# Patient Record
Sex: Female | Born: 1945 | Race: Black or African American | Hispanic: No | Marital: Single | Smoking: Current some day smoker
Health system: Southern US, Community
[De-identification: ages and names within clinical notes are randomized; demographics above are authoritative.]

## PROBLEM LIST (undated history)

## (undated) DIAGNOSIS — I2510 Atherosclerotic heart disease of native coronary artery without angina pectoris: Secondary | ICD-10-CM

## (undated) DIAGNOSIS — I6529 Occlusion and stenosis of unspecified carotid artery: Secondary | ICD-10-CM

## (undated) DIAGNOSIS — I219 Acute myocardial infarction, unspecified: Secondary | ICD-10-CM

## (undated) DIAGNOSIS — E119 Type 2 diabetes mellitus without complications: Secondary | ICD-10-CM

## (undated) DIAGNOSIS — E785 Hyperlipidemia, unspecified: Secondary | ICD-10-CM

## (undated) DIAGNOSIS — I10 Essential (primary) hypertension: Secondary | ICD-10-CM

## (undated) HISTORY — PX: CARDIAC CATHETERIZATION: SHX172

## (undated) HISTORY — DX: Acute myocardial infarction, unspecified: I21.9

## (undated) HISTORY — PX: ABDOMINAL HYSTERECTOMY: SHX81

## (undated) HISTORY — DX: Hyperlipidemia, unspecified: E78.5

## (undated) HISTORY — DX: Occlusion and stenosis of unspecified carotid artery: I65.29

## (undated) HISTORY — DX: Essential (primary) hypertension: I10

## (undated) HISTORY — DX: Type 2 diabetes mellitus without complications: E11.9

## (undated) HISTORY — DX: Atherosclerotic heart disease of native coronary artery without angina pectoris: I25.10

---

## 1995-02-06 DIAGNOSIS — I219 Acute myocardial infarction, unspecified: Secondary | ICD-10-CM

## 1995-02-06 HISTORY — DX: Acute myocardial infarction, unspecified: I21.9

## 1997-09-05 ENCOUNTER — Emergency Department (HOSPITAL_COMMUNITY): Admission: EM | Admit: 1997-09-05 | Discharge: 1997-09-05 | Payer: Self-pay | Admitting: Emergency Medicine

## 2003-02-01 ENCOUNTER — Emergency Department (HOSPITAL_COMMUNITY): Admission: EM | Admit: 2003-02-01 | Discharge: 2003-02-01 | Payer: Self-pay | Admitting: Emergency Medicine

## 2006-02-25 ENCOUNTER — Emergency Department (HOSPITAL_COMMUNITY): Admission: EM | Admit: 2006-02-25 | Discharge: 2006-02-25 | Payer: Self-pay | Admitting: Emergency Medicine

## 2006-04-08 ENCOUNTER — Ambulatory Visit (HOSPITAL_COMMUNITY): Admission: RE | Admit: 2006-04-08 | Discharge: 2006-04-08 | Payer: Self-pay | Admitting: Chiropractic Medicine

## 2007-01-09 ENCOUNTER — Emergency Department (HOSPITAL_COMMUNITY): Admission: EM | Admit: 2007-01-09 | Discharge: 2007-01-09 | Payer: Self-pay | Admitting: Emergency Medicine

## 2007-01-15 ENCOUNTER — Ambulatory Visit: Payer: Self-pay | Admitting: *Deleted

## 2007-05-13 ENCOUNTER — Encounter (INDEPENDENT_AMBULATORY_CARE_PROVIDER_SITE_OTHER): Payer: Self-pay | Admitting: Nurse Practitioner

## 2007-05-13 ENCOUNTER — Ambulatory Visit: Payer: Self-pay | Admitting: Family Medicine

## 2007-05-13 LAB — CONVERTED CEMR LAB
ALT: 9 units/L (ref 0–35)
AST: 14 units/L (ref 0–37)
Albumin: 4.5 g/dL (ref 3.5–5.2)
Alkaline Phosphatase: 47 units/L (ref 39–117)
BUN: 16 mg/dL (ref 6–23)
Basophils Absolute: 0 10*3/uL (ref 0.0–0.1)
Basophils Relative: 1 % (ref 0–1)
CK-MB: 1.7 ng/mL (ref 0.3–4.0)
CO2: 22 meq/L (ref 19–32)
Calcium: 9.7 mg/dL (ref 8.4–10.5)
Chloride: 104 meq/L (ref 96–112)
Creatinine, Ser: 0.74 mg/dL (ref 0.40–1.20)
Eosinophils Absolute: 0.1 10*3/uL (ref 0.0–0.7)
Eosinophils Relative: 1 % (ref 0–5)
Glucose, Bld: 96 mg/dL (ref 70–99)
HCT: 41.6 % (ref 36.0–46.0)
Hemoglobin: 13.7 g/dL (ref 12.0–15.0)
Lymphocytes Relative: 47 % — ABNORMAL HIGH (ref 12–46)
Lymphs Abs: 2.9 10*3/uL (ref 0.7–4.0)
MCHC: 32.9 g/dL (ref 30.0–36.0)
MCV: 85.8 fL (ref 78.0–100.0)
Monocytes Absolute: 0.4 10*3/uL (ref 0.1–1.0)
Monocytes Relative: 6 % (ref 3–12)
Neutro Abs: 2.7 10*3/uL (ref 1.7–7.7)
Neutrophils Relative %: 45 % (ref 43–77)
Platelets: 382 10*3/uL (ref 150–400)
Potassium: 4.1 meq/L (ref 3.5–5.3)
RBC: 4.85 M/uL (ref 3.87–5.11)
RDW: 14.8 % (ref 11.5–15.5)
Sodium: 139 meq/L (ref 135–145)
TSH: 0.895 microintl units/mL (ref 0.350–5.50)
Total Bilirubin: 0.2 mg/dL — ABNORMAL LOW (ref 0.3–1.2)
Total CK: 50 units/L (ref 7–177)
Total Protein: 8.1 g/dL (ref 6.0–8.3)
WBC: 6.1 10*3/uL (ref 4.0–10.5)

## 2007-05-28 ENCOUNTER — Ambulatory Visit (HOSPITAL_COMMUNITY): Admission: RE | Admit: 2007-05-28 | Discharge: 2007-05-28 | Payer: Self-pay | Admitting: Family Medicine

## 2007-05-28 ENCOUNTER — Ambulatory Visit: Payer: Self-pay | Admitting: Cardiology

## 2007-05-28 ENCOUNTER — Encounter (INDEPENDENT_AMBULATORY_CARE_PROVIDER_SITE_OTHER): Payer: Self-pay | Admitting: Family Medicine

## 2007-07-29 ENCOUNTER — Ambulatory Visit (HOSPITAL_COMMUNITY): Admission: RE | Admit: 2007-07-29 | Discharge: 2007-07-29 | Payer: Self-pay | Admitting: Family Medicine

## 2007-08-07 ENCOUNTER — Encounter: Admission: RE | Admit: 2007-08-07 | Discharge: 2007-08-07 | Payer: Self-pay | Admitting: Family Medicine

## 2007-08-20 ENCOUNTER — Ambulatory Visit: Payer: Self-pay | Admitting: Internal Medicine

## 2007-08-20 ENCOUNTER — Encounter (INDEPENDENT_AMBULATORY_CARE_PROVIDER_SITE_OTHER): Payer: Self-pay | Admitting: Family Medicine

## 2007-08-21 ENCOUNTER — Encounter: Payer: Self-pay | Admitting: Family Medicine

## 2007-09-02 ENCOUNTER — Ambulatory Visit: Payer: Self-pay | Admitting: Internal Medicine

## 2007-09-04 ENCOUNTER — Ambulatory Visit: Payer: Self-pay | Admitting: Internal Medicine

## 2007-09-22 ENCOUNTER — Ambulatory Visit: Payer: Self-pay | Admitting: Internal Medicine

## 2007-09-22 ENCOUNTER — Encounter (INDEPENDENT_AMBULATORY_CARE_PROVIDER_SITE_OTHER): Payer: Self-pay | Admitting: Family Medicine

## 2007-09-22 LAB — CONVERTED CEMR LAB
ALT: 11 units/L (ref 0–35)
AST: 12 units/L (ref 0–37)
Albumin: 4.4 g/dL (ref 3.5–5.2)
Alkaline Phosphatase: 39 units/L (ref 39–117)
BUN: 13 mg/dL (ref 6–23)
CO2: 24 meq/L (ref 19–32)
Calcium: 9.3 mg/dL (ref 8.4–10.5)
Chloride: 100 meq/L (ref 96–112)
Cholesterol: 272 mg/dL — ABNORMAL HIGH (ref 0–200)
Creatinine, Ser: 0.8 mg/dL (ref 0.40–1.20)
Glucose, Bld: 107 mg/dL — ABNORMAL HIGH (ref 70–99)
HDL: 35 mg/dL — ABNORMAL LOW (ref >39)
LDL Cholesterol: 199 mg/dL — ABNORMAL HIGH (ref 0–99)
Microalb, Ur: 0.55 mg/dL (ref 0.00–1.89)
Potassium: 4.2 meq/L (ref 3.5–5.3)
Sodium: 138 meq/L (ref 135–145)
Total Bilirubin: 0.4 mg/dL (ref 0.3–1.2)
Total CHOL/HDL Ratio: 7.8
Total Protein: 7.8 g/dL (ref 6.0–8.3)
Triglycerides: 188 mg/dL — ABNORMAL HIGH (ref <150)
VLDL: 38 mg/dL (ref 0–40)

## 2008-08-05 DEATH — deceased

## 2009-08-16 ENCOUNTER — Encounter: Admission: RE | Admit: 2009-08-16 | Discharge: 2009-08-16 | Payer: Self-pay | Admitting: Internal Medicine

## 2010-02-26 ENCOUNTER — Encounter: Payer: Self-pay | Admitting: Internal Medicine

## 2010-02-26 ENCOUNTER — Encounter: Payer: Self-pay | Admitting: Chiropractic Medicine

## 2010-04-05 ENCOUNTER — Other Ambulatory Visit: Payer: Self-pay | Admitting: Internal Medicine

## 2010-04-05 DIAGNOSIS — Z1231 Encounter for screening mammogram for malignant neoplasm of breast: Secondary | ICD-10-CM

## 2010-08-18 ENCOUNTER — Ambulatory Visit
Admission: RE | Admit: 2010-08-18 | Discharge: 2010-08-18 | Disposition: A | Discharge status: Home / Self Care | Payer: Medicaid Other | Source: Ambulatory Visit | Attending: Internal Medicine | Admitting: Internal Medicine

## 2010-08-18 DIAGNOSIS — Z1231 Encounter for screening mammogram for malignant neoplasm of breast: Secondary | ICD-10-CM

## 2010-11-13 LAB — I-STAT 8, (EC8 V) (CONVERTED LAB)
Acid-base deficit: 3 — ABNORMAL HIGH
BUN: 11
Bicarbonate: 22.7
Chloride: 108
Glucose, Bld: 111 — ABNORMAL HIGH
HCT: 44
Hemoglobin: 15
Operator id: 282201
Potassium: 3.5
Sodium: 142
TCO2: 24
pCO2, Ven: 40.2 — ABNORMAL LOW
pH, Ven: 7.361 — ABNORMAL HIGH

## 2010-11-13 LAB — POCT I-STAT CREATININE
Creatinine, Ser: 0.7
Operator id: 282201

## 2011-09-13 ENCOUNTER — Other Ambulatory Visit: Payer: Self-pay | Admitting: Internal Medicine

## 2011-09-13 DIAGNOSIS — Z1231 Encounter for screening mammogram for malignant neoplasm of breast: Secondary | ICD-10-CM

## 2011-09-28 ENCOUNTER — Ambulatory Visit
Admission: RE | Admit: 2011-09-28 | Discharge: 2011-09-28 | Disposition: A | Discharge status: Home / Self Care | Payer: PRIVATE HEALTH INSURANCE | Source: Ambulatory Visit | Attending: Internal Medicine | Admitting: Internal Medicine

## 2011-09-28 DIAGNOSIS — Z1231 Encounter for screening mammogram for malignant neoplasm of breast: Secondary | ICD-10-CM

## 2012-08-19 ENCOUNTER — Other Ambulatory Visit: Payer: Self-pay

## 2012-09-18 ENCOUNTER — Other Ambulatory Visit: Payer: Self-pay | Admitting: Internal Medicine

## 2012-09-18 DIAGNOSIS — Z1231 Encounter for screening mammogram for malignant neoplasm of breast: Secondary | ICD-10-CM

## 2012-09-29 ENCOUNTER — Ambulatory Visit
Admission: RE | Admit: 2012-09-29 | Discharge: 2012-09-29 | Disposition: A | Discharge status: Home / Self Care | Payer: PRIVATE HEALTH INSURANCE | Source: Ambulatory Visit | Attending: Internal Medicine | Admitting: Internal Medicine

## 2012-09-29 DIAGNOSIS — Z1231 Encounter for screening mammogram for malignant neoplasm of breast: Secondary | ICD-10-CM

## 2013-09-02 ENCOUNTER — Other Ambulatory Visit: Payer: Self-pay

## 2013-09-02 DIAGNOSIS — Z1231 Encounter for screening mammogram for malignant neoplasm of breast: Secondary | ICD-10-CM

## 2013-09-30 ENCOUNTER — Ambulatory Visit
Admission: RE | Admit: 2013-09-30 | Discharge: 2013-09-30 | Disposition: A | Discharge status: Home / Self Care | Payer: PRIVATE HEALTH INSURANCE | Source: Ambulatory Visit

## 2013-09-30 DIAGNOSIS — Z1231 Encounter for screening mammogram for malignant neoplasm of breast: Secondary | ICD-10-CM

## 2014-02-10 DIAGNOSIS — Z720 Tobacco use: Secondary | ICD-10-CM | POA: Diagnosis not present

## 2014-02-10 DIAGNOSIS — R05 Cough: Secondary | ICD-10-CM | POA: Diagnosis not present

## 2014-03-04 DIAGNOSIS — I10 Essential (primary) hypertension: Secondary | ICD-10-CM | POA: Diagnosis not present

## 2014-03-04 DIAGNOSIS — E139 Other specified diabetes mellitus without complications: Secondary | ICD-10-CM | POA: Diagnosis not present

## 2014-05-31 DIAGNOSIS — I10 Essential (primary) hypertension: Secondary | ICD-10-CM | POA: Diagnosis not present

## 2014-05-31 DIAGNOSIS — E139 Other specified diabetes mellitus without complications: Secondary | ICD-10-CM | POA: Diagnosis not present

## 2014-05-31 DIAGNOSIS — E785 Hyperlipidemia, unspecified: Secondary | ICD-10-CM | POA: Diagnosis not present

## 2014-09-01 ENCOUNTER — Other Ambulatory Visit: Payer: Self-pay

## 2014-09-01 DIAGNOSIS — Z1231 Encounter for screening mammogram for malignant neoplasm of breast: Secondary | ICD-10-CM

## 2014-10-07 ENCOUNTER — Ambulatory Visit
Admission: RE | Admit: 2014-10-07 | Discharge: 2014-10-07 | Disposition: A | Discharge status: Home / Self Care | Payer: Medicare Other | Source: Ambulatory Visit

## 2014-10-07 DIAGNOSIS — Z1231 Encounter for screening mammogram for malignant neoplasm of breast: Secondary | ICD-10-CM

## 2015-09-09 ENCOUNTER — Other Ambulatory Visit: Payer: Self-pay | Admitting: Family Medicine

## 2015-09-09 DIAGNOSIS — Z1231 Encounter for screening mammogram for malignant neoplasm of breast: Secondary | ICD-10-CM

## 2015-10-12 ENCOUNTER — Ambulatory Visit
Admission: RE | Admit: 2015-10-12 | Discharge: 2015-10-12 | Disposition: A | Discharge status: Home / Self Care | Payer: Medicare Other | Source: Ambulatory Visit | Attending: Family Medicine | Admitting: Family Medicine

## 2015-10-12 DIAGNOSIS — Z1231 Encounter for screening mammogram for malignant neoplasm of breast: Secondary | ICD-10-CM

## 2015-11-15 ENCOUNTER — Encounter: Payer: Self-pay | Admitting: Internal Medicine

## 2015-11-24 ENCOUNTER — Ambulatory Visit: Payer: Medicare Other | Admitting: Endocrinology

## 2016-02-29 ENCOUNTER — Ambulatory Visit (INDEPENDENT_AMBULATORY_CARE_PROVIDER_SITE_OTHER): Payer: Medicare Other | Admitting: Endocrinology

## 2016-02-29 ENCOUNTER — Encounter: Payer: Self-pay | Admitting: Endocrinology

## 2016-02-29 VITALS — BP 124/76 | HR 92 | Ht 66.0 in | Wt 142.0 lb

## 2016-02-29 DIAGNOSIS — E041 Nontoxic single thyroid nodule: Secondary | ICD-10-CM | POA: Diagnosis not present

## 2016-02-29 LAB — T4, FREE: Free T4: 0.75 ng/dL (ref 0.60–1.60)

## 2016-02-29 LAB — TSH: TSH: 1.31 u[IU]/mL (ref 0.35–4.50)

## 2016-02-29 NOTE — Progress Notes (Addendum)
Patient ID: Veronica Swanson, female   DOB: Jul 05, 1945, 71 y.o.   MRN: [id number]            Reason for Appointment: Evaluation of thyroid nodule    History of Present Illness:   The patient's thyroid nodule was first discovered when she was having a carotid ultrasound in 07/2015 She does not herself feel any swelling in her neck and difficulty swallowing or choking  She was referred for a thyroid ultrasound by her PCP at an outside facility because of abnormal findings   The thyroid ultrasound showed the following  There is a 2.9 cm dominant mostly solid nodule right-sided nodule present, also had a 9 mm nodule on the right side Left lobe normal  She has not had any thyroid levels done recently  Lab Results  Component Value Date   TSH 0.895 05/13/2007    Allergies as of 02/29/2016   Not on File     Medication List       Accurate as of 02/29/16  2:52 PM. Always use your most recent med list.          amLODipine 10 MG tablet Commonly known as:  NORVASC Take 10 mg by mouth every morning.   BAYER LOW DOSE 81 MG EC tablet Generic drug:  aspirin Take 81 mg by mouth daily. Swallow whole.   Ferrous Sulfate 28 MG Tabs Take 2 tablets by mouth daily.   lisinopril-hydrochlorothiazide 20-12.5 MG tablet Commonly known as:  PRINZIDE,ZESTORETIC Take 1 tablet by mouth. Take 1 tablet twice daily   lovastatin 40 MG tablet Commonly known as:  MEVACOR 40 mg daily.   metFORMIN 500 MG tablet Commonly known as:  GLUCOPHAGE 500 mg. 1 tablet in the morning and 1 tablet in the evening   niacin 500 MG CR tablet Commonly known as:  NIASPAN 500 mg. Take 1 tablet at night with food       Allergies: Not on File  Past Medical History:  Diagnosis Date  . Diabetes mellitus without complication (HCC)   . Hypertension   . Myocardial infarction 1997    There is no history of radiation to the neck in childhood  Past Surgical History:  Procedure Laterality Date  . ABDOMINAL  HYSTERECTOMY      Family History  Problem Relation Age of Onset  . Heart disease Mother   . Hypertension Father   . Heart disease Father   . Thyroid disease Neg Hx     Social History:  has no tobacco, alcohol, and drug history on file.   Review of Systems:  No history of recent weight change  No unusual fatigue, heat or cold intolerance  There is a history of high blood pressure.             She has a  history of Diabetes.  last A1c on metformin was 6.3     GI: No change in bowel habits  Up to date with  mammogram screenings         Examination:   BP 124/76   Pulse 92   Ht 5\' 6"  (1.676 m)   Wt 142 lb (64.4 kg)   SpO2 97%   BMI 22.92 kg/m    General Appearance:  she looks well        Eyes: No  prominence or swelling of the eyes         THYROID: Thyroid nodule is not palpable, has a indistinct fullness in the left medial lobe  area inferiorly Left lobe not palpable  There is no lymphadenopathy in the neck  Heart sounds normal Lungs clear Abdomen exam not indicated Reflexes at biceps are normal.  Skin: No rash or lesions Extremities: No edema  Assessment/Plan:  Thyroid nodule: She has a dominant nodule measuring 2.9 cm although clinically not able to palpated This was evaluated over 6 months ago She is asymptomatic Baseline thyroid levels not available  Recommendations: She will have thyroid functions drawn today Will repeat thyroid ultrasound and decide on further management based on this  DIABETES: This is followed by her PCP and is historically well controlled with low-dose metformin    Veronica Swanson 02/29/2016  Addendum: Thyroid levels are normal and thyroid ultrasound has been ordered

## 2016-02-29 NOTE — Addendum Note (Signed)
Addended by: Reather LittlerKUMAR, Tenicia Gural on: 02/29/2016 09:28 PM   Modules accepted: Orders

## 2016-08-28 ENCOUNTER — Ambulatory Visit: Payer: Self-pay | Admitting: Endocrinology

## 2016-08-29 ENCOUNTER — Telehealth: Payer: Self-pay | Admitting: *Deleted

## 2016-08-29 NOTE — Telephone Encounter (Signed)
Pt states she was calling to get set up for an ultrasound. I reviewed our clinicals and pt had not been seen in our office. I informed pt we would need to get her established with a doctor and evaluate for testing and treatment. Pt states understanding and I transferred to schedulers.

## 2016-09-05 ENCOUNTER — Ambulatory Visit
Admission: RE | Admit: 2016-09-05 | Discharge: 2016-09-05 | Disposition: A | Discharge status: Home / Self Care | Payer: Medicare Other | Source: Ambulatory Visit | Attending: Endocrinology | Admitting: Endocrinology

## 2016-09-05 DIAGNOSIS — E041 Nontoxic single thyroid nodule: Secondary | ICD-10-CM

## 2016-09-05 NOTE — Progress Notes (Signed)
Please call to let patient know that the thyroid nodule is the same size as a year ago and no further action needed She needs to set up an appointment to come back in one year for follow-up with repeat ultrasound

## 2016-09-24 ENCOUNTER — Telehealth: Payer: Self-pay | Admitting: Endocrinology

## 2016-09-24 NOTE — Telephone Encounter (Signed)
Patient wants a call from supervisor at [phone] to discuss no show fee for her 7/24 visit. Patient states she was at the office. Call patient to advise.

## 2016-09-28 NOTE — Telephone Encounter (Signed)
Spoke with the pt and let her know I will have the ns fee removed and apologized for the mistake. She was appreciative and understood.

## 2016-10-01 ENCOUNTER — Other Ambulatory Visit: Payer: Self-pay | Admitting: Specialist

## 2016-10-01 DIAGNOSIS — Z1231 Encounter for screening mammogram for malignant neoplasm of breast: Secondary | ICD-10-CM

## 2016-10-17 ENCOUNTER — Ambulatory Visit
Admission: RE | Admit: 2016-10-17 | Discharge: 2016-10-17 | Disposition: A | Discharge status: Home / Self Care | Payer: Medicare Other | Source: Ambulatory Visit | Attending: Specialist | Admitting: Specialist

## 2016-10-17 DIAGNOSIS — Z1231 Encounter for screening mammogram for malignant neoplasm of breast: Secondary | ICD-10-CM

## 2017-09-26 ENCOUNTER — Other Ambulatory Visit: Payer: Self-pay | Admitting: Specialist

## 2017-09-26 DIAGNOSIS — Z1231 Encounter for screening mammogram for malignant neoplasm of breast: Secondary | ICD-10-CM

## 2017-10-25 ENCOUNTER — Ambulatory Visit
Admission: RE | Admit: 2017-10-25 | Discharge: 2017-10-25 | Disposition: A | Discharge status: Home / Self Care | Payer: Medicare HMO | Source: Ambulatory Visit | Attending: Specialist | Admitting: Specialist

## 2017-10-25 DIAGNOSIS — Z1231 Encounter for screening mammogram for malignant neoplasm of breast: Secondary | ICD-10-CM

## 2017-12-09 ENCOUNTER — Encounter: Payer: Self-pay | Admitting: Nurse Practitioner

## 2017-12-09 ENCOUNTER — Ambulatory Visit (INDEPENDENT_AMBULATORY_CARE_PROVIDER_SITE_OTHER): Payer: Medicare HMO | Admitting: Nurse Practitioner

## 2017-12-09 VITALS — BP 110/76 | HR 84 | Temp 98.3°F | Ht 66.0 in | Wt 146.8 lb

## 2017-12-09 DIAGNOSIS — E119 Type 2 diabetes mellitus without complications: Secondary | ICD-10-CM | POA: Diagnosis not present

## 2017-12-09 DIAGNOSIS — Z7982 Long term (current) use of aspirin: Secondary | ICD-10-CM

## 2017-12-09 DIAGNOSIS — I10 Essential (primary) hypertension: Secondary | ICD-10-CM | POA: Diagnosis not present

## 2017-12-09 DIAGNOSIS — I252 Old myocardial infarction: Secondary | ICD-10-CM | POA: Diagnosis not present

## 2017-12-09 DIAGNOSIS — Z720 Tobacco use: Secondary | ICD-10-CM

## 2017-12-09 DIAGNOSIS — Z8249 Family history of ischemic heart disease and other diseases of the circulatory system: Secondary | ICD-10-CM

## 2017-12-09 DIAGNOSIS — Z13228 Encounter for screening for other metabolic disorders: Secondary | ICD-10-CM | POA: Diagnosis not present

## 2017-12-09 LAB — POCT URINALYSIS DIPSTICK
BILIRUBIN UA: NEGATIVE
Glucose, UA: NEGATIVE
KETONES UA: NEGATIVE
Leukocytes, UA: NEGATIVE
Nitrite, UA: NEGATIVE
PH UA: 5.5 (ref 5.0–8.0)
Protein, UA: NEGATIVE
RBC UA: NEGATIVE
SPEC GRAV UA: 1.015 (ref 1.010–1.025)
UROBILINOGEN UA: 0.2 U/dL

## 2017-12-09 LAB — POCT UA - MICROALBUMIN
CREATININE, POC: 200 mg/dL
MICROALBUMIN (UR) POC: 10 mg/L

## 2017-12-09 NOTE — Addendum Note (Signed)
Addended by: Benjamine Mola on: 12/09/2017 04:15 PM   Modules accepted: Orders

## 2017-12-09 NOTE — Progress Notes (Signed)
Subjective:     Patient ID: Veronica Swanson , female    DOB: [birth year] , 72 y.o.   MRN: [id number]   Chief Complaint  Patient presents with  . New Patient (Initial Visit)    HPI  Here to establish care - had been going to [facility] but has changed due to feeling like waiting a long time.  Seen at least 3 months ago.  She is originally from [facility].  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  Nurse from insurance did Medicare visit.    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   Smoking history - greater than 50 years, now down to 1 cigarette daily.  Single.    Hypertension  This is a chronic problem. The current episode started more than 1 year ago. Progression since onset: first visit to this office. The problem is controlled. Pertinent negatives include no anxiety or malaise/fatigue. There are no associated agents to hypertension. There are no known risk factors for coronary artery disease. Past treatments include ACE inhibitors.     Past Medical History:  Diagnosis Date  . Diabetes mellitus without complication (HCC)   . Hypertension   . Myocardial infarction (HCC) 1997     Family History  Problem Relation Age of Onset  . Heart disease Mother   . Hypertension Father   . Heart disease Father   . Thyroid disease Neg Hx      Current Outpatient Medications:  .  amLODipine (NORVASC) 10 MG tablet, Take 10 mg by mouth every morning. , Disp: , Rfl:  .  amLODipine (NORVASC) 5 MG tablet, Take 5 mg by mouth daily., Disp: , Rfl: 1 .  aspirin (BAYER LOW DOSE) 81 MG EC tablet, Take 81 mg by mouth daily. Swallow whole., Disp: , Rfl:  .  Ferrous Sulfate 28 MG TABS, Take 2 tablets by mouth daily., Disp: , Rfl:  .  ipratropium (ATROVENT) 0.03 % nasal spray, , Disp: , Rfl:  .  lisinopril-hydrochlorothiazide (PRINZIDE,ZESTORETIC) 20-12.5 MG tablet, Take 1 tablet by mouth. Take 1 tablet twice daily, Disp: , Rfl:  .  lovastatin (MEVACOR) 40 MG  tablet, 40 mg daily. , Disp: , Rfl:  .  rosuvastatin (CRESTOR) 10 MG tablet, Take 10 mg by mouth daily., Disp: , Rfl: 1 .  SYMBICORT 160-4.5 MCG/ACT inhaler, , Disp: , Rfl:  .  VENTOLIN HFA 108 (90 Base) MCG/ACT inhaler, Inhale 2 puffs by mouth every 4-6 hours for wheeze,cough or shortness of breath, Disp: , Rfl: 2   Allergies  Allergen Reactions  . Penicillins     Dizzy      Review of Systems  Constitutional: Negative.  Negative for malaise/fatigue.  Respiratory: Negative.   Cardiovascular: Negative.   Genitourinary: Negative.   Musculoskeletal: Negative.   Neurological: Negative.      Today's Vitals   12/09/17 1025  BP: 110/76  Pulse: 84  Temp: 98.3 F (36.8 C)  TempSrc: Oral  SpO2: 97%  Weight: 146 lb 12.8 oz (66.6 kg)  Height: 5\' 6"  (1.676 m)   Body mass index is 23.69 kg/m.   Objective:  Physical Exam  Constitutional: She is oriented to person, place, and time. She appears well-developed and well-nourished.  Cardiovascular: Normal rate, regular rhythm and normal heart sounds.  Neurological: She is alert and oriented to person, place, and time.  Skin: Skin is warm and dry. Capillary refill takes less than 2 seconds.  Assessment And Plan:     1. Essential hypertension  Chronic, controlled  Continue with current medications - CMP14 + Anion Gap - CBC with Differential/Platelet  2. Type 2 diabetes mellitus without complication, without long-term current use of insulin (HCC)  Chronic, she tells me she has been treated before but not taking any medications now.    Will check HgbA1c.   No current medications  - Hemoglobin A1c  3. History of MI (myocardial infarction)   4. Encounter for screening for other metabolic disorders - Lipid Profile      Arnette Felts, FNP

## 2017-12-10 LAB — CBC WITH DIFFERENTIAL/PLATELET
BASOS: 1 %
Basophils Absolute: 0 10*3/uL (ref 0.0–0.2)
EOS (ABSOLUTE): 0.1 10*3/uL (ref 0.0–0.4)
EOS: 1 %
Hematocrit: 33.7 % — ABNORMAL LOW (ref 34.0–46.6)
Hemoglobin: 11.2 g/dL (ref 11.1–15.9)
IMMATURE GRANULOCYTES: 0 %
Immature Grans (Abs): 0 10*3/uL (ref 0.0–0.1)
LYMPHS: 44 %
Lymphocytes Absolute: 2.5 10*3/uL (ref 0.7–3.1)
MCH: 27.1 pg (ref 26.6–33.0)
MCHC: 33.2 g/dL (ref 31.5–35.7)
MCV: 82 fL (ref 79–97)
MONOCYTES: 8 %
Monocytes Absolute: 0.5 10*3/uL (ref 0.1–0.9)
NEUTROS ABS: 2.7 10*3/uL (ref 1.4–7.0)
Neutrophils: 46 %
PLATELETS: 319 10*3/uL (ref 150–450)
RBC: 4.13 x10E6/uL (ref 3.77–5.28)
RDW: 14.1 % (ref 12.3–15.4)
WBC: 5.8 10*3/uL (ref 3.4–10.8)

## 2017-12-10 LAB — CMP14 + ANION GAP
ALBUMIN: 4.1 g/dL (ref 3.5–4.8)
ALT: 10 IU/L (ref 0–32)
ANION GAP: 16 mmol/L (ref 10.0–18.0)
AST: 13 IU/L (ref 0–40)
Albumin/Globulin Ratio: 1.4 (ref 1.2–2.2)
Alkaline Phosphatase: 39 IU/L (ref 39–117)
BUN/Creatinine Ratio: 15 (ref 12–28)
BUN: 21 mg/dL (ref 8–27)
Bilirubin Total: <0.2 mg/dL (ref 0.0–1.2)
CALCIUM: 9.2 mg/dL (ref 8.7–10.3)
CHLORIDE: 103 mmol/L (ref 96–106)
CO2: 21 mmol/L (ref 20–29)
CREATININE: 1.38 mg/dL — AB (ref 0.57–1.00)
GFR, EST AFRICAN AMERICAN: 44 mL/min/{1.73_m2} — AB (ref >59)
GFR, EST NON AFRICAN AMERICAN: 38 mL/min/{1.73_m2} — AB (ref >59)
GLUCOSE: 94 mg/dL (ref 65–99)
Globulin, Total: 3 g/dL (ref 1.5–4.5)
Potassium: 4.4 mmol/L (ref 3.5–5.2)
Sodium: 140 mmol/L (ref 134–144)
TOTAL PROTEIN: 7.1 g/dL (ref 6.0–8.5)

## 2017-12-10 LAB — LIPID PANEL
CHOLESTEROL TOTAL: 180 mg/dL (ref 100–199)
Chol/HDL Ratio: 4.4 ratio (ref 0.0–4.4)
HDL: 41 mg/dL (ref >39)
LDL Calculated: 108 mg/dL — ABNORMAL HIGH (ref 0–99)
TRIGLYCERIDES: 153 mg/dL — AB (ref 0–149)
VLDL Cholesterol Cal: 31 mg/dL (ref 5–40)

## 2017-12-10 LAB — HEMOGLOBIN A1C
Est. average glucose Bld gHb Est-mCnc: 126 mg/dL
Hgb A1c MFr Bld: 6 % — ABNORMAL HIGH (ref 4.8–5.6)

## 2017-12-26 ENCOUNTER — Ambulatory Visit: Payer: Medicare HMO

## 2018-01-14 LAB — COLOGUARD: Cologuard: POSITIVE — AB

## 2018-01-16 ENCOUNTER — Other Ambulatory Visit: Payer: Self-pay | Admitting: Nurse Practitioner

## 2018-01-16 ENCOUNTER — Telehealth: Payer: Self-pay | Admitting: Nurse Practitioner

## 2018-01-16 DIAGNOSIS — R195 Other fecal abnormalities: Secondary | ICD-10-CM

## 2018-01-16 LAB — COLOGUARD: Cologuard: POSITIVE — AB

## 2018-01-16 NOTE — Telephone Encounter (Signed)
Called patient to make aware her cologuard was positive and I have placed a referral for GI.

## 2018-01-31 ENCOUNTER — Encounter: Payer: Self-pay | Admitting: Nurse Practitioner

## 2018-02-13 ENCOUNTER — Other Ambulatory Visit: Payer: Self-pay | Admitting: Internal Medicine

## 2018-02-13 DIAGNOSIS — Z13820 Encounter for screening for osteoporosis: Secondary | ICD-10-CM

## 2018-02-28 ENCOUNTER — Encounter: Payer: Self-pay | Admitting: Nurse Practitioner

## 2018-02-28 ENCOUNTER — Telehealth: Payer: Self-pay

## 2018-02-28 NOTE — Telephone Encounter (Signed)
Called pt to advise her that we have referred her to a GI specialist to have a colonoscopy due to her having a positive cologuard. Pt stated she has an appointment scheduled. YRL,RMA

## 2018-03-12 ENCOUNTER — Ambulatory Visit: Payer: Medicare HMO | Admitting: Nurse Practitioner

## 2018-04-03 ENCOUNTER — Telehealth: Payer: Self-pay | Admitting: Nurse Practitioner

## 2018-04-03 NOTE — Telephone Encounter (Signed)
Called to schedule Medicare Annual Wellness Visit with the Nurse Health Advisor. Patient states she is seeing Dr. Odetta Pink at [facility] on [address]. In [place], [place]. Thank you!  Trixie Rude at [phone] or Skype [email or web address]

## 2018-04-08 ENCOUNTER — Ambulatory Visit
Admission: RE | Admit: 2018-04-08 | Discharge: 2018-04-08 | Disposition: A | Discharge status: Home / Self Care | Payer: Medicare HMO | Source: Ambulatory Visit | Attending: Internal Medicine | Admitting: Internal Medicine

## 2018-04-08 DIAGNOSIS — Z13820 Encounter for screening for osteoporosis: Secondary | ICD-10-CM

## 2018-04-15 ENCOUNTER — Other Ambulatory Visit: Payer: Self-pay

## 2018-04-15 MED ORDER — AMLODIPINE BESYLATE 10 MG PO TABS: 10.0000 mg | ORAL_TABLET | Freq: Every morning | ORAL | 0 refills | Status: DC

## 2018-04-24 ENCOUNTER — Encounter: Payer: Self-pay | Admitting: Internal Medicine

## 2018-05-19 ENCOUNTER — Ambulatory Visit (INDEPENDENT_AMBULATORY_CARE_PROVIDER_SITE_OTHER): Payer: Medicare Other | Admitting: Cardiology

## 2018-05-19 ENCOUNTER — Encounter: Payer: Self-pay | Admitting: Cardiology

## 2018-05-19 ENCOUNTER — Other Ambulatory Visit: Payer: Self-pay

## 2018-05-19 VITALS — BP 120/75 | HR 85

## 2018-05-19 DIAGNOSIS — E785 Hyperlipidemia, unspecified: Secondary | ICD-10-CM | POA: Diagnosis not present

## 2018-05-19 DIAGNOSIS — I10 Essential (primary) hypertension: Secondary | ICD-10-CM | POA: Diagnosis not present

## 2018-05-19 DIAGNOSIS — I6523 Occlusion and stenosis of bilateral carotid arteries: Secondary | ICD-10-CM

## 2018-05-19 DIAGNOSIS — I2510 Atherosclerotic heart disease of native coronary artery without angina pectoris: Secondary | ICD-10-CM | POA: Diagnosis not present

## 2018-05-19 DIAGNOSIS — Z87891 Personal history of nicotine dependence: Secondary | ICD-10-CM

## 2018-05-19 MED ORDER — ROSUVASTATIN CALCIUM 20 MG PO TABS: 20.0000 mg | ORAL_TABLET | Freq: Every day | ORAL | 3 refills | Status: DC

## 2018-05-19 NOTE — Progress Notes (Signed)
Subjective:   Veronica Swanson, female    DOB: March 25, 1945, 73 y.o.   MRN: [id number]  Tsosie Billing, MD:  Chief Complaint  Patient presents with  . Hypertension   This visit type was conducted due to national recommendations for restrictions regarding the COVID-19 Pandemic (e.g. social distancing).  This format is felt to be most appropriate for this patient at this time.  All issues noted in this document were discussed and addressed.  No physical exam was performed (except for noted visual exam findings with Telehealth visits).  The patient has consented to conduct a Telehealth visit and understands insurance will be billed.   I discussed the limitations of evaluation and management by telemedicine and the availability of in person appointments. The patient expressed understanding and agreed to proceed.  Virtual Visit via Telephone is as below.  I connected with Ms. Willieams, on 05/19/18 at 45 by telephone and verified that I am speaking with the correct person using two identifiers. Unable to perform video visit as patient did not have equipment.    I have discussed with her regarding the safety during COVID Pandemic and steps and precautions including social distancing with the patient.    HPI: Veronica Swanson  is a 73 y.o. female  with HTN, hyperlipidemia, and CAD with stent to the mid circumflex in 1997.  This is a 6 month follow up visit for HTN and CAD. No complaints today. Blood pressure has been well controlled and she is tolerating medications well. She denies any chest pain or shortness of breath. No palpitations or leg edema. Had normal echo in Oct 2019. Carotid duplex in Oct 2019 showed mild bilateral carotid stenosis.   She was taking metformin at one point, but was taken off of it a year ago and her most recent HbgA1c is 5.9%.   She states that she walks all day and believes to be very active. She recently quit smoking 1 month ago.    Past  Medical History:  Diagnosis Date  . Diabetes mellitus without complication ([place])   . Hypertension   . Myocardial infarction ([place]) 1997    Past Surgical History:  Procedure Laterality Date  . ABDOMINAL HYSTERECTOMY      Family History  Problem Relation Age of Onset  . Heart disease Mother   . Hypertension Father   . Heart disease Father   . Thyroid disease Neg Hx     Social History   Socioeconomic History  . Marital status: Single    Spouse name: Not on file  . Number of children: Not on file  . Years of education: Not on file  . Highest education level: Not on file  Occupational History  . Not on file  Social Needs  . Financial resource strain: Not on file  . Food insecurity:    Worry: Not on file    Inability: Not on file  . Transportation needs:    Medical: Not on file    Non-medical: Not on file  Tobacco Use  . Smoking status: Former Smoker    Types: Cigarettes    Last attempt to quit: 04/18/2018    Years since quitting: 0.0  . Smokeless tobacco: Never Used  Substance and Sexual Activity  . Alcohol use: Never    Frequency: Never  . Drug use: Never  . Sexual activity: Not on file  Lifestyle  . Physical activity:    Days per week: Not on file  Minutes per session: Not on file  . Stress: Not on file  Relationships  . Social connections:    Talks on phone: Not on file    Gets together: Not on file    Attends religious service: Not on file    Active member of club or organization: Not on file    Attends meetings of clubs or organizations: Not on file    Relationship status: Not on file  . Intimate partner violence:    Fear of current or ex partner: Not on file    Emotionally abused: Not on file    Physically abused: Not on file    Forced sexual activity: Not on file  Other Topics Concern  . Not on file  Social History Narrative  . Not on file    Current Meds  Medication Sig  . amLODipine (NORVASC) 10 MG tablet Take 1 tablet (10 mg total) by  mouth every morning.  Marland Kitchen aspirin (BAYER LOW DOSE) 81 MG EC tablet Take 81 mg by mouth daily. Swallow whole.  . ipratropium (ATROVENT) 0.03 % nasal spray   . losartan-hydrochlorothiazide (HYZAAR) 50-12.5 MG tablet Take 1 tablet by mouth daily.  . Multiple Vitamin (MULTIVITAMIN WITH MINERALS) TABS tablet Take 1 tablet by mouth daily.  . [DISCONTINUED] rosuvastatin (CRESTOR) 10 MG tablet Take 10 mg by mouth daily.     Review of Systems  Constitution: Negative for decreased appetite, malaise/fatigue, weight gain and weight loss.  Eyes: Negative for visual disturbance.  Cardiovascular: Negative for chest pain, claudication, dyspnea on exertion, leg swelling, orthopnea, palpitations and syncope.  Respiratory: Negative for hemoptysis and wheezing.   Endocrine: Negative for cold intolerance and heat intolerance.  Hematologic/Lymphatic: Does not bruise/bleed easily.  Skin: Negative for nail changes.  Musculoskeletal: Negative for muscle weakness and myalgias.  Gastrointestinal: Negative for abdominal pain, change in bowel habit, nausea and vomiting.  Neurological: Negative for difficulty with concentration, dizziness, focal weakness and headaches.  Psychiatric/Behavioral: Negative for altered mental status and suicidal ideas.  All other systems reviewed and are negative.      Objective:     Blood pressure 120/75, pulse 85.  Cardiac studies:  Echo- 11/13/2017 1. Left ventricle cavity is normal in size. Mild concentric hypertrophy of the left ventricle. Normal global wall motion. Visual EF is 60-65%. Doppler evidence of grade I (impaired) diastolic dysfunction. 2. Mild (Grade I) mitral regurgitation. Mild prolapse of posterior mitral valve leaflet. 3. Trace tricuspid regurgitation  Carotid artery duplex 11/13/2017: Mild stenosis in the right common carotid artery (<50%). Mild stenosis in the left external carotid artery (<50%). Antegrade right vertebral artery flow. Antegrade left  vertebral artery flow. Follow up in one year is appropriate if clinically indicated.  Recent Labs:  CBC    Component Value Date/Time   WBC 5.8 12/09/2017 1142   WBC 6.1 05/13/2007 2018   RBC 4.13 12/09/2017 1142   RBC 4.85 05/13/2007 2018   HGB 11.2 12/09/2017 1142   HCT 33.7 (L) 12/09/2017 1142   PLT 319 12/09/2017 1142   MCV 82 12/09/2017 1142   MCH 27.1 12/09/2017 1142   MCHC 33.2 12/09/2017 1142   MCHC 32.9 05/13/2007 2018   RDW 14.1 12/09/2017 1142   LYMPHSABS 2.5 12/09/2017 1142   MONOABS 0.4 05/13/2007 2018   EOSABS 0.1 12/09/2017 1142   BASOSABS 0.0 12/09/2017 1142   CMP     Component Value Date/Time   NA 140 12/09/2017 1142   K 4.4 12/09/2017 1142   CL 103 12/09/2017  1142   CO2 21 12/09/2017 1142   GLUCOSE 94 12/09/2017 1142   GLUCOSE 107 (H) 09/22/2007 2153   BUN 21 12/09/2017 1142   CREATININE 1.38 (H) 12/09/2017 1142   CALCIUM 9.2 12/09/2017 1142   PROT 7.1 12/09/2017 1142   ALBUMIN 4.1 12/09/2017 1142   AST 13 12/09/2017 1142   ALT 10 12/09/2017 1142   ALKPHOS 39 12/09/2017 1142   BILITOT <0.2 12/09/2017 1142   GFRNONAA 38 (L) 12/09/2017 1142   GFRAA 44 (L) 12/09/2017 1142   Lipid Panel     Component Value Date/Time   CHOL 180 12/09/2017 1142   TRIG 153 (H) 12/09/2017 1142   HDL 41 12/09/2017 1142   CHOLHDL 4.4 12/09/2017 1142   CHOLHDL 7.8 Ratio 09/22/2007 2153   VLDL 38 09/22/2007 2153   LDLCALC 108 (H) 12/09/2017 1142    03/11/2017: Cholesterol 162, triglycerides 93, HDL 49, LDL 94.  Creatinine 1.79, EGFR 28/32, potassium 3.9, alkaline phosphatase 30, CMP otherwise normal.  Hemoglobin A1c 5.9%.  TSH 0.35.  CBC normal.  Hepatitis panel reactive for hepatitis B core Antibody.  *Physical exam not performed as this was a telephone visit*        Assessment & Recommendations:  1. Essential hypertension Well controlled on current medical therapy. Continue with current antihypertensive therapy.  2. Atherosclerosis of native coronary artery  of native heart without angina pectoris No symptoms of angina. On appropriate medical therapy. Continue with risk factor modification.  3. Hyperlipidemia LDL goal <70 I have reviewed labs from PCP office in Nov 2019, LDL was not at goal given her CAD history. I will increase her Crestor up to 43m daily. Will order repeat lipids and CMP in 8 weeks for follow up. Will notify her of results over the phone.   4. Former tobacco use She has recently quit smoking 1 month ago. I have congratulated her on this. Encouraged her to continue the good work. Discussed diet changes to help with increased appetite.  5. Bilateral carotid artery stenosis Had mild (<50%) by carotid duplex in Oct 2019. Will consider repeating in 1 year. Continue with ASA and statin therapy.   Plan: As patient is presently doing well, I will see her back in 1 year or sooner if problems.    AJeri Lager MSN, APRN, FNP-C PALPharetta Eye Surgery CenterCardiovascular, PLoveland ParkOffice: [phone]: [phone]

## 2018-06-02 ENCOUNTER — Other Ambulatory Visit: Payer: Self-pay | Admitting: Cardiology

## 2018-06-02 DIAGNOSIS — R0989 Other specified symptoms and signs involving the circulatory and respiratory systems: Secondary | ICD-10-CM

## 2018-07-15 LAB — COMPREHENSIVE METABOLIC PANEL
ALT: 22 IU/L (ref 0–32)
AST: 19 IU/L (ref 0–40)
Albumin/Globulin Ratio: 1.5 (ref 1.2–2.2)
Albumin: 4.4 g/dL (ref 3.7–4.7)
Alkaline Phosphatase: 42 IU/L (ref 39–117)
BUN/Creatinine Ratio: 14 (ref 12–28)
BUN: 20 mg/dL (ref 8–27)
Bilirubin Total: 0.2 mg/dL (ref 0.0–1.2)
CO2: 24 mmol/L (ref 20–29)
Calcium: 9.5 mg/dL (ref 8.7–10.3)
Chloride: 104 mmol/L (ref 96–106)
Creatinine, Ser: 1.4 mg/dL — ABNORMAL HIGH (ref 0.57–1.00)
GFR calc Af Amer: 43 mL/min/{1.73_m2} — ABNORMAL LOW (ref >59)
GFR calc non Af Amer: 37 mL/min/{1.73_m2} — ABNORMAL LOW (ref >59)
Globulin, Total: 3 g/dL (ref 1.5–4.5)
Glucose: 106 mg/dL — ABNORMAL HIGH (ref 65–99)
Potassium: 4.5 mmol/L (ref 3.5–5.2)
Sodium: 141 mmol/L (ref 134–144)
Total Protein: 7.4 g/dL (ref 6.0–8.5)

## 2018-07-15 LAB — LIPID PANEL
Chol/HDL Ratio: 3.6 ratio (ref 0.0–4.4)
Cholesterol, Total: 146 mg/dL (ref 100–199)
HDL: 41 mg/dL (ref >39)
LDL Calculated: 77 mg/dL (ref 0–99)
Triglycerides: 140 mg/dL (ref 0–149)
VLDL Cholesterol Cal: 28 mg/dL (ref 5–40)

## 2018-07-24 ENCOUNTER — Other Ambulatory Visit: Payer: Self-pay

## 2018-07-24 MED ORDER — AMLODIPINE BESYLATE 10 MG PO TABS: 10.0000 mg | ORAL_TABLET | Freq: Every morning | ORAL | 1 refills | Status: DC

## 2018-10-20 ENCOUNTER — Other Ambulatory Visit: Payer: Self-pay

## 2018-10-20 DIAGNOSIS — I10 Essential (primary) hypertension: Secondary | ICD-10-CM

## 2018-10-20 MED ORDER — AMLODIPINE BESYLATE 10 MG PO TABS: 10.0000 mg | ORAL_TABLET | Freq: Every morning | ORAL | 1 refills | Status: DC

## 2018-11-19 ENCOUNTER — Other Ambulatory Visit: Payer: Self-pay

## 2018-11-19 ENCOUNTER — Ambulatory Visit: Payer: Medicare Other

## 2018-11-19 DIAGNOSIS — R0989 Other specified symptoms and signs involving the circulatory and respiratory systems: Secondary | ICD-10-CM | POA: Diagnosis not present

## 2018-11-23 ENCOUNTER — Other Ambulatory Visit: Payer: Self-pay | Admitting: Cardiology

## 2018-11-23 DIAGNOSIS — I6523 Occlusion and stenosis of bilateral carotid arteries: Secondary | ICD-10-CM

## 2018-11-24 NOTE — Progress Notes (Signed)
Mild stenosis bilateral carotid arteries, will recheck in 1 year and set  up OV after the carotid duplex with AK

## 2018-11-26 NOTE — Progress Notes (Signed)
Gave patient results. She verbalized understanding.

## 2018-12-09 ENCOUNTER — Other Ambulatory Visit: Payer: Self-pay | Admitting: Family Medicine

## 2018-12-09 DIAGNOSIS — Z1231 Encounter for screening mammogram for malignant neoplasm of breast: Secondary | ICD-10-CM

## 2018-12-11 ENCOUNTER — Other Ambulatory Visit: Payer: Self-pay

## 2018-12-11 ENCOUNTER — Ambulatory Visit
Admission: RE | Admit: 2018-12-11 | Discharge: 2018-12-11 | Disposition: A | Discharge status: Home / Self Care | Payer: Medicare Other | Source: Ambulatory Visit | Attending: Family | Admitting: Family

## 2018-12-11 DIAGNOSIS — Z1231 Encounter for screening mammogram for malignant neoplasm of breast: Secondary | ICD-10-CM

## 2019-03-11 DIAGNOSIS — H40013 Open angle with borderline findings, low risk, bilateral: Secondary | ICD-10-CM | POA: Diagnosis not present

## 2019-03-23 DIAGNOSIS — E559 Vitamin D deficiency, unspecified: Secondary | ICD-10-CM | POA: Diagnosis not present

## 2019-03-23 DIAGNOSIS — N1832 Chronic kidney disease, stage 3b: Secondary | ICD-10-CM | POA: Diagnosis not present

## 2019-03-23 DIAGNOSIS — Z79899 Other long term (current) drug therapy: Secondary | ICD-10-CM | POA: Diagnosis not present

## 2019-03-23 DIAGNOSIS — E119 Type 2 diabetes mellitus without complications: Secondary | ICD-10-CM | POA: Diagnosis not present

## 2019-03-23 DIAGNOSIS — Z1159 Encounter for screening for other viral diseases: Secondary | ICD-10-CM | POA: Diagnosis not present

## 2019-03-23 DIAGNOSIS — N183 Chronic kidney disease, stage 3 unspecified: Secondary | ICD-10-CM | POA: Diagnosis not present

## 2019-03-23 DIAGNOSIS — E785 Hyperlipidemia, unspecified: Secondary | ICD-10-CM | POA: Diagnosis not present

## 2019-03-23 DIAGNOSIS — I130 Hypertensive heart and chronic kidney disease with heart failure and stage 1 through stage 4 chronic kidney disease, or unspecified chronic kidney disease: Secondary | ICD-10-CM | POA: Diagnosis not present

## 2019-04-02 ENCOUNTER — Other Ambulatory Visit: Payer: Self-pay | Admitting: Cardiology

## 2019-04-06 ENCOUNTER — Other Ambulatory Visit: Payer: Self-pay | Admitting: Cardiology

## 2019-10-01 ENCOUNTER — Other Ambulatory Visit: Payer: Self-pay | Admitting: General Practice

## 2019-10-01 DIAGNOSIS — Z87891 Personal history of nicotine dependence: Secondary | ICD-10-CM

## 2019-10-14 ENCOUNTER — Inpatient Hospital Stay: Admission: RE | Admit: 2019-10-14 | Payer: Medicare Other | Source: Ambulatory Visit

## 2019-11-06 ENCOUNTER — Other Ambulatory Visit: Payer: Medicare Other

## 2019-11-09 ENCOUNTER — Other Ambulatory Visit: Payer: Self-pay

## 2019-11-09 ENCOUNTER — Ambulatory Visit: Payer: Medicare Other

## 2019-11-09 DIAGNOSIS — I6523 Occlusion and stenosis of bilateral carotid arteries: Secondary | ICD-10-CM

## 2019-11-10 ENCOUNTER — Other Ambulatory Visit: Payer: Self-pay | Admitting: Cardiology

## 2019-11-10 DIAGNOSIS — I6523 Occlusion and stenosis of bilateral carotid arteries: Secondary | ICD-10-CM

## 2019-11-10 NOTE — Progress Notes (Signed)
Your patient 

## 2019-12-04 ENCOUNTER — Ambulatory Visit: Payer: Medicare Other | Admitting: Cardiology

## 2019-12-04 ENCOUNTER — Encounter: Payer: Self-pay | Admitting: Cardiology

## 2019-12-04 ENCOUNTER — Other Ambulatory Visit: Payer: Self-pay

## 2019-12-04 VITALS — BP 146/79 | HR 91 | Ht 66.0 in | Wt 167.0 lb

## 2019-12-04 DIAGNOSIS — Z8249 Family history of ischemic heart disease and other diseases of the circulatory system: Secondary | ICD-10-CM

## 2019-12-04 DIAGNOSIS — I10 Essential (primary) hypertension: Secondary | ICD-10-CM

## 2019-12-04 DIAGNOSIS — F17200 Nicotine dependence, unspecified, uncomplicated: Secondary | ICD-10-CM

## 2019-12-04 DIAGNOSIS — Z955 Presence of coronary angioplasty implant and graft: Secondary | ICD-10-CM

## 2019-12-04 DIAGNOSIS — I6523 Occlusion and stenosis of bilateral carotid arteries: Secondary | ICD-10-CM

## 2019-12-04 DIAGNOSIS — I2510 Atherosclerotic heart disease of native coronary artery without angina pectoris: Secondary | ICD-10-CM

## 2019-12-04 DIAGNOSIS — E785 Hyperlipidemia, unspecified: Secondary | ICD-10-CM

## 2019-12-04 NOTE — Progress Notes (Signed)
Veronica Swanson Date of Birth: [birth year] MRN: [id number] Primary Care Provider:Smith, Malva Limes., FNP Former Cardiology Providers: Jeri Lager, APRN, FNP-C Primary Cardiologist: Rex Kras, DO, [facility] (established care 12/04/2019)  Date: 12/04/19 Last Visit: 05/19/2018.  Chief Complaint  Patient presents with  . Follow-up    Carotid artery disease and heart disease management    HPI  Veronica Swanson is a 74 y.o.  female who presents to the office with a chief complaint of " management of carotid artery disease and heart disease." Patient's past medical history and cardiovascular risk factors include: Hypertension, hyperlipidemia, CAD with prior PCI to LCx in 1997, active smoker, family history of premature CAD, postmenopausal female, advanced age.  She was formally being followed by Miquel Dunn and now is here to reestablish care she is being followed for the management of CAD and carotid artery atherosclerosis.  Since last office visit patient is doing well from a cardiovascular standpoint.  She denies any hospitalizations or urgent care visits for cardiovascular symptoms.  No new complaints of chest pain or shortness of breath at rest or with activities.  Patient continues to walk on a daily basis but has to rest using her walker.  She ambulates for approximately 1 mile each day and no change in overall physical endurance over the last 1 year.  Unfortunately, she started to smoke 1 pack/week.  Since last office visit patient had a carotid duplex to reevaluate carotid artery atherosclerosis.  She had a carotid duplex in October 2021 which noted less than 50% stenosis of left ICA.  Family history of premature CAD: Mom died at age of 65 due a MI.   FUNCTIONAL STATUS: Walks everyday about a mile.    ALLERGIES: Allergies  Allergen Reactions  . Penicillins Other (See Comments)    Dizzy    MEDICATION LIST PRIOR TO VISIT: Current Outpatient Medications on File Prior to Visit   Medication Sig Dispense Refill  . amLODipine (NORVASC) 10 MG tablet Take 1 tablet (10 mg total) by mouth every morning. 90 tablet 1  . aspirin (BAYER LOW DOSE) 81 MG EC tablet Take 81 mg by mouth daily. Swallow whole.    . fluticasone (FLONASE) 50 MCG/ACT nasal spray Place 2 sprays into both nostrils daily.    Marland Kitchen ipratropium (ATROVENT) 0.03 % nasal spray     . losartan-hydrochlorothiazide (HYZAAR) 50-12.5 MG tablet TAKE 1 TABLET BY MOUTH EVERY DAY 90 tablet 3  . Multiple Vitamin (MULTIVITAMIN WITH MINERALS) TABS tablet Take 1 tablet by mouth daily.    . rosuvastatin (CRESTOR) 20 MG tablet TAKE 1 TABLET BY MOUTH EVERY DAY 90 tablet 0  . VENTOLIN HFA 108 (90 Base) MCG/ACT inhaler Inhale 2 puffs by mouth every 4-6 hours for wheeze,cough or shortness of breath  2   No current facility-administered medications on file prior to visit.    PAST MEDICAL HISTORY: Past Medical History:  Diagnosis Date  . Carotid artery occlusion   . Coronary artery disease   . Hyperlipidemia   . Hypertension   . Myocardial infarction [facility]) 1997    PAST SURGICAL HISTORY: Past Surgical History:  Procedure Laterality Date  . ABDOMINAL HYSTERECTOMY    . CARDIAC CATHETERIZATION      FAMILY HISTORY: The patient's family history includes Heart disease in her father and mother; Hypertension in her father.   SOCIAL HISTORY:  The patient  reports that she has been smoking cigarettes. She has never used smokeless tobacco. She reports that she does not drink  alcohol and does not use drugs.  Review of Systems  Constitutional: Negative for chills and fever.  HENT: Negative for hoarse voice and nosebleeds.   Eyes: Negative for discharge, double vision and pain.  Cardiovascular: Negative for chest pain, claudication, dyspnea on exertion, leg swelling, near-syncope, orthopnea, palpitations, paroxysmal nocturnal dyspnea and syncope.  Respiratory: Negative for hemoptysis and shortness of breath.   Musculoskeletal:  Negative for muscle cramps and myalgias.  Gastrointestinal: Negative for abdominal pain, constipation, diarrhea, hematemesis, hematochezia, melena, nausea and vomiting.  Neurological: Negative for dizziness and light-headedness.   PHYSICAL EXAM: Vitals with BMI 12/04/2019 05/19/2018 12/09/2017  Height 5' 6" - 5' 6"  Weight 167 lbs - 146 lbs 13 oz  BMI 74.82 - 70.78  Systolic 675 449 201  Diastolic 79 75 76  Pulse 91 85 84    CONSTITUTIONAL: Well-developed and well-nourished. No acute distress.  SKIN: Skin is warm and dry. No rash noted. No cyanosis. No pallor. No jaundice HEAD: Normocephalic and atraumatic.  EYES: No scleral icterus MOUTH/THROAT: Moist oral membranes.  NECK: No JVD present. No thyromegaly noted. Left carotid bruits  LYMPHATIC: No visible cervical adenopathy.  CHEST Normal respiratory effort. No intercostal retractions  LUNGS: Clear to auscultation bilaterally. No stridor. No wheezes. No rales.  CARDIOVASCULAR: Regular rate and rhythm, positive S1-S2, no murmurs rubs or gallops appreciated. ABDOMINAL: No apparent ascites.  EXTREMITIES: No peripheral edema  HEMATOLOGIC: No significant bruising NEUROLOGIC: Oriented to person, place, and time. Nonfocal. Normal muscle tone.  PSYCHIATRIC: Normal mood and affect. Normal behavior. Cooperative  CARDIAC DATABASE: EKG: 12/04/2019: Normal sinus rhythm, 86 bpm, normal axis, without underlying injury pattern.  Echocardiogram: 11/13/2017 1. Left ventricle cavity is normal in size. Mild concentric hypertrophy of the left ventricle. Normal global wall motion. Visual EF is 60-65%. Doppler evidence of grade I (impaired) diastolic dysfunction. 2. Mild (Grade I) mitral regurgitation. Mild prolapse of posterior mitral valve leaflet. 3. Trace tricuspid regurgitation  Stress Testing:  No results found for this or any previous visit from the past 1095 days.  Heart Catheterization: None  Carotid artery duplex 11/09/2019:  Mild  stenosis in the right internal carotid artery (1-15%). Stenosis in the right common carotid artery (<50%).  Stenosis in the left internal carotid artery (16-49%). Stenosis in the left common carotid artery (<50%).  Stenosis in the left external carotid artery (<50%).  Homogenous plaque bilateral carotid arteries.  Antegrade right vertebral artery flow. Antegrade left vertebral artery flow.  Follow up in one year is appropriate if clinically indicated. No significant change from 11/19/2018.  LABORATORY DATA: CBC Latest Ref Rng & Units 12/09/2017 05/13/2007 01/09/2007  WBC 3.4 - 10.8 x10E3/uL 5.8 6.1 -  Hemoglobin 11.1 - 15.9 g/dL 11.2 13.7 15.0  Hematocrit 34.0 - 46.6 % 33.7(L) 41.6 44.0  Platelets 150 - 450 x10E3/uL 319 382 -    CMP Latest Ref Rng & Units 07/14/2018 12/09/2017 09/22/2007  Glucose 65 - 99 mg/dL 106(H) 94 107(H)  BUN 8 - 27 mg/dL _0 Creatinine 0.57 - 1.00 mg/dL 1.40(H) 1.38(H) 0.80  Sodium 134 - 144 mmol/L 141 140 138  Potassium 3.5 - 5.2 mmol/L 4.5 4.4 4.2  Chloride 96 - 106 mmol/L 104 103 100  CO2 20 - 29 mmol/L _1 Calcium 8.7 - 10.3 mg/dL 9.5 9.2 9.3  Total Protein 6.0 - 8.5 g/dL 7.4 7.1 7.8  Total Bilirubin 0.0 - 1.2 mg/dL 0.2 <0.2 0.4  Alkaline Phos 39 - 117 IU/L 42 39 39  AST 0 - 40 IU/L _0 ALT 0 - 32 IU/L _1 Lipid Panel     Component Value Date/Time   CHOL 146 07/14/2018 1127   TRIG 140 07/14/2018 1127   HDL 41 07/14/2018 1127   CHOLHDL 3.6 07/14/2018 1127   CHOLHDL 7.8 Ratio 09/22/2007 2153   VLDL 38 09/22/2007 2153   LDLCALC 77 07/14/2018 1127   LABVLDL 28 07/14/2018 1127    Lab Results  Component Value Date   HGBA1C 6.0 (H) 12/09/2017   No components found for: NTPROBNP Lab Results  Component Value Date   TSH 1.31 02/29/2016   TSH 0.895 05/13/2007    Cardiac Panel (last 3 results) No results for input(s): CKTOTAL, CKMB, TROPONINIHS, RELINDX in the last 72 hours.  IMPRESSION:    ICD-10-CM   1. Asymptomatic  bilateral carotid artery stenosis  I65.23 EKG 12-Lead    Lipid Panel With LDL/HDL Ratio    LDL cholesterol, direct    CANCELED: PCV MYOCARDIAL PERFUSION WO LEXISCAN    CANCELED: SARS-COV-2 RNA,(COVID-19) QUAL NAAT  2. Atherosclerosis of native coronary artery of native heart without angina pectoris  I25.10 Lipid Panel With LDL/HDL Ratio    LDL cholesterol, direct    PCV MYOCARDIAL PERFUSION WITH LEXISCAN    CANCELED: PCV MYOCARDIAL PERFUSION WO LEXISCAN    CANCELED: SARS-COV-2 RNA,(COVID-19) QUAL NAAT  3. History of LCx PCI  Z95.5 Lipid Panel With LDL/HDL Ratio    LDL cholesterol, direct    PCV MYOCARDIAL PERFUSION WITH LEXISCAN  4. Essential hypertension  I10   5. Hyperlipidemia LDL goal <70  E78.5 Lipid Panel With LDL/HDL Ratio    LDL cholesterol, direct    CMP14+EGFR  6. Smoking  F17.200   7. Family history of premature CAD  Z82.49      RECOMMENDATIONS: Veronica Swanson is a 75 y.o. female whose past medical history and cardiovascular risk factors include: Hypertension, hyperlipidemia, CAD with prior PCI to LCx in 1997, active smoker, family history of premature CAD, postmenopausal female, advanced age.  Established coronary artery disease with prior PCI to the LCx and without angina pectoris:  Patient denies any chest pain or anginal equivalent since last office visit.  Her last echocardiogram was in 2019 which noted preserved LVEF and grade 1 diastolic impairment.  She has not had an ischemic evaluation since 1997 when she had a PCI to the LCx.  Nuclear stress test recommended to evaluate for reversible ischemia.  Carotid artery atherosclerosis:  Reviewed the results of the most recent carotid duplex with the patient.  She continues to have left-sided carotid disease which is noted to be less than 50%.  1 year follow-up duplex recommended and ordered.  Continue aspirin 81 mg p.o. daily.  Continue Crestor 20 mg p.o. nightly.  We will repeat lipid profile to reevaluate,  per patient's request  Benign essential hypertension:  Office blood pressure within acceptable range but not at goal.  Patient is asked to keep a log of her blood pressures and to review it with her PCP at the upcoming office visit.  Continue current hypertensive medication.  Low-salt diet recommended.  Hyperlipidemia:  Continue Crestor 20 mg p.o. nightly.  We will repeat lipid profile to reevaluate, per patient's request  Patient states that she has the office with with her PCP in December at [place].  She is asked to have a copy of her labs faxed over to the office for review.  FINAL  MEDICATION LIST END OF ENCOUNTER: No orders of the defined types were placed in this encounter.   There are no discontinued medications.   Current Outpatient Medications:  .  amLODipine (NORVASC) 10 MG tablet, Take 1 tablet (10 mg total) by mouth every morning., Disp: 90 tablet, Rfl: 1 .  aspirin (BAYER LOW DOSE) 81 MG EC tablet, Take 81 mg by mouth daily. Swallow whole., Disp: , Rfl:  .  fluticasone (FLONASE) 50 MCG/ACT nasal spray, Place 2 sprays into both nostrils daily., Disp: , Rfl:  .  ipratropium (ATROVENT) 0.03 % nasal spray, , Disp: , Rfl:  .  losartan-hydrochlorothiazide (HYZAAR) 50-12.5 MG tablet, TAKE 1 TABLET BY MOUTH EVERY DAY, Disp: 90 tablet, Rfl: 3 .  Multiple Vitamin (MULTIVITAMIN WITH MINERALS) TABS tablet, Take 1 tablet by mouth daily., Disp: , Rfl:  .  rosuvastatin (CRESTOR) 20 MG tablet, TAKE 1 TABLET BY MOUTH EVERY DAY, Disp: 90 tablet, Rfl: 0 .  VENTOLIN HFA 108 (90 Base) MCG/ACT inhaler, Inhale 2 puffs by mouth every 4-6 hours for wheeze,cough or shortness of breath, Disp: , Rfl: 2  Orders Placed This Encounter  Procedures  . Lipid Panel With LDL/HDL Ratio  . LDL cholesterol, direct  . CMP14+EGFR  . PCV MYOCARDIAL PERFUSION WITH LEXISCAN  . EKG 12-Lead    --Continue cardiac medications as reconciled in final medication list. --Return in about 7  weeks (around 01/22/2020) for Reevaluation of carotid disease. , CAD, Review test results. Or sooner if needed. --Continue follow-up with your primary care physician regarding the management of your other chronic comorbid conditions.  Patient's questions and concerns were addressed to her satisfaction. She voices understanding of the instructions provided during this encounter.   This note was created using a voice recognition software as a result there may be grammatical errors inadvertently enclosed that do not reflect the nature of this encounter. Every attempt is made to correct such errors.  Rex Kras, [place], [facility]  Pager: [phone] Office: [phone]

## 2019-12-08 LAB — LIPID PANEL WITH LDL/HDL RATIO
Cholesterol, Total: 139 mg/dL (ref 100–199)
HDL: 35 mg/dL — ABNORMAL LOW (ref >39)
LDL Chol Calc (NIH): 78 mg/dL (ref 0–99)
LDL/HDL Ratio: 2.2 ratio (ref 0.0–3.2)
Triglycerides: 146 mg/dL (ref 0–149)
VLDL Cholesterol Cal: 26 mg/dL (ref 5–40)

## 2019-12-08 LAB — CMP14+EGFR
ALT: 11 IU/L (ref 0–32)
AST: 18 IU/L (ref 0–40)
Albumin/Globulin Ratio: 1.4 (ref 1.2–2.2)
Albumin: 4.6 g/dL (ref 3.7–4.7)
Alkaline Phosphatase: 41 IU/L — ABNORMAL LOW (ref 44–121)
BUN/Creatinine Ratio: 16 (ref 12–28)
BUN: 20 mg/dL (ref 8–27)
Bilirubin Total: <0.2 mg/dL (ref 0.0–1.2)
CO2: 26 mmol/L (ref 20–29)
Calcium: 9.6 mg/dL (ref 8.7–10.3)
Chloride: 100 mmol/L (ref 96–106)
Creatinine, Ser: 1.27 mg/dL — ABNORMAL HIGH (ref 0.57–1.00)
GFR calc Af Amer: 48 mL/min/{1.73_m2} — ABNORMAL LOW (ref >59)
GFR calc non Af Amer: 41 mL/min/{1.73_m2} — ABNORMAL LOW (ref >59)
Globulin, Total: 3.2 g/dL (ref 1.5–4.5)
Glucose: 109 mg/dL — ABNORMAL HIGH (ref 65–99)
Potassium: 4.3 mmol/L (ref 3.5–5.2)
Sodium: 141 mmol/L (ref 134–144)
Total Protein: 7.8 g/dL (ref 6.0–8.5)

## 2019-12-08 LAB — LDL CHOLESTEROL, DIRECT: LDL Direct: 70 mg/dL (ref 0–99)

## 2019-12-09 NOTE — Progress Notes (Signed)
Called pt to inform her about message above. Pt understood.

## 2019-12-10 IMAGING — MG DIGITAL SCREENING BILATERAL MAMMOGRAM WITH TOMO AND CAD
8 series · 8 of 24 positions shown · non-contrast
Comparison: Previous exam(s).

CLINICAL DATA: Screening.

EXAM:
DIGITAL SCREENING BILATERAL MAMMOGRAM WITH TOMO AND CAD

[R CC synth-2D]
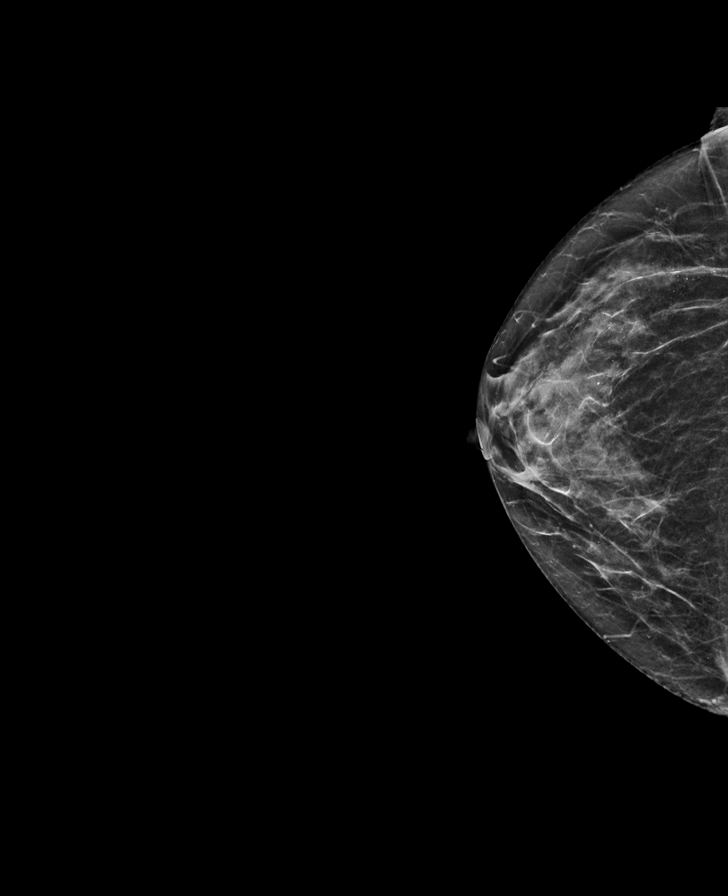

[R MLO synth-2D]
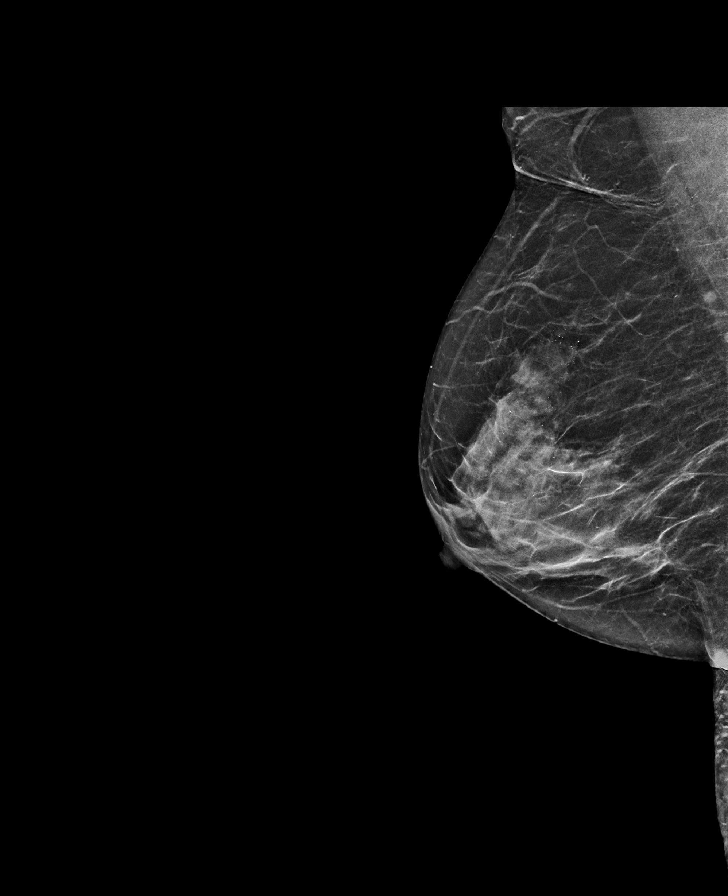

[L MLO synth-2D]
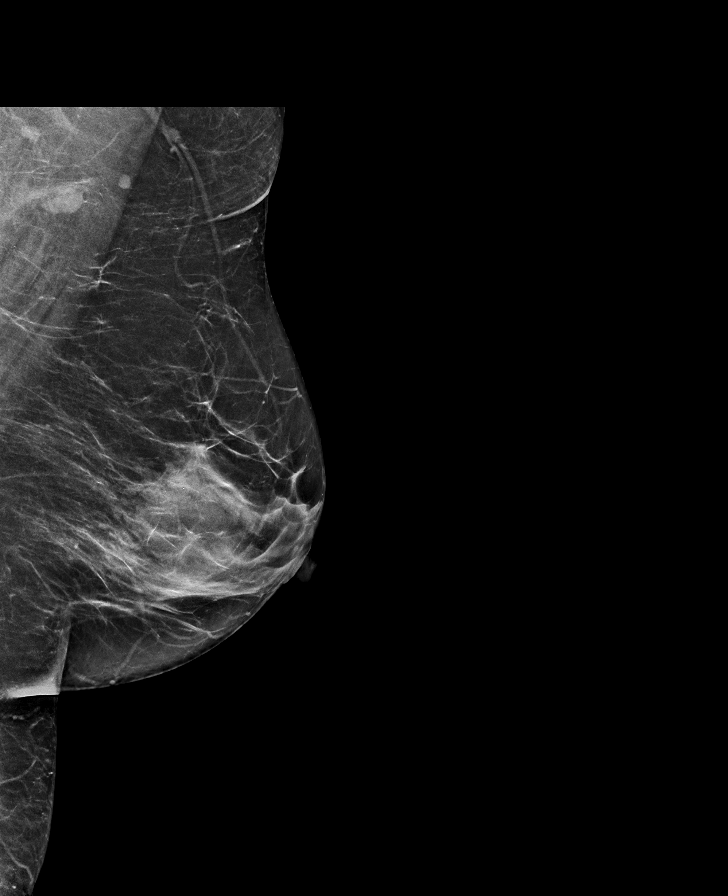

[L CC synth-2D]
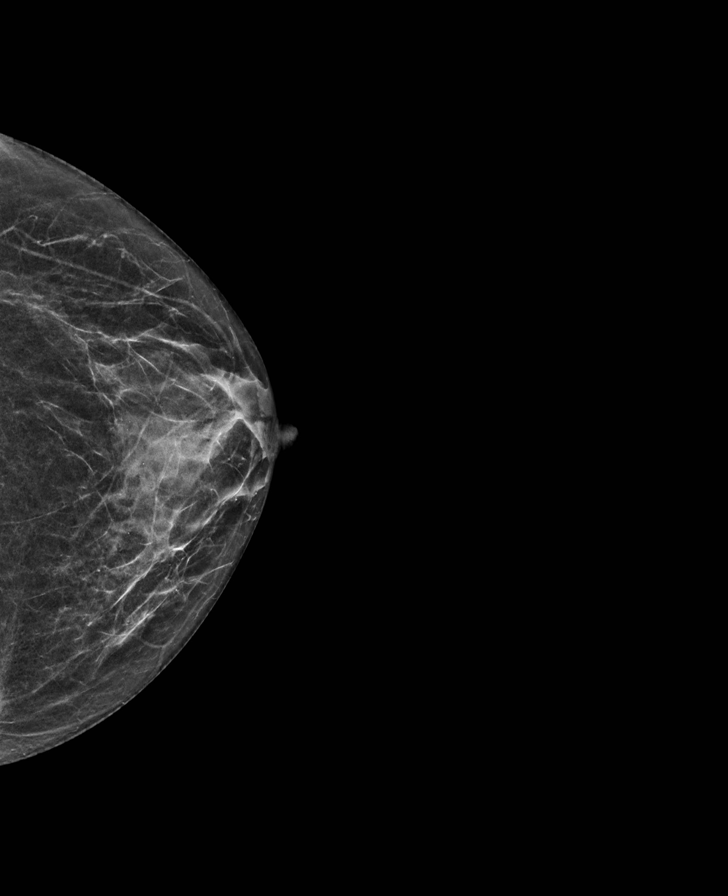

[R CC tomo · tomo slice 25/50.0]
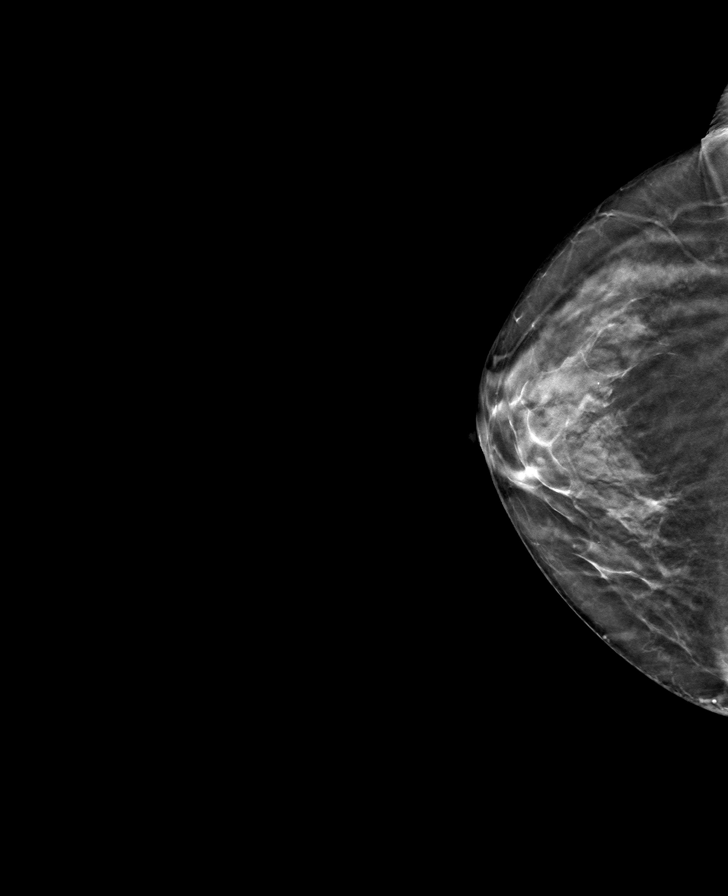

[R MLO tomo · tomo slice 28/55.0]
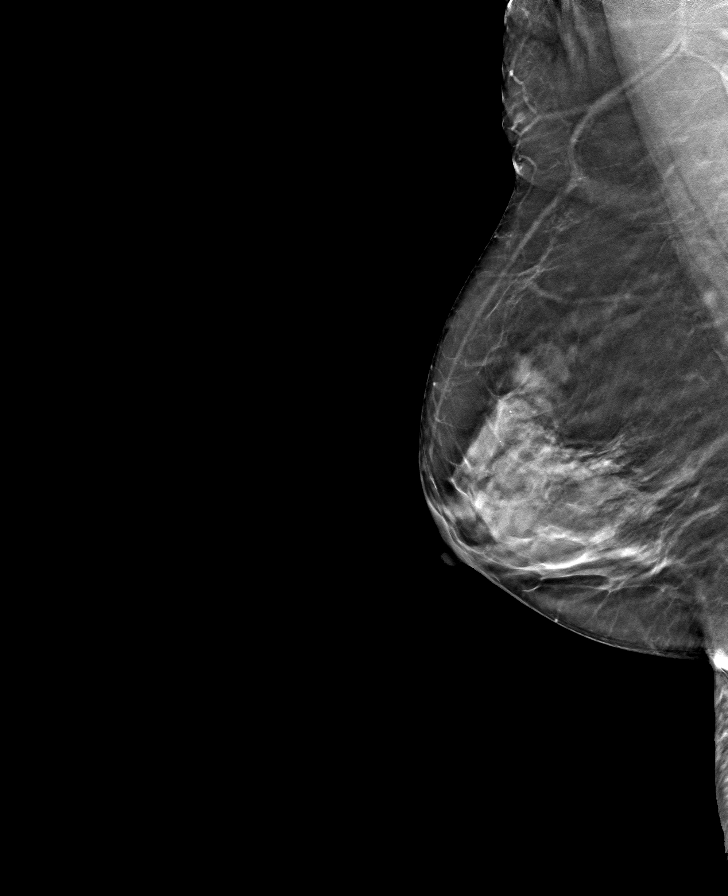

[L MLO tomo · tomo slice 33/64.0]
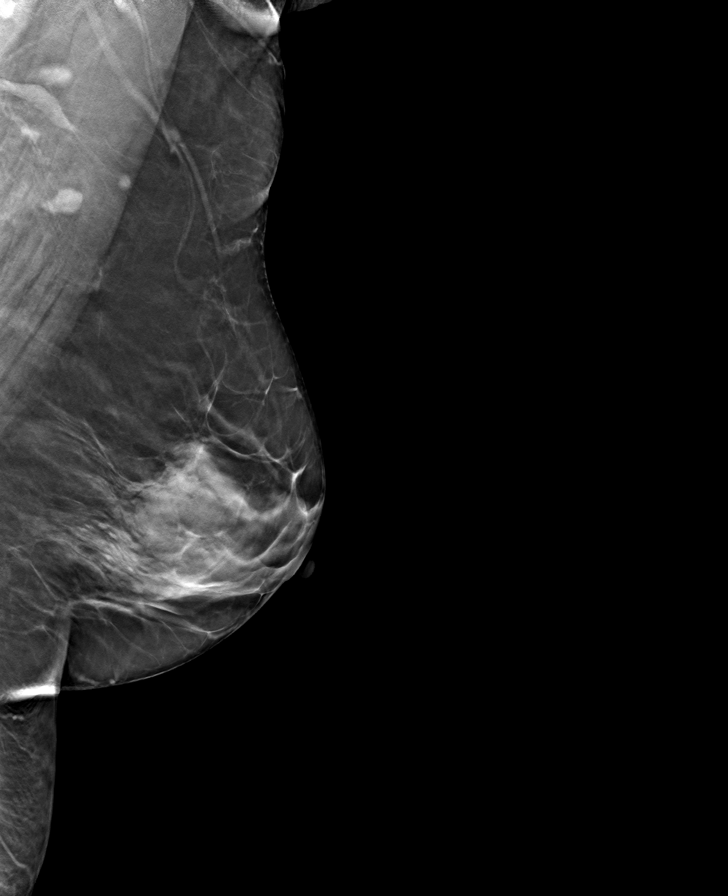

[L CC tomo · tomo slice 27/52.0]
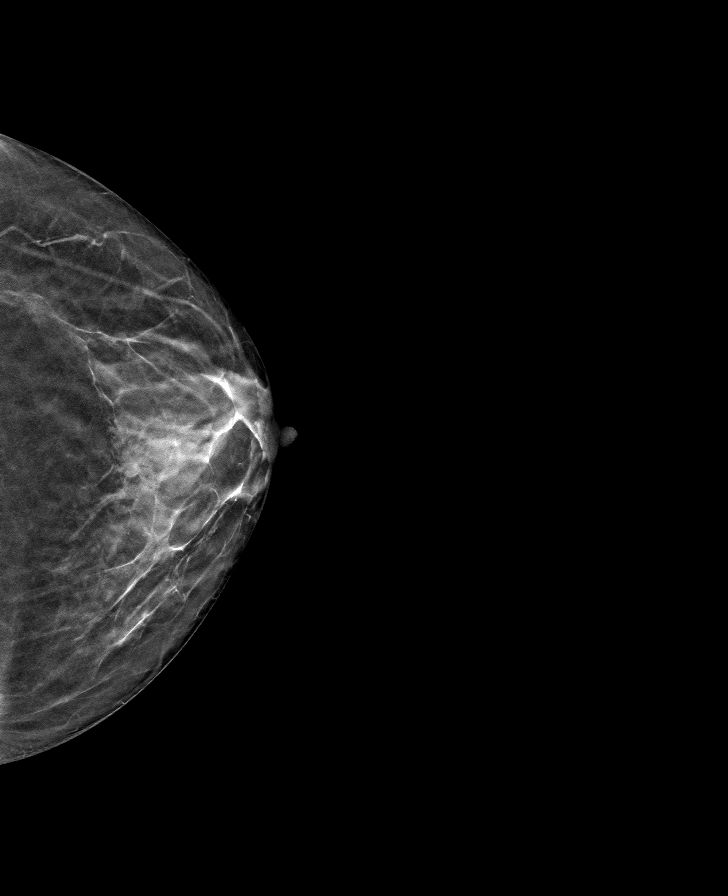

[8 of 24 positions shown; findings below may reference images not displayed]

ACR Breast Density Category c: The breast tissue is heterogeneously
dense, which may obscure small masses.
FINDINGS: There are no findings suspicious for malignancy. Images were
processed with CAD.
IMPRESSION: No mammographic evidence of malignancy. A result letter of this
screening mammogram will be mailed directly to the patient.

RECOMMENDATION:
Screening mammogram in one year. (Code:[id number])

BI-RADS CATEGORY  1: Negative.

## 2020-01-07 ENCOUNTER — Other Ambulatory Visit: Payer: Self-pay | Admitting: General Practice

## 2020-01-07 DIAGNOSIS — Z1231 Encounter for screening mammogram for malignant neoplasm of breast: Secondary | ICD-10-CM

## 2020-01-25 ENCOUNTER — Ambulatory Visit: Payer: Medicare Other | Admitting: Cardiology

## 2020-02-04 ENCOUNTER — Ambulatory Visit: Payer: Medicare Other | Admitting: Cardiology

## 2020-02-04 ENCOUNTER — Other Ambulatory Visit: Payer: Self-pay

## 2020-02-04 ENCOUNTER — Encounter: Payer: Self-pay | Admitting: Cardiology

## 2020-02-04 VITALS — BP 120/67 | HR 85 | Ht 66.0 in | Wt 160.0 lb

## 2020-02-04 DIAGNOSIS — I10 Essential (primary) hypertension: Secondary | ICD-10-CM

## 2020-02-04 DIAGNOSIS — F17200 Nicotine dependence, unspecified, uncomplicated: Secondary | ICD-10-CM

## 2020-02-04 DIAGNOSIS — E785 Hyperlipidemia, unspecified: Secondary | ICD-10-CM

## 2020-02-04 DIAGNOSIS — I2510 Atherosclerotic heart disease of native coronary artery without angina pectoris: Secondary | ICD-10-CM

## 2020-02-04 DIAGNOSIS — Z8249 Family history of ischemic heart disease and other diseases of the circulatory system: Secondary | ICD-10-CM

## 2020-02-04 DIAGNOSIS — I6523 Occlusion and stenosis of bilateral carotid arteries: Secondary | ICD-10-CM

## 2020-02-04 DIAGNOSIS — Z955 Presence of coronary angioplasty implant and graft: Secondary | ICD-10-CM

## 2020-02-04 NOTE — Progress Notes (Signed)
Veronica Swanson Date of Birth: March 22, 1945 MRN: [id number] Primary Care Provider:Smith, Malva Limes., FNP Former Cardiology Providers: Jeri Lager, APRN, FNP-C Primary Cardiologist: Rex Kras, DO, [facility] (established care 12/04/2019)  Date: 02/04/20 Last Office Visit: 12/04/2019  Chief Complaint  Patient presents with  . Coronary Artery Disease  . Follow-up  . carotid artery disease    HPI  Veronica Swanson is a 74 y.o.  female who presents to the office with a chief complaint of " follow-up for carotid and CAD management." Patient's past medical history and cardiovascular risk factors include: Hypertension, hyperlipidemia, CAD with prior PCI to LCx in 1997, active smoker, family history of premature CAD, postmenopausal female, advanced age.  She is followed by our practice for the management of CAD and carotid artery atherosclerosis.  Since last office visit she is doing well from a cardiovascular standpoint.  She denies any chest pain or anginal equivalent.  No recent hospitalizations or urgent care visits for cardiovascular symptoms.  At the last visit the shared decision was to proceed with stress test given her history of CAD and prior PCI 1997.  However, patient did not follow through as she was concerned that she had to walk on a treadmill and is unable to do so as she walks with a walker on a regular basis.  She still smoke approximately 2 cigarettes/day despite extensive education during last office visit.  Given her history of carotid artery disease she is asymptomatic does not have any visual changes or focal neurological deficits.  Continue aspirin and statin therapy.  She has a follow-up carotid ultrasound in October 2022.  Family history of premature CAD: Mom died at age of 81 due a MI.   FUNCTIONAL STATUS: Walks everyday about a mile with a walker.    ALLERGIES: Allergies  Allergen Reactions  . Penicillins Other (See Comments)    Dizzy    MEDICATION LIST PRIOR  TO VISIT: Current Outpatient Medications on File Prior to Visit  Medication Sig Dispense Refill  . amLODipine (NORVASC) 10 MG tablet Take 1 tablet (10 mg total) by mouth every morning. 90 tablet 1  . aspirin 81 MG EC tablet Take 81 mg by mouth daily. Swallow whole.    . fluticasone (FLONASE) 50 MCG/ACT nasal spray Place 2 sprays into both nostrils daily.    Marland Kitchen ipratropium (ATROVENT) 0.03 % nasal spray     . losartan-hydrochlorothiazide (HYZAAR) 50-12.5 MG tablet TAKE 1 TABLET BY MOUTH EVERY DAY 90 tablet 3  . Multiple Vitamin (MULTIVITAMIN WITH MINERALS) TABS tablet Take 1 tablet by mouth daily.    . rosuvastatin (CRESTOR) 20 MG tablet TAKE 1 TABLET BY MOUTH EVERY DAY 90 tablet 0  . latanoprost (XALATAN) 0.005 % ophthalmic solution Place 1 drop into both eyes daily.     No current facility-administered medications on file prior to visit.    PAST MEDICAL HISTORY: Past Medical History:  Diagnosis Date  . Carotid artery occlusion   . Coronary artery disease   . Hyperlipidemia   . Hypertension   . Myocardial infarction [facility]) 1997    PAST SURGICAL HISTORY: Past Surgical History:  Procedure Laterality Date  . ABDOMINAL HYSTERECTOMY    . CARDIAC CATHETERIZATION      FAMILY HISTORY: The patient's family history includes Heart disease in her father and mother; Hypertension in her father.   SOCIAL HISTORY:  The patient  reports that she has been smoking cigarettes. She has never used smokeless tobacco. She reports that she does  not drink alcohol and does not use drugs.  Review of Systems  Constitutional: Negative for chills and fever.  HENT: Negative for hoarse voice and nosebleeds.   Eyes: Negative for discharge, double vision and pain.  Cardiovascular: Negative for chest pain, claudication, dyspnea on exertion, leg swelling, near-syncope, orthopnea, palpitations, paroxysmal nocturnal dyspnea and syncope.  Respiratory: Negative for hemoptysis and shortness of breath.    Musculoskeletal: Negative for muscle cramps and myalgias.  Gastrointestinal: Negative for abdominal pain, constipation, diarrhea, hematemesis, hematochezia, melena, nausea and vomiting.  Neurological: Negative for dizziness and light-headedness.   PHYSICAL EXAM: Vitals with BMI 02/04/2020 12/04/2019 05/19/2018  Height _0  _1  -  Weight 160 lbs 167 lbs -  BMI 67.67 20.94 -  Systolic 709 628 366  Diastolic 67 79 75  Pulse 85 91 85    CONSTITUTIONAL: Well-developed and well-nourished. No acute distress.  SKIN: Skin is warm and dry. No rash noted. No cyanosis. No pallor. No jaundice HEAD: Normocephalic and atraumatic.  EYES: No scleral icterus MOUTH/THROAT: Moist oral membranes.  NECK: No JVD present. No thyromegaly noted. Left carotid bruits  LYMPHATIC: No visible cervical adenopathy.  CHEST Normal respiratory effort. No intercostal retractions  LUNGS: Clear to auscultation bilaterally. No stridor. No wheezes. No rales.  CARDIOVASCULAR: Regular rate and rhythm, positive S1-S2, no murmurs rubs or gallops appreciated. ABDOMINAL: No apparent ascites.  EXTREMITIES: No peripheral edema  HEMATOLOGIC: No significant bruising NEUROLOGIC: Oriented to person, place, and time. Nonfocal. Normal muscle tone.  PSYCHIATRIC: Normal mood and affect. Normal behavior. Cooperative  CARDIAC DATABASE: EKG: 12/04/2019: Normal sinus rhythm, 86 bpm, normal axis, without underlying injury pattern.  Echocardiogram: 11/13/2017 1. Left ventricle cavity is normal in size. Mild concentric hypertrophy of the left ventricle. Normal global wall motion. Visual EF is 60-65%. Doppler evidence of grade I (impaired) diastolic dysfunction. 2. Mild (Grade I) mitral regurgitation. Mild prolapse of posterior mitral valve leaflet. 3. Trace tricuspid regurgitation  Stress Testing:  No results found for this or any previous visit from the past 1095 days.  Heart Catheterization: None  Carotid artery duplex  11/09/2019:  Mild stenosis in the right internal carotid artery (1-15%). Stenosis in the right common carotid artery (<50%).  Stenosis in the left internal carotid artery (16-49%). Stenosis in the left common carotid artery (<50%).  Stenosis in the left external carotid artery (<50%).  Homogenous plaque bilateral carotid arteries.  Antegrade right vertebral artery flow. Antegrade left vertebral artery flow.  Follow up in one year is appropriate if clinically indicated. No significant change from 11/19/2018.  LABORATORY DATA: CBC Latest Ref Rng & Units 12/09/2017 05/13/2007 01/09/2007  WBC 3.4 - 10.8 x10E3/uL 5.8 6.1 -  Hemoglobin 11.1 - 15.9 g/dL 11.2 13.7 15.0  Hematocrit 34.0 - 46.6 % 33.7(L) 41.6 44.0  Platelets 150 - 450 x10E3/uL 319 382 -    CMP Latest Ref Rng & Units 12/07/2019 07/14/2018 12/09/2017  Glucose 65 - 99 mg/dL 109(H) 106(H) 94  BUN 8 - 27 mg/dL _2 Creatinine 0.57 - 1.00 mg/dL 1.27(H) 1.40(H) 1.38(H)  Sodium 134 - 144 mmol/L 141 141 140  Potassium 3.5 - 5.2 mmol/L 4.3 4.5 4.4  Chloride 96 - 106 mmol/L 100 104 103  CO2 20 - 29 mmol/L _3 Calcium 8.7 - 10.3 mg/dL 9.6 9.5 9.2  Total Protein 6.0 - 8.5 g/dL 7.8 7.4 7.1  Total Bilirubin 0.0 - 1.2 mg/dL <0.2 0.2 <0.2  Alkaline Phos 44 - 121 IU/L 41(L) 42 39  AST 0 - 40 IU/L _0 ALT 0 - 32 IU/L _1 Lipid Panel  Lab Results  Component Value Date   CHOL 139 12/07/2019   HDL 35 (L) 12/07/2019   LDLCALC 78 12/07/2019   LDLDIRECT 70 12/07/2019   TRIG 146 12/07/2019   CHOLHDL 3.6 07/14/2018   Lab Results  Component Value Date   HGBA1C 6.0 (H) 12/09/2017   No components found for: NTPROBNP Lab Results  Component Value Date   TSH 1.31 02/29/2016   TSH 0.895 05/13/2007    Cardiac Panel (last 3 results) No results for input(s): CKTOTAL, CKMB, TROPONINIHS, RELINDX in the last 72 hours.  IMPRESSION:    ICD-10-CM   1. Atherosclerosis of native coronary artery of native heart without angina  pectoris  I25.10 PCV MYOCARDIAL PERFUSION WITH LEXISCAN    Lipid Panel With LDL/HDL Ratio    LDL cholesterol, direct    CMP14+EGFR  2. Essential hypertension  I10   3. History of coronary angioplasty with insertion of stent  Z95.5 PCV MYOCARDIAL PERFUSION WITH LEXISCAN  4. Hyperlipidemia LDL goal <70  E78.5 Lipid Panel With LDL/HDL Ratio    LDL cholesterol, direct    CMP14+EGFR  5. Smoking  F17.200   6. Family history of premature CAD  Z82.49   7. Former tobacco use  Z87.891   8. Asymptomatic bilateral carotid artery stenosis  I65.23      RECOMMENDATIONS: LEWANNA PETRAK is a 74 y.o. female whose past medical history and cardiovascular risk factors include: Hypertension, hyperlipidemia, CAD with prior PCI to LCx in 1997, active smoker, family history of premature CAD, postmenopausal female, advanced age.  Established coronary artery disease with prior PCI to the LCx and without angina pectoris:  Denies any chest pain or anginal equivalent.  Her last echocardiogram was in 2019 which noted preserved LVEF and grade 1 diastolic impairment.  She had a PCI to the LCx back in 1997 and has not had any follow-up testing.  We will reschedule Lexiscan to evaluate for reversible ischemia prior to the next office visit.    Carotid artery atherosclerosis:  1 year follow-up study scheduled for October 2022.    She does not have any focal neurological deficits or vision changes.    Continue aspirin 81 mg p.o. daily.  Continue Crestor 20 mg p.o. nightly.  Educated on importance of complete smoking cessation.  Most recent lipid profile independently reviewed with her at today's office visit.  Benign essential hypertension:  Office blood pressure at goal.  Continue current hypertensive medication.  Low-salt diet recommended.  Hyperlipidemia:  Continue Crestor 20 mg p.o. nightly.  Most recent lipid profile independently reviewed with the patient at today's office visit.     Cigarette smoking:  Tobacco cessation counseling:  Currently smoking 2 cigarettes/day.   Patient was informed of the dangers of tobacco abuse including stroke, cancer, and MI, as well as benefits of tobacco cessation.  Patient is not willing to quit at this time.  Approximately 7 mins were spent counseling patient cessation techniques. We discussed various methods to help quit smoking, including deciding on a date to quit, joining a support group, pharmacological agents- nicotine gum/patch/lozenges, chantix.   I will reassess her progress at the next follow-up visit  FINAL MEDICATION LIST END OF ENCOUNTER: No orders of the defined types were placed in this encounter.   Medications Discontinued During This Encounter  Medication Reason  . VENTOLIN HFA 108 (90 Base) MCG/ACT inhaler  Completed Course     Current Outpatient Medications:  .  amLODipine (NORVASC) 10 MG tablet, Take 1 tablet (10 mg total) by mouth every morning., Disp: 90 tablet, Rfl: 1 .  aspirin 81 MG EC tablet, Take 81 mg by mouth daily. Swallow whole., Disp: , Rfl:  .  fluticasone (FLONASE) 50 MCG/ACT nasal spray, Place 2 sprays into both nostrils daily., Disp: , Rfl:  .  ipratropium (ATROVENT) 0.03 % nasal spray, , Disp: , Rfl:  .  losartan-hydrochlorothiazide (HYZAAR) 50-12.5 MG tablet, TAKE 1 TABLET BY MOUTH EVERY DAY, Disp: 90 tablet, Rfl: 3 .  Multiple Vitamin (MULTIVITAMIN WITH MINERALS) TABS tablet, Take 1 tablet by mouth daily., Disp: , Rfl:  .  rosuvastatin (CRESTOR) 20 MG tablet, TAKE 1 TABLET BY MOUTH EVERY DAY, Disp: 90 tablet, Rfl: 0 .  latanoprost (XALATAN) 0.005 % ophthalmic solution, Place 1 drop into both eyes daily., Disp: , Rfl:   Orders Placed This Encounter  Procedures  . Lipid Panel With LDL/HDL Ratio  . LDL cholesterol, direct  . CMP14+EGFR  . PCV MYOCARDIAL PERFUSION WITH LEXISCAN    --Continue cardiac medications as reconciled in final medication list. --Return in about 9 months  (around 11/14/2020) for Follow up coronary artery disease, CAD, review test results. Or sooner if needed. --Continue follow-up with your primary care physician regarding the management of your other chronic comorbid conditions.  Patient's questions and concerns were addressed to her satisfaction. She voices understanding of the instructions provided during this encounter.   This note was created using a voice recognition software as a result there may be grammatical errors inadvertently enclosed that do not reflect the nature of this encounter. Every attempt is made to correct such errors.  Rex Kras, [place], [facility]  Pager: [phone] Office: [phone]

## 2020-02-18 ENCOUNTER — Ambulatory Visit: Payer: Medicare Other

## 2020-04-01 ENCOUNTER — Ambulatory Visit: Payer: Medicare Other

## 2020-05-18 ENCOUNTER — Ambulatory Visit
Admission: RE | Admit: 2020-05-18 | Discharge: 2020-05-18 | Disposition: A | Discharge status: Home / Self Care | Payer: Medicare Other | Source: Ambulatory Visit | Attending: General Practice | Admitting: General Practice

## 2020-05-18 ENCOUNTER — Other Ambulatory Visit: Payer: Self-pay

## 2020-05-18 DIAGNOSIS — Z1231 Encounter for screening mammogram for malignant neoplasm of breast: Secondary | ICD-10-CM

## 2020-10-21 ENCOUNTER — Encounter (HOSPITAL_COMMUNITY): Payer: Self-pay

## 2020-10-21 ENCOUNTER — Emergency Department (HOSPITAL_COMMUNITY): Payer: Medicare Other

## 2020-10-21 ENCOUNTER — Other Ambulatory Visit: Payer: Self-pay

## 2020-10-21 ENCOUNTER — Encounter (HOSPITAL_COMMUNITY)
Admission: EM | Disposition: E | Payer: Self-pay | Source: Home / Self Care | Attending: Thoracic Surgery (Cardiothoracic Vascular Surgery)

## 2020-10-21 ENCOUNTER — Inpatient Hospital Stay (HOSPITAL_COMMUNITY)
Admission: EM | Admit: 2020-10-21 | Discharge: 2020-11-05 | DRG: 003 | Disposition: E | Payer: Medicare Other | Attending: Thoracic Surgery (Cardiothoracic Vascular Surgery) | Admitting: Thoracic Surgery (Cardiothoracic Vascular Surgery)

## 2020-10-21 DIAGNOSIS — I739 Peripheral vascular disease, unspecified: Secondary | ICD-10-CM | POA: Diagnosis not present

## 2020-10-21 DIAGNOSIS — I252 Old myocardial infarction: Secondary | ICD-10-CM

## 2020-10-21 DIAGNOSIS — I5023 Acute on chronic systolic (congestive) heart failure: Secondary | ICD-10-CM | POA: Diagnosis not present

## 2020-10-21 DIAGNOSIS — Z79899 Other long term (current) drug therapy: Secondary | ICD-10-CM

## 2020-10-21 DIAGNOSIS — I700 Atherosclerosis of aorta: Secondary | ICD-10-CM | POA: Diagnosis present

## 2020-10-21 DIAGNOSIS — I340 Nonrheumatic mitral (valve) insufficiency: Secondary | ICD-10-CM | POA: Diagnosis not present

## 2020-10-21 DIAGNOSIS — I214 Non-ST elevation (NSTEMI) myocardial infarction: Principal | ICD-10-CM | POA: Diagnosis present

## 2020-10-21 DIAGNOSIS — Z951 Presence of aortocoronary bypass graft: Secondary | ICD-10-CM

## 2020-10-21 DIAGNOSIS — J811 Chronic pulmonary edema: Secondary | ICD-10-CM

## 2020-10-21 DIAGNOSIS — Z955 Presence of coronary angioplasty implant and graft: Secondary | ICD-10-CM

## 2020-10-21 DIAGNOSIS — D689 Coagulation defect, unspecified: Secondary | ICD-10-CM | POA: Diagnosis not present

## 2020-10-21 DIAGNOSIS — Z4659 Encounter for fitting and adjustment of other gastrointestinal appliance and device: Secondary | ICD-10-CM

## 2020-10-21 DIAGNOSIS — Z515 Encounter for palliative care: Secondary | ICD-10-CM | POA: Diagnosis not present

## 2020-10-21 DIAGNOSIS — R7303 Prediabetes: Secondary | ICD-10-CM | POA: Diagnosis present

## 2020-10-21 DIAGNOSIS — E785 Hyperlipidemia, unspecified: Secondary | ICD-10-CM | POA: Diagnosis present

## 2020-10-21 DIAGNOSIS — R0989 Other specified symptoms and signs involving the circulatory and respiratory systems: Secondary | ICD-10-CM | POA: Diagnosis not present

## 2020-10-21 DIAGNOSIS — N170 Acute kidney failure with tubular necrosis: Secondary | ICD-10-CM | POA: Diagnosis not present

## 2020-10-21 DIAGNOSIS — J80 Acute respiratory distress syndrome: Secondary | ICD-10-CM | POA: Diagnosis not present

## 2020-10-21 DIAGNOSIS — I5020 Unspecified systolic (congestive) heart failure: Secondary | ICD-10-CM | POA: Diagnosis present

## 2020-10-21 DIAGNOSIS — I743 Embolism and thrombosis of arteries of the lower extremities: Secondary | ICD-10-CM | POA: Diagnosis not present

## 2020-10-21 DIAGNOSIS — Z7982 Long term (current) use of aspirin: Secondary | ICD-10-CM

## 2020-10-21 DIAGNOSIS — I25110 Atherosclerotic heart disease of native coronary artery with unstable angina pectoris: Secondary | ICD-10-CM | POA: Diagnosis present

## 2020-10-21 DIAGNOSIS — F17210 Nicotine dependence, cigarettes, uncomplicated: Secondary | ICD-10-CM | POA: Diagnosis present

## 2020-10-21 DIAGNOSIS — T85598A Other mechanical complication of other gastrointestinal prosthetic devices, implants and grafts, initial encounter: Secondary | ICD-10-CM

## 2020-10-21 DIAGNOSIS — I998 Other disorder of circulatory system: Secondary | ICD-10-CM | POA: Diagnosis not present

## 2020-10-21 DIAGNOSIS — I462 Cardiac arrest due to underlying cardiac condition: Secondary | ICD-10-CM | POA: Diagnosis not present

## 2020-10-21 DIAGNOSIS — Z9281 Personal history of extracorporeal membrane oxygenation (ECMO): Secondary | ICD-10-CM | POA: Diagnosis not present

## 2020-10-21 DIAGNOSIS — I5082 Biventricular heart failure: Secondary | ICD-10-CM | POA: Diagnosis present

## 2020-10-21 DIAGNOSIS — I5021 Acute systolic (congestive) heart failure: Secondary | ICD-10-CM | POA: Diagnosis not present

## 2020-10-21 DIAGNOSIS — R34 Anuria and oliguria: Secondary | ICD-10-CM | POA: Diagnosis present

## 2020-10-21 DIAGNOSIS — R739 Hyperglycemia, unspecified: Secondary | ICD-10-CM | POA: Diagnosis present

## 2020-10-21 DIAGNOSIS — I4891 Unspecified atrial fibrillation: Secondary | ICD-10-CM | POA: Diagnosis not present

## 2020-10-21 DIAGNOSIS — N1832 Chronic kidney disease, stage 3b: Secondary | ICD-10-CM | POA: Diagnosis present

## 2020-10-21 DIAGNOSIS — Z880 Allergy status to penicillin: Secondary | ICD-10-CM

## 2020-10-21 DIAGNOSIS — Z20822 Contact with and (suspected) exposure to covid-19: Secondary | ICD-10-CM | POA: Diagnosis present

## 2020-10-21 DIAGNOSIS — R079 Chest pain, unspecified: Secondary | ICD-10-CM | POA: Diagnosis not present

## 2020-10-21 DIAGNOSIS — Z95811 Presence of heart assist device: Secondary | ICD-10-CM | POA: Diagnosis not present

## 2020-10-21 DIAGNOSIS — D6959 Other secondary thrombocytopenia: Secondary | ICD-10-CM | POA: Diagnosis not present

## 2020-10-21 DIAGNOSIS — I255 Ischemic cardiomyopathy: Secondary | ICD-10-CM | POA: Diagnosis present

## 2020-10-21 DIAGNOSIS — R570 Cardiogenic shock: Secondary | ICD-10-CM | POA: Diagnosis not present

## 2020-10-21 DIAGNOSIS — Z01810 Encounter for preprocedural cardiovascular examination: Secondary | ICD-10-CM | POA: Diagnosis not present

## 2020-10-21 DIAGNOSIS — E875 Hyperkalemia: Secondary | ICD-10-CM | POA: Diagnosis not present

## 2020-10-21 DIAGNOSIS — I82411 Acute embolism and thrombosis of right femoral vein: Secondary | ICD-10-CM | POA: Diagnosis not present

## 2020-10-21 DIAGNOSIS — Z8249 Family history of ischemic heart disease and other diseases of the circulatory system: Secondary | ICD-10-CM

## 2020-10-21 DIAGNOSIS — J9601 Acute respiratory failure with hypoxia: Secondary | ICD-10-CM | POA: Diagnosis not present

## 2020-10-21 DIAGNOSIS — I6529 Occlusion and stenosis of unspecified carotid artery: Secondary | ICD-10-CM | POA: Diagnosis present

## 2020-10-21 DIAGNOSIS — D62 Acute posthemorrhagic anemia: Secondary | ICD-10-CM | POA: Diagnosis present

## 2020-10-21 DIAGNOSIS — J95821 Acute postprocedural respiratory failure: Secondary | ICD-10-CM | POA: Diagnosis not present

## 2020-10-21 DIAGNOSIS — J9819 Other pulmonary collapse: Secondary | ICD-10-CM | POA: Diagnosis not present

## 2020-10-21 DIAGNOSIS — R0489 Hemorrhage from other sites in respiratory passages: Secondary | ICD-10-CM | POA: Diagnosis present

## 2020-10-21 DIAGNOSIS — T82898A Other specified complication of vascular prosthetic devices, implants and grafts, initial encounter: Secondary | ICD-10-CM | POA: Diagnosis not present

## 2020-10-21 DIAGNOSIS — I469 Cardiac arrest, cause unspecified: Secondary | ICD-10-CM | POA: Diagnosis not present

## 2020-10-21 DIAGNOSIS — I130 Hypertensive heart and chronic kidney disease with heart failure and stage 1 through stage 4 chronic kidney disease, or unspecified chronic kidney disease: Secondary | ICD-10-CM | POA: Diagnosis present

## 2020-10-21 DIAGNOSIS — E876 Hypokalemia: Secondary | ICD-10-CM | POA: Diagnosis not present

## 2020-10-21 DIAGNOSIS — R042 Hemoptysis: Secondary | ICD-10-CM | POA: Diagnosis not present

## 2020-10-21 DIAGNOSIS — I4901 Ventricular fibrillation: Secondary | ICD-10-CM | POA: Diagnosis not present

## 2020-10-21 HISTORY — PX: LEFT HEART CATH AND CORONARY ANGIOGRAPHY: CATH118249

## 2020-10-21 LAB — GLUCOSE, CAPILLARY: Glucose-Capillary: 76 mg/dL (ref 70–99)

## 2020-10-21 LAB — DIFFERENTIAL
Abs Immature Granulocytes: 0 10*3/uL (ref 0.00–0.07)
Basophils Absolute: 0 10*3/uL (ref 0.0–0.1)
Basophils Relative: 1 %
Eosinophils Absolute: 0.1 10*3/uL (ref 0.0–0.5)
Eosinophils Relative: 1 %
Immature Granulocytes: 0 %
Lymphocytes Relative: 55 %
Lymphs Abs: 2.4 10*3/uL (ref 0.7–4.0)
Monocytes Absolute: 0.3 10*3/uL (ref 0.1–1.0)
Monocytes Relative: 8 %
Neutro Abs: 1.5 10*3/uL — ABNORMAL LOW (ref 1.7–7.7)
Neutrophils Relative %: 35 %

## 2020-10-21 LAB — COMPREHENSIVE METABOLIC PANEL
ALT: 14 U/L (ref 0–44)
AST: 24 U/L (ref 15–41)
Albumin: 3.9 g/dL (ref 3.5–5.0)
Alkaline Phosphatase: 25 U/L — ABNORMAL LOW (ref 38–126)
Anion gap: 11 (ref 5–15)
BUN: 16 mg/dL (ref 8–23)
CO2: 23 mmol/L (ref 22–32)
Calcium: 9.3 mg/dL (ref 8.9–10.3)
Chloride: 103 mmol/L (ref 98–111)
Creatinine, Ser: 1.38 mg/dL — ABNORMAL HIGH (ref 0.44–1.00)
GFR, Estimated: 40 mL/min — ABNORMAL LOW (ref >60)
Glucose, Bld: 100 mg/dL — ABNORMAL HIGH (ref 70–99)
Potassium: 3.6 mmol/L (ref 3.5–5.1)
Sodium: 137 mmol/L (ref 135–145)
Total Bilirubin: 0.5 mg/dL (ref 0.3–1.2)
Total Protein: 7.3 g/dL (ref 6.5–8.1)

## 2020-10-21 LAB — LIPID PANEL
Cholesterol: 123 mg/dL (ref 0–200)
HDL: 48 mg/dL (ref >40)
LDL Cholesterol: 65 mg/dL (ref 0–99)
Total CHOL/HDL Ratio: 2.6 RATIO
Triglycerides: 48 mg/dL (ref <150)
VLDL: 10 mg/dL (ref 0–40)

## 2020-10-21 LAB — TROPONIN I (HIGH SENSITIVITY)
Troponin I (High Sensitivity): 2047 ng/L (ref <18)
Troponin I (High Sensitivity): 2098 ng/L (ref <18)

## 2020-10-21 LAB — CBC
HCT: 39.5 % (ref 36.0–46.0)
Hemoglobin: 13 g/dL (ref 12.0–15.0)
MCH: 28.3 pg (ref 26.0–34.0)
MCHC: 32.9 g/dL (ref 30.0–36.0)
MCV: 85.9 fL (ref 80.0–100.0)
Platelets: 264 10*3/uL (ref 150–400)
RBC: 4.6 MIL/uL (ref 3.87–5.11)
RDW: 14.5 % (ref 11.5–15.5)
WBC: 4.4 10*3/uL (ref 4.0–10.5)
nRBC: 0 % (ref 0.0–0.2)

## 2020-10-21 LAB — RESP PANEL BY RT-PCR (FLU A&B, COVID) ARPGX2
Influenza A by PCR: NEGATIVE
Influenza B by PCR: NEGATIVE
SARS Coronavirus 2 by RT PCR: NEGATIVE

## 2020-10-21 LAB — HEMOGLOBIN A1C
Hgb A1c MFr Bld: 6.1 % — ABNORMAL HIGH (ref 4.8–5.6)
Mean Plasma Glucose: 128.37 mg/dL

## 2020-10-21 SURGERY — LEFT HEART CATH AND CORONARY ANGIOGRAPHY
Anesthesia: LOCAL

## 2020-10-21 MED ORDER — ONDANSETRON HCL 4 MG/2ML IJ SOLN: 4.0000 mg | Freq: Four times a day (QID) | INTRAMUSCULAR | Status: DC | PRN

## 2020-10-21 MED ORDER — ACETAMINOPHEN 325 MG PO TABS: 650.0000 mg | ORAL_TABLET | ORAL | Status: DC | PRN

## 2020-10-21 MED ORDER — FENTANYL CITRATE (PF) 100 MCG/2ML IJ SOLN
INTRAMUSCULAR | Status: DC | PRN
  Administered 2020-10-21: 50 ug via INTRAVENOUS

## 2020-10-21 MED ORDER — NITROGLYCERIN IN D5W 200-5 MCG/ML-% IV SOLN
2.0000 ug/min | INTRAVENOUS | Status: DC
Start: 2020-10-21 — End: 2020-10-25
  Administered 2020-10-21: 10 ug/min via INTRAVENOUS
  Administered 2020-10-23: 25 ug/min via INTRAVENOUS
  Administered 2020-10-25: 35 ug/min via INTRAVENOUS
  Filled 2020-10-21 (×2): qty 250

## 2020-10-21 MED ORDER — HEPARIN (PORCINE) IN NACL 1000-0.9 UT/500ML-% IV SOLN
INTRAVENOUS | Status: DC | PRN
  Administered 2020-10-21 (×2): 500 mL

## 2020-10-21 MED ORDER — ASPIRIN 81 MG PO CHEW
162.0000 mg | CHEWABLE_TABLET | Freq: Once | ORAL | Status: AC
  Administered 2020-10-21: 162 mg via ORAL
  Filled 2020-10-21: qty 2

## 2020-10-21 MED ORDER — VERAPAMIL HCL 2.5 MG/ML IV SOLN: INTRAVENOUS | Status: DC | PRN

## 2020-10-21 MED ORDER — NITROGLYCERIN IN D5W 200-5 MCG/ML-% IV SOLN
INTRAVENOUS | Status: AC
  Filled 2020-10-21: qty 250

## 2020-10-21 MED ORDER — MIDAZOLAM HCL 2 MG/2ML IJ SOLN
INTRAMUSCULAR | Status: DC | PRN
  Administered 2020-10-21: 1 mg via INTRAVENOUS

## 2020-10-21 MED ORDER — AMLODIPINE BESYLATE 5 MG PO TABS
5.0000 mg | ORAL_TABLET | Freq: Every morning | ORAL | Status: DC
  Administered 2020-10-22 – 2020-10-24 (×3): 5 mg via ORAL
  Filled 2020-10-21 (×3): qty 1

## 2020-10-21 MED ORDER — MIDAZOLAM HCL 2 MG/2ML IJ SOLN
INTRAMUSCULAR | Status: AC
  Filled 2020-10-21: qty 2

## 2020-10-21 MED ORDER — LIDOCAINE HCL (PF) 1 % IJ SOLN
INTRAMUSCULAR | Status: DC | PRN
  Administered 2020-10-21: 2 mL

## 2020-10-21 MED ORDER — HEPARIN SODIUM (PORCINE) 1000 UNIT/ML IJ SOLN
INTRAMUSCULAR | Status: DC | PRN
  Administered 2020-10-21: 3500 [IU] via INTRAVENOUS

## 2020-10-21 MED ORDER — SODIUM CHLORIDE 0.9% FLUSH: 3.0000 mL | INTRAVENOUS | Status: DC | PRN

## 2020-10-21 MED ORDER — HEPARIN BOLUS VIA INFUSION
4000.0000 [IU] | Freq: Once | INTRAVENOUS | Status: DC
  Filled 2020-10-21: qty 4000

## 2020-10-21 MED ORDER — SODIUM CHLORIDE 0.9 % IV SOLN: INTRAVENOUS | Status: AC

## 2020-10-21 MED ORDER — VERAPAMIL HCL 2.5 MG/ML IV SOLN
INTRAVENOUS | Status: AC
  Filled 2020-10-21: qty 2

## 2020-10-21 MED ORDER — LATANOPROST 0.005 % OP SOLN
1.0000 [drp] | Freq: Every day | OPHTHALMIC | Status: DC
  Administered 2020-10-21 – 2020-10-24 (×4): 1 [drp] via OPHTHALMIC
  Filled 2020-10-21 (×2): qty 2.5

## 2020-10-21 MED ORDER — IOHEXOL 350 MG/ML SOLN
INTRAVENOUS | Status: DC | PRN
  Administered 2020-10-21: 30 mL

## 2020-10-21 MED ORDER — FENTANYL CITRATE (PF) 100 MCG/2ML IJ SOLN
INTRAMUSCULAR | Status: AC
  Filled 2020-10-21: qty 2

## 2020-10-21 MED ORDER — SODIUM CHLORIDE 0.9 % IV SOLN: 250.0000 mL | INTRAVENOUS | Status: DC | PRN

## 2020-10-21 MED ORDER — HYDRALAZINE HCL 20 MG/ML IJ SOLN: 10.0000 mg | INTRAMUSCULAR | Status: AC | PRN

## 2020-10-21 MED ORDER — SODIUM CHLORIDE 0.9% FLUSH
3.0000 mL | Freq: Two times a day (BID) | INTRAVENOUS | Status: DC
  Administered 2020-10-21 – 2020-10-24 (×5): 3 mL via INTRAVENOUS

## 2020-10-21 MED ORDER — ROSUVASTATIN CALCIUM 20 MG PO TABS
40.0000 mg | ORAL_TABLET | Freq: Every day | ORAL | Status: DC
  Administered 2020-10-21 – 2020-10-31 (×9): 40 mg via ORAL
  Filled 2020-10-21 (×9): qty 2

## 2020-10-21 MED ORDER — ASPIRIN EC 81 MG PO TBEC
162.0000 mg | DELAYED_RELEASE_TABLET | Freq: Every morning | ORAL | Status: DC
  Administered 2020-10-22 – 2020-10-24 (×3): 162 mg via ORAL
  Filled 2020-10-21: qty 2

## 2020-10-21 MED ORDER — LIDOCAINE HCL (PF) 1 % IJ SOLN
INTRAMUSCULAR | Status: AC
  Filled 2020-10-21: qty 30

## 2020-10-21 MED ORDER — LABETALOL HCL 5 MG/ML IV SOLN: 10.0000 mg | INTRAVENOUS | Status: AC | PRN

## 2020-10-21 MED ORDER — HEPARIN (PORCINE) 25000 UT/250ML-% IV SOLN: 950.0000 [IU]/h | INTRAVENOUS | Status: DC

## 2020-10-21 MED ORDER — HEPARIN (PORCINE) IN NACL 1000-0.9 UT/500ML-% IV SOLN
INTRAVENOUS | Status: AC
  Filled 2020-10-21: qty 1000

## 2020-10-21 MED ORDER — HEPARIN SODIUM (PORCINE) 1000 UNIT/ML IJ SOLN
INTRAMUSCULAR | Status: AC
  Filled 2020-10-21: qty 1

## 2020-10-21 SURGICAL SUPPLY — 10 items
CATH 5FR JL3.5 JR4 ANG PIG MP (CATHETERS) ×2 IMPLANT
CATH INFINITI 5 FR 3DRC (CATHETERS) ×2 IMPLANT
DEVICE RAD COMP TR BAND LRG (VASCULAR PRODUCTS) ×2 IMPLANT
GLIDESHEATH SLEND A-KIT 6F 22G (SHEATH) ×2 IMPLANT
GUIDEWIRE INQWIRE 1.5J.035X260 (WIRE) ×1 IMPLANT
INQWIRE 1.5J .035X260CM (WIRE) ×2
KIT HEART LEFT (KITS) ×2 IMPLANT
PACK CARDIAC CATHETERIZATION (CUSTOM PROCEDURE TRAY) ×2 IMPLANT
TRANSDUCER W/STOPCOCK (MISCELLANEOUS) ×2 IMPLANT
TUBING CIL FLEX 10 FLL-RA (TUBING) ×2 IMPLANT

## 2020-10-21 NOTE — Progress Notes (Signed)
ANTICOAGULATION CONSULT NOTE - Initial Consult  Pharmacy Consult for heparin Indication: chest pain/ACS  Allergies  Allergen Reactions   Penicillins Other (See Comments)    Dizzy     Patient Measurements:   Heparin Dosing Weight: 72 kg   Vital Signs: Temp: 97.5 F (36.4 C) (09/16 1334) BP: 126/80 (09/16 1510) Pulse Rate: 73 (09/16 1510)  Labs: Recent Labs    10/26/2020 1333  HGB 13.0  HCT 39.5  PLT 264  CREATININE 1.38*  TROPONINIHS 2,047*    CrCl cannot be calculated (Unknown ideal weight.).   Medical History: Past Medical History:  Diagnosis Date   Carotid artery occlusion    Coronary artery disease    Hyperlipidemia    Hypertension    Myocardial infarction [facility]) 1997    Medications:  (Not in a hospital admission)   Assessment: 12 YOF who presented with chest pain, N/V and SOB. Initial troponin is elevated. Pharmacy consulted to start IV heparin for ACS.   H/H and Plt wnl, SCr mildly elevated   Goal of Therapy:  Heparin level 0.3-0.7 units/ml Monitor platelets by anticoagulation protocol: Yes   Plan:  -Give heparin 4000 units IV bolus followed by heparin infusion at 950 units/hr -F/u 8 hr HL -Monitor daily HL, CBC and s/s of bleeding   Vinnie Level, PharmD., BCPS, BCCCP Clinical Pharmacist Please refer to [facility] for unit-specific pharmacist

## 2020-10-21 NOTE — H&P (Addendum)
Veronica Swanson is an 75 y.o. female.   Chief Complaint: Chest pain HPI:   75 y.o. African American female  with hypertension, hyperlipidemia, prediabetes, CAD (prior Lcx PCI in 1997), long time smoker-recently quit, presented with chest pain  Patient has been having retrosternal chest pain radiating to her arms and neck, worse with exertion, for the last two months. This pain was not constant. However, since last night, patient has started having pain where she feels like she had "someone walking on her chest", along with numbness in both her hands. Since then, pain has not relented. At this point, while the chest pain has subsided, she has still having pain in her back and her neck.   Serial EKG's show dynamic changes with mild ST elevation in lead v1 and aVR, along with diffuse horizontal ST depressions in leads I, II, aVL, V4-V6, and downsloping ST depressions in leads V2, V3. HS trop is elevated at 2000. Chest Xray showed aorta calcification, but no widening of mediastinum.    Past Medical History:  Diagnosis Date   Carotid artery occlusion    Coronary artery disease    Hyperlipidemia    Hypertension    Myocardial infarction [facility]) 1997    Past Surgical History:  Procedure Laterality Date   ABDOMINAL HYSTERECTOMY     CARDIAC CATHETERIZATION       Family History  Problem Relation Age of Onset   Heart disease Mother    Hypertension Father    Heart disease Father    Thyroid disease Neg Hx     Social History:  reports that she has been smoking cigarettes. She has never used smokeless tobacco. She reports that she does not drink alcohol and does not use drugs.  Allergies:  Allergies  Allergen Reactions   Penicillins Other (See Comments)    Dizzy     Review of Systems  Constitutional: Negative for decreased appetite, malaise/fatigue, weight gain and weight loss.  HENT:  Negative for congestion.   Eyes:  Negative for visual disturbance.  Cardiovascular:  Positive for  chest pain. Negative for dyspnea on exertion, leg swelling, palpitations and syncope.  Respiratory:  Negative for cough.   Endocrine: Negative for cold intolerance.  Hematologic/Lymphatic: Does not bruise/bleed easily.  Skin:  Negative for itching and rash.  Musculoskeletal:  Negative for myalgias.  Gastrointestinal:  Negative for abdominal pain, nausea and vomiting.  Genitourinary:  Negative for dysuria.  Neurological:  Positive for numbness (In both hands). Negative for dizziness and weakness.  Psychiatric/Behavioral:  The patient is not nervous/anxious.   All other systems reviewed and are negative.   Blood pressure 126/80, pulse 73, temperature (!) 97.5 F (36.4 C), resp. rate 17, SpO2 100 %. There is no height or weight on file to calculate BMI.  Physical Exam Vitals and nursing note reviewed.  Constitutional:      General: She is not in acute distress.    Appearance: She is well-developed.  HENT:     Head: Normocephalic and atraumatic.  Eyes:     Conjunctiva/sclera: Conjunctivae normal.     Pupils: Pupils are equal, round, and reactive to light.  Neck:     Vascular: No JVD.  Cardiovascular:     Rate and Rhythm: Normal rate and regular rhythm.     Pulses: Intact distal pulses. Decreased pulses (Difficult to palpate distal pulses in the setting of leg edema).     Heart sounds: No murmur heard. Pulmonary:     Effort: Pulmonary effort is normal.  Breath sounds: Normal breath sounds. No wheezing or rales.  Abdominal:     General: Bowel sounds are normal.     Palpations: Abdomen is soft.     Tenderness: There is no rebound.  Musculoskeletal:        General: No tenderness. Normal range of motion.     Right lower leg: Edema (1+) present.     Left lower leg: Edema (1+) present.  Lymphadenopathy:     Cervical: No cervical adenopathy.  Skin:    General: Skin is warm and dry.  Neurological:     Mental Status: She is alert and oriented to person, place, and time.      Cranial Nerves: No cranial nerve deficit.    Results for orders placed or performed during the hospital encounter of 10/29/2020 (from the past 48 hour(s))  CBC     Status: None   Collection Time: 10/06/2020  1:33 PM  Result Value Ref Range   WBC 4.4 4.0 - 10.5 K/uL   RBC 4.60 3.87 - 5.11 MIL/uL   Hemoglobin 13.0 12.0 - 15.0 g/dL   HCT 18.2 99.3 - 71.6 %   MCV 85.9 80.0 - 100.0 fL   MCH 28.3 26.0 - 34.0 pg   MCHC 32.9 30.0 - 36.0 g/dL   RDW 96.7 89.3 - 81.0 %   Platelets 264 150 - 400 K/uL   nRBC 0.0 0.0 - 0.2 %    Comment: Performed at [facility], [address] [place], [place] [address]  Troponin I (High Sensitivity)     Status: Abnormal   Collection Time: 10/20/2020  1:33 PM  Result Value Ref Range   Troponin I (High Sensitivity) 2,047 (HH) <18 ng/L    Comment: CRITICAL RESULT CALLED TO, READ BACK BY AND VERIFIED WITH: Sula Rumple, RN [phone] 10/19/2020 L. KLAR (NOTE) Elevated high sensitivity troponin I (hsTnI) values and significant  changes across serial measurements may suggest ACS but many other  chronic and acute conditions are known to elevate hsTnI results.  Refer to the Links section for chest pain algorithms and additional  guidance. Performed at [facility], [address] [place], [place] [address]   Comprehensive metabolic panel     Status: Abnormal   Collection Time: 11/04/2020  1:33 PM  Result Value Ref Range   Sodium 137 135 - 145 mmol/L   Potassium 3.6 3.5 - 5.1 mmol/L   Chloride 103 98 - 111 mmol/L   CO2 23 22 - 32 mmol/L   Glucose, Bld 100 (H) 70 - 99 mg/dL    Comment: Glucose reference range applies only to samples taken after fasting for at least 8 hours.   BUN 16 8 - 23 mg/dL   Creatinine, Ser 4.23 (H) 0.44 - 1.00 mg/dL   Calcium 9.3 8.9 - 53.6 mg/dL   Total Protein 7.3 6.5 - 8.1 g/dL   Albumin 3.9 3.5 - 5.0 g/dL   AST 24 15 - 41 U/L   ALT 14 0 - 44 U/L   Alkaline Phosphatase 25 (L) 38 - 126 U/L   Total Bilirubin 0.5 0.3 - 1.2 mg/dL    GFR, Estimated 40 (L) >60 mL/min    Comment: (NOTE) Calculated using the CKD-EPI Creatinine Equation (2021)    Anion gap 11 5 - 15    Comment: Performed at [facility], [address] [place], [place] [address]  Differential     Status: Abnormal   Collection Time: 11/03/2020  1:33  PM  Result Value Ref Range   Neutrophils Relative % 35 %   Neutro Abs 1.5 (L) 1.7 - 7.7 K/uL   Lymphocytes Relative 55 %   Lymphs Abs 2.4 0.7 - 4.0 K/uL   Monocytes Relative 8 %   Monocytes Absolute 0.3 0.1 - 1.0 K/uL   Eosinophils Relative 1 %   Eosinophils Absolute 0.1 0.0 - 0.5 K/uL   Basophils Relative 1 %   Basophils Absolute 0.0 0.0 - 0.1 K/uL   Immature Granulocytes 0 %   Abs Immature Granulocytes 0.00 0.00 - 0.07 K/uL    Comment: Performed at [facility], [address], [place], [place] [address]    Labs:   Lab Results  Component Value Date   WBC 4.4 10/17/2020   HGB 13.0 11/02/2020   HCT 39.5 10/26/2020   MCV 85.9 10/20/2020   PLT 264 10/17/2020    Recent Labs  Lab 10/21/2020 1333  NA 137  K 3.6  CL 103  CO2 23  BUN 16  CREATININE 1.38*  CALCIUM 9.3  PROT 7.3  BILITOT 0.5  ALKPHOS 25*  ALT 14  AST 24  GLUCOSE 100*    Lipid Panel     Component Value Date/Time   CHOL 139 12/07/2019 1316   TRIG 146 12/07/2019 1316   HDL 35 (L) 12/07/2019 1316   CHOLHDL 3.6 07/14/2018 1127   CHOLHDL 7.8 Ratio 09/22/2007 2153   VLDL 38 09/22/2007 2153   LDLCALC 78 12/07/2019 1316    BNP (last 3 results) No results for input(s): BNP in the last 8760 hours.  HEMOGLOBIN A1C Lab Results  Component Value Date   HGBA1C 6.0 (H) 12/09/2017    Cardiac Panel (last 3 results) No results for input(s): CKTOTAL, CKMB, RELINDX in the last 8760 hours.  Invalid input(s): TROPONINHS  Lab Results  Component Value Date   CKTOTAL 50 05/13/2007   CKMB 1.7 05/13/2007     TSH No results for input(s): TSH in the last 8760 hours.   (Not in a hospital admission)    No current  facility-administered medications for this encounter.  Current Outpatient Medications:    amLODipine (NORVASC) 10 MG tablet, Take 1 tablet (10 mg total) by mouth every morning., Disp: 90 tablet, Rfl: 1   aspirin 81 MG EC tablet, Take 81 mg by mouth daily. Swallow whole., Disp: , Rfl:    fluticasone (FLONASE) 50 MCG/ACT nasal spray, Place 2 sprays into both nostrils daily., Disp: , Rfl:    ipratropium (ATROVENT) 0.03 % nasal spray, , Disp: , Rfl:    latanoprost (XALATAN) 0.005 % ophthalmic solution, Place 1 drop into both eyes daily., Disp: , Rfl:    losartan-hydrochlorothiazide (HYZAAR) 50-12.5 MG tablet, TAKE 1 TABLET BY MOUTH EVERY DAY, Disp: 90 tablet, Rfl: 3   Multiple Vitamin (MULTIVITAMIN WITH MINERALS) TABS tablet, Take 1 tablet by mouth daily., Disp: , Rfl:    rosuvastatin (CRESTOR) 20 MG tablet, TAKE 1 TABLET BY MOUTH EVERY DAY, Disp: 90 tablet, Rfl: 0   Today's Vitals   11/04/2020 1334 10/10/2020 1510  BP: (!) 150/86 126/80  Pulse: 72 73  Resp: 16 17  Temp: (!) 97.5 F (36.4 C)   SpO2: 100% 100%   There is no height or weight on file to calculate BMI.     CARDIAC STUDIES:  EKG 10/16/2020: Sinus rhythm.   Anterior infarct, age indeterminate Mild ST elevation in lead V1, aVR does not meet STEMI criteria. Diffuse ischemic changes suspicious for left  main or multivessel ischemia  Echocardiogram 2019: 1. Left ventricle cavity is normal in size. Mild concentric hypertrophy of the left ventricle. Normal global wall motion. Visual EF is 60-65%. Doppler evidence of grade I (impaired) diastolic dysfunction. 2. Mild (Grade I) mitral regurgitation. Mild prolapse of posterior mitral valve leaflet. 3. Trace tricuspid regurgitation    Assessment/Plan  74 y.o. African American female  with hypertension, hyperlipidemia, prediabetes, CAD (prior Lcx PCI in 1997), long time smoker-recently quit, presented with chest pain  Chest pain: Progressive worsening of anginal symptoms for last 2  months, with acute worsening and constant pain since last night.  EKG shows diffuse ischemic changes suspicious for left main or multivessel CAD.  High sensitive troponin elevated at 2000.  Chest pain is improved, but has persistent back and neck pain.  Low suspicion for dissection of aorta given equal pulses, no severe AI murmur, no widening of mediastinum.  Recommend urgent coronary angiography with consideration for intervention should she have non-left main disease.  Further recommendations after coronary angiography.   Elder Negus, MD Pager: [phone] Office: [phone]

## 2020-10-21 NOTE — ED Provider Notes (Signed)
[facility] EMERGENCY DEPARTMENT Provider Note   CSN: [id number] Arrival date & time: 10/25/2020  1320     History Chief Complaint  Patient presents with   Chest Pain    Veronica Swanson is a 75 y.o. female.  75 year old female with prior medical history as detailed below presents for evaluation.  Patient complains of persistent chest discomfort for the last 2 months.  Patient reports that this pain seems to be worse with eating.  Patient reports increased discomfort over the last 48 to 72 hours.  At the time of my evaluation, patient reports improvement in her symptoms.  She complains of mild substernal chest pressure.  She denies associated nausea, vomiting, shortness of breath.  The history is provided by the patient.  Chest Pain Pain location:  Substernal area Pain quality: aching   Pain radiates to:  Does not radiate Pain severity:  Mild Onset quality:  Gradual Duration:  2 months Timing:  Constant Progression:  Waxing and waning Chronicity:  New     Past Medical History:  Diagnosis Date   Carotid artery occlusion    Coronary artery disease    Hyperlipidemia    Hypertension    Myocardial infarction [facility]) 1997    Patient Active Problem List   Diagnosis Date Noted   Hyperlipidemia LDL goal <70 05/19/2018   Atherosclerosis of native coronary artery of native heart without angina pectoris 05/19/2018   Essential hypertension 12/09/2017   Type 2 diabetes mellitus without complication, without long-term current use of insulin (HCC) 12/09/2017   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     Family History  Problem Relation Age of Onset   Heart disease Mother    Hypertension Father    Heart disease Father    Thyroid disease Neg Hx     Social History   Tobacco Use    Smoking status: Some Days    Types: Cigarettes   Smokeless tobacco: Never   Tobacco comments:    2 per day   Substance Use Topics   Alcohol use: Never   Drug use: Never    Home Medications Prior to Admission medications   Medication Sig Start Date End Date Taking? Authorizing Provider  amLODipine (NORVASC) 10 MG tablet Take 1 tablet (10 mg total) by mouth every morning. 10/20/18   Yates Decamp, MD  aspirin 81 MG EC tablet Take 81 mg by mouth daily. Swallow whole.    [provider]  fluticasone (FLONASE) 50 MCG/ACT nasal spray Place 2 sprays into both nostrils daily. 02/13/18   [provider]  ipratropium (ATROVENT) 0.03 % nasal spray  10/21/17   [provider]  latanoprost (XALATAN) 0.005 % ophthalmic solution Place 1 drop into both eyes daily. 01/15/20   [provider]  losartan-hydrochlorothiazide (HYZAAR) 50-12.5 MG tablet TAKE 1 TABLET BY MOUTH EVERY DAY 04/06/19   Toniann Fail, NP  Multiple Vitamin (MULTIVITAMIN WITH MINERALS) TABS tablet Take 1 tablet by mouth daily.    [provider]  rosuvastatin (CRESTOR) 20 MG tablet TAKE 1 TABLET BY MOUTH EVERY DAY 04/02/19   Yates Decamp, MD    Allergies    Penicillins  Review of Systems   Review of Systems  Cardiovascular:  Positive for chest pain.  All other systems  reviewed and are negative.  Physical Exam Updated Vital Signs BP 126/80   Pulse 73   Temp (!) 97.5 F (36.4 C)   Resp 17   SpO2 100%   Physical Exam Vitals and nursing note reviewed.  Constitutional:      General: She is not in acute distress.    Appearance: Normal appearance. She is well-developed.  HENT:     Head: Normocephalic and atraumatic.  Eyes:     Conjunctiva/sclera: Conjunctivae normal.     Pupils: Pupils are equal, round, and reactive to light.  Cardiovascular:     Rate and Rhythm: Normal rate and regular rhythm.     Heart sounds: Normal heart sounds.  Pulmonary:     Effort: Pulmonary effort is  normal. No respiratory distress.     Breath sounds: Normal breath sounds.  Abdominal:     General: There is no distension.     Palpations: Abdomen is soft.     Tenderness: There is no abdominal tenderness.  Musculoskeletal:        General: No deformity. Normal range of motion.     Cervical back: Normal range of motion and neck supple.  Skin:    General: Skin is warm and dry.  Neurological:     General: No focal deficit present.     Mental Status: She is alert and oriented to person, place, and time.    ED Results / Procedures / Treatments   Labs (all labs ordered are listed, but only abnormal results are displayed) Labs Reviewed  COMPREHENSIVE METABOLIC PANEL - Abnormal; Notable for the following components:      Result Value   Glucose, Bld 100 (*)    Creatinine, Ser 1.38 (*)    Alkaline Phosphatase 25 (*)    GFR, Estimated 40 (*)    All other components within normal limits  DIFFERENTIAL - Abnormal; Notable for the following components:   Neutro Abs 1.5 (*)    All other components within normal limits  TROPONIN I (HIGH SENSITIVITY) - Abnormal; Notable for the following components:   Troponin I (High Sensitivity) 2,047 (*)    All other components within normal limits  CBC  TROPONIN I (HIGH SENSITIVITY)    EKG None  Radiology DG Chest 2 View  Result Date: 2020/11/16 CLINICAL DATA:  Chest pain and shortness of breath. EXAM: CHEST - 2 VIEW COMPARISON:  None. FINDINGS: The heart size and mediastinal contours are within normal limits. Atherosclerotic calcification of the aortic arch. Normal pulmonary vascularity. No focal consolidation, pleural effusion, or pneumothorax. Mild linear scarring in the posterior right lower lobe. No acute osseous abnormality. IMPRESSION: 1. No acute cardiopulmonary disease. Electronically Signed   By: Obie Dredge M.D.   On: 2020/11/16 14:13    Procedures Procedures   Medications Ordered in ED Medications  aspirin chewable tablet 162 mg  (162 mg Oral Given 2020/11/16 1520)    ED Course  I have reviewed the triage vital signs and the nursing notes.  Pertinent labs & imaging results that were available during my care of the patient were reviewed by me and considered in my medical decision making (see chart for details).    MDM Rules/Calculators/A&P                           MDM  MSE complete   MIESHIA PEPITONE was evaluated in Emergency Department on 11/16/2020 for the symptoms described in the history of present illness. She  was evaluated in the context of the global COVID-19 pandemic, which necessitated consideration that the patient might be at risk for infection with the SARS-CoV-2 virus that causes COVID-19. Institutional protocols and algorithms that pertain to the evaluation of patients at risk for COVID-19 are in a state of rapid change based on information released by regulatory bodies including the CDC and federal and state organizations. These policies and algorithms were followed during the patient's care in the ED.  Patient presents with complaint of chest pain.  Patient's chest pain has been ongoing for 2 months per report.  EKG is concerning for ischemia with elevation in aVR elevation and reciprocal changes.  Initial Troponin is noted at 2047.  Dr. Rosemary Holms made aware of case and he will evaluate the patient.  Tentative plan for catheterization this afternoon.    Final Clinical Impression(s) / ED Diagnoses Final diagnoses:  Chest pain, unspecified type     Rx / DC Orders ED Discharge Orders     None        Wynetta Fines, MD 10/26/2020 1627

## 2020-10-21 NOTE — ED Provider Notes (Signed)
Emergency Medicine Provider Triage Evaluation Note  Veronica Swanson , a 75 y.o. female  was evaluated in triage.  Pt complains of chest pain x2 months.  Worsened today, radiates to both arms and to the back.  No associated nausea, vomiting, shortness of breath.  History of a heart attack, states this 1 feels different..  Review of Systems  Positive: Chest pain Negative: Shortness of breath  Physical Exam  There were no vitals taken for this visit. Gen:   Awake, no distress   Resp:  Normal effort  MSK:   Moves extremities without difficulty  Other:    Medical Decision Making  Medically screening exam initiated at 1:33 PM.  Appropriate orders placed.  Veronica Swanson was informed that the remainder of the evaluation will be completed by another provider, this initial triage assessment does not replace that evaluation, and the importance of remaining in the ED until their evaluation is complete.  Will initiate chest pain work-up   Theron Arista, PA-C 10/22/2020 1336    Wynetta Fines, MD 10/22/20 1747

## 2020-10-21 NOTE — ED Triage Notes (Signed)
Pt reports chest pain for the past 2 months, worse today. Pain radiates to her back.

## 2020-10-21 NOTE — Progress Notes (Signed)
Pt with c/o generalized pain to bilateral arms, back and chest, described as sore and tingling, "hurts", Dr Rosemary Holms at bedside, RN given order to start IV NTG, titrate to control pain, see MAR, safety maintained, VSS, no other orders given at this time, Dr Rosemary Holms spoke with pts family by phone

## 2020-10-21 NOTE — Interval H&P Note (Signed)
History and Physical Interval Note:  10/17/2020 4:17 PM  Veronica Swanson  has presented today for surgery, with the diagnosis of chest pain.  The various methods of treatment have been discussed with the patient and family. After consideration of risks, benefits and other options for treatment, the patient has consented to  Procedure(s): LEFT HEART CATH AND CORONARY ANGIOGRAPHY (N/A) as a surgical intervention.  The patient's history has been reviewed, patient examined, no change in status, stable for surgery.  I have reviewed the patient's chart and labs.  Questions were answered to the patient's satisfaction.    2016 Appropriate Use Criteria for Coronary Revascularization in Patients With Acute Coronary Syndrome NSTEMI/UA High Risk (TIMI Score 5-7) NSTEMI/Unstable angina, stabilized patient at high risk Link Here: https://powell.info/ Indication:  Revascularization by PCI or CABG of 1 or more arteries in a patient with NSTEMI or unstable angina with Stabilization after presentation High risk for clinical events A (7) Indication: 16; Score 7   Kimberlee Shoun J Alfa Leibensperger

## 2020-10-22 ENCOUNTER — Inpatient Hospital Stay (HOSPITAL_COMMUNITY): Payer: Medicare Other

## 2020-10-22 ENCOUNTER — Encounter (HOSPITAL_COMMUNITY): Payer: Self-pay | Admitting: Cardiology

## 2020-10-22 ENCOUNTER — Encounter (HOSPITAL_COMMUNITY): Payer: Medicare Other

## 2020-10-22 DIAGNOSIS — I25110 Atherosclerotic heart disease of native coronary artery with unstable angina pectoris: Secondary | ICD-10-CM | POA: Diagnosis not present

## 2020-10-22 DIAGNOSIS — I214 Non-ST elevation (NSTEMI) myocardial infarction: Secondary | ICD-10-CM | POA: Diagnosis not present

## 2020-10-22 DIAGNOSIS — I5020 Unspecified systolic (congestive) heart failure: Secondary | ICD-10-CM | POA: Diagnosis present

## 2020-10-22 LAB — ECHOCARDIOGRAM COMPLETE
AR max vel: 1.65 cm2
AV Area VTI: 1.92 cm2
AV Area mean vel: 1.8 cm2
AV Mean grad: 3 mmHg
AV Peak grad: 6.2 mmHg
Ao pk vel: 1.24 m/s
Area-P 1/2: 4.01 cm2
Height: 67 in
MV M vel: 3.94 m/s
MV Peak grad: 62.1 mmHg
MV VTI: 1.64 cm2
Radius: 0.5 cm
S' Lateral: 2.8 cm
Weight: 2400 oz

## 2020-10-22 LAB — CBC
HCT: 37.4 % (ref 36.0–46.0)
Hemoglobin: 12.2 g/dL (ref 12.0–15.0)
MCH: 27.9 pg (ref 26.0–34.0)
MCHC: 32.6 g/dL (ref 30.0–36.0)
MCV: 85.4 fL (ref 80.0–100.0)
Platelets: 246 10*3/uL (ref 150–400)
RBC: 4.38 MIL/uL (ref 3.87–5.11)
RDW: 14.4 % (ref 11.5–15.5)
WBC: 6 10*3/uL (ref 4.0–10.5)
nRBC: 0 % (ref 0.0–0.2)

## 2020-10-22 LAB — BASIC METABOLIC PANEL
Anion gap: 7 (ref 5–15)
BUN: 11 mg/dL (ref 8–23)
CO2: 23 mmol/L (ref 22–32)
Calcium: 8.3 mg/dL — ABNORMAL LOW (ref 8.9–10.3)
Chloride: 106 mmol/L (ref 98–111)
Creatinine, Ser: 1.34 mg/dL — ABNORMAL HIGH (ref 0.44–1.00)
GFR, Estimated: 41 mL/min — ABNORMAL LOW (ref >60)
Glucose, Bld: 126 mg/dL — ABNORMAL HIGH (ref 70–99)
Potassium: 3 mmol/L — ABNORMAL LOW (ref 3.5–5.1)
Sodium: 136 mmol/L (ref 135–145)

## 2020-10-22 LAB — HEPARIN LEVEL (UNFRACTIONATED)
Heparin Unfractionated: <0.1 IU/mL — ABNORMAL LOW (ref 0.30–0.70)
Heparin Unfractionated: 0.44 IU/mL (ref 0.30–0.70)
Heparin Unfractionated: 0.42 IU/mL (ref 0.30–0.70)

## 2020-10-22 LAB — BRAIN NATRIURETIC PEPTIDE: B Natriuretic Peptide: 1154.2 pg/mL — ABNORMAL HIGH (ref 0.0–100.0)

## 2020-10-22 MED ORDER — FUROSEMIDE 10 MG/ML IJ SOLN
40.0000 mg | Freq: Every day | INTRAMUSCULAR | Status: DC
  Administered 2020-10-22 – 2020-10-23 (×2): 40 mg via INTRAVENOUS
  Filled 2020-10-22 (×2): qty 4

## 2020-10-22 MED ORDER — TRAMADOL HCL 50 MG PO TABS
50.0000 mg | ORAL_TABLET | Freq: Three times a day (TID) | ORAL | Status: DC | PRN
  Administered 2020-10-22 – 2020-10-23 (×2): 50 mg via ORAL
  Filled 2020-10-22 (×2): qty 1

## 2020-10-22 MED ORDER — HEPARIN (PORCINE) 25000 UT/250ML-% IV SOLN
1000.0000 [IU]/h | INTRAVENOUS | Status: DC
  Administered 2020-10-22 – 2020-10-23 (×2): 900 [IU]/h via INTRAVENOUS
  Administered 2020-10-24: 1000 [IU]/h via INTRAVENOUS
  Filled 2020-10-22 (×3): qty 250

## 2020-10-22 MED ORDER — METOPROLOL SUCCINATE ER 25 MG PO TB24
25.0000 mg | ORAL_TABLET | Freq: Every day | ORAL | Status: DC
  Administered 2020-10-22 – 2020-10-24 (×3): 25 mg via ORAL
  Filled 2020-10-22 (×3): qty 1

## 2020-10-22 MED ORDER — PERFLUTREN LIPID MICROSPHERE
1.0000 mL | INTRAVENOUS | Status: AC | PRN
  Administered 2020-10-22: 6 mL via INTRAVENOUS
  Filled 2020-10-22: qty 10

## 2020-10-22 NOTE — Consult Note (Signed)
Reason for Consult:CAD with nonSTEMI Referring Physician: Dr. Riley Nearing is an 75 y.o. female.  HPI: Veronica Swanson is a 75 yo woman who presents with a cc/o hand, back and neck pain.  Veronica Swanson is a 76 yo woman with a past history of CAD, carotid disease, hypertension, hyperlipidemia and borderline diabetes.  She has been having pain for about 2 months. She describes pain in her chest, hands, neck and back. She presented to the ED with similar complaints with a sensation like someone was standing on her chest. She ruled in for a non-STEMI. At cath has severe CAD including left main disease. No chest pain or hand pain/ numbness at present.  Past Medical History:  Diagnosis Date  . Carotid artery occlusion   . Coronary artery disease   . Hyperlipidemia   . Hypertension   . Myocardial infarction (HCC) 1997    Past Surgical History:  Procedure Laterality Date  . ABDOMINAL HYSTERECTOMY    . CARDIAC CATHETERIZATION      Family History  Problem Relation Age of Onset  . Heart disease Mother   . Hypertension Father   . Heart disease Father   . Thyroid disease Neg Hx     Social History:  reports that she has been smoking cigarettes. She has never used smokeless tobacco. She reports that she does not drink alcohol and does not use drugs.  Allergies:  Allergies  Allergen Reactions  . Penicillins Other (See Comments)    Dizzy     Medications: Scheduled: . amLODipine  5 mg Oral q morning  . aspirin EC  162 mg Oral q morning  . latanoprost  1 drop Both Eyes QHS  . metoprolol succinate  25 mg Oral Daily  . rosuvastatin  40 mg Oral Daily  . sodium chloride flush  3 mL Intravenous Q12H    Results for orders placed or performed during the hospital encounter of 10/11/2020 (from the past 48 hour(s))  CBC     Status: None   Collection Time: 10/28/2020  1:33 PM  Result Value Ref Range   WBC 4.4 4.0 - 10.5 K/uL   RBC 4.60 3.87 - 5.11 MIL/uL   Hemoglobin 13.0 12.0 -  15.0 g/dL   HCT 61.6 83.7 - 29.0 %   MCV 85.9 80.0 - 100.0 fL   MCH 28.3 26.0 - 34.0 pg   MCHC 32.9 30.0 - 36.0 g/dL   RDW 21.1 15.5 - 20.8 %   Platelets 264 150 - 400 K/uL   nRBC 0.0 0.0 - 0.2 %    Comment: Performed at [facility], [address], [place], [place] [address]  Troponin I (High Sensitivity)     Status: Abnormal   Collection Time: 10/19/2020  1:33 PM  Result Value Ref Range   Troponin I (High Sensitivity) 2,047 (HH) <18 ng/L    Comment: CRITICAL RESULT CALLED TO, READ BACK BY AND VERIFIED WITH: Sula Rumple, RN [phone] 10/22/2020 L. KLAR (NOTE) Elevated high sensitivity troponin I (hsTnI) values and significant  changes across serial measurements may suggest ACS but many other  chronic and acute conditions are known to elevate hsTnI results.  Refer to the Links section for chest pain algorithms and additional  guidance. Performed at [facility], [address], [place], [place] [address]   Comprehensive metabolic panel     Status: Abnormal   Collection Time: 10/26/2020  1:33 PM  Result Value Ref Range   Sodium 137  135 - 145 mmol/L   Potassium 3.6 3.5 - 5.1 mmol/L   Chloride 103 98 - 111 mmol/L   CO2 23 22 - 32 mmol/L   Glucose, Bld 100 (H) 70 - 99 mg/dL    Comment: Glucose reference range applies only to samples taken after fasting for at least 8 hours.   BUN 16 8 - 23 mg/dL   Creatinine, Ser 0.25 (H) 0.44 - 1.00 mg/dL   Calcium 9.3 8.9 - 42.7 mg/dL   Total Protein 7.3 6.5 - 8.1 g/dL   Albumin 3.9 3.5 - 5.0 g/dL   AST 24 15 - 41 U/L   ALT 14 0 - 44 U/L   Alkaline Phosphatase 25 (L) 38 - 126 U/L   Total Bilirubin 0.5 0.3 - 1.2 mg/dL   GFR, Estimated 40 (L) >60 mL/min    Comment: (NOTE) Calculated using the CKD-EPI Creatinine Equation (2021)    Anion gap 11 5 - 15    Comment: Performed at [facility], [address], [place], [place] [address]  Differential     Status: Abnormal   Collection Time: 10/20/2020  1:33 PM  Result Value Ref Range    Neutrophils Relative % 35 %   Neutro Abs 1.5 (L) 1.7 - 7.7 K/uL   Lymphocytes Relative 55 %   Lymphs Abs 2.4 0.7 - 4.0 K/uL   Monocytes Relative 8 %   Monocytes Absolute 0.3 0.1 - 1.0 K/uL   Eosinophils Relative 1 %   Eosinophils Absolute 0.1 0.0 - 0.5 K/uL   Basophils Relative 1 %   Basophils Absolute 0.0 0.0 - 0.1 K/uL   Immature Granulocytes 0 %   Abs Immature Granulocytes 0.00 0.00 - 0.07 K/uL    Comment: Performed at [facility], [address] [place], [place] [address]  Resp Panel by RT-PCR (Flu A&B, Covid) Nasopharyngeal Swab     Status: None   Collection Time: 10/06/2020  3:53 PM   Specimen: Nasopharyngeal Swab; Nasopharyngeal(NP) swabs in vial transport medium  Result Value Ref Range   SARS Coronavirus 2 by RT PCR NEGATIVE NEGATIVE    Comment: (NOTE) SARS-CoV-2 target nucleic acids are NOT DETECTED.  The SARS-CoV-2 RNA is generally detectable in upper respiratory specimens during the acute phase of infection. The lowest concentration of SARS-CoV-2 viral copies this assay can detect is 138 copies/mL. A negative result does not preclude SARS-Cov-2 infection and should not be used as the sole basis for treatment or other patient management decisions. A negative result may occur with  improper specimen collection/handling, submission of specimen other than nasopharyngeal swab, presence of viral mutation(s) within the areas targeted by this assay, and inadequate number of viral copies(<138 copies/mL). A negative result must be combined with clinical observations, patient history, and epidemiological information. The expected result is Negative.  Fact Sheet for Patients:  BloggerCourse.com  Fact Sheet for Healthcare Providers:  SeriousBroker.it  This test is no t yet approved or cleared by the [place] FDA and  has been authorized for detection and/or diagnosis of SARS-CoV-2 by FDA under an Emergency Use  Authorization (EUA). This EUA will remain  in effect (meaning this test can be used) for the duration of the COVID-19 declaration under Section 564(b)(1) of the Act, 21 U.S.C.section 360bbb-3(b)(1), unless the authorization is terminated  or revoked sooner.       Influenza A by PCR NEGATIVE NEGATIVE   Influenza B by PCR NEGATIVE NEGATIVE    Comment: (NOTE) The Xpert Xpress  SARS-CoV-2/FLU/RSV plus assay is intended as an aid in the diagnosis of influenza from Nasopharyngeal swab specimens and should not be used as a sole basis for treatment. Nasal washings and aspirates are unacceptable for Xpert Xpress SARS-CoV-2/FLU/RSV testing.  Fact Sheet for Patients: https://www.fda.gov/media/152166/download  Fact Sheet for Healthcare Providers: https://www.fda.gov/media/152162/download  This test is not yet approved or cleared by the [place] FDA and has been authorized for detection and/or diagnosis of SARS-CoV-2 by FDA under an Emergency Use Authorization (EUA). This EUA will remain in effect (meaning this test can be used) for the duration of the COVID-19 declaration under Section 564(b)(1) of the Act, 21 U.S.C. section 360bbb-3(b)(1), unless the authorization is terminated or revoked.  Performed at [facility], [address] [place], [place] [address]   Troponin I (High Sensitivity)     Status: Abnormal   Collection Time: 10/24/2020  4:07 PM  Result Value Ref Range   Troponin I (High Sensitivity) 2,098 (HH) <18 ng/L    Comment: CRITICAL VALUE NOTED.  VALUE IS CONSISTENT WITH PREVIOUSLY REPORTED AND CALLED VALUE. (NOTE) Elevated high sensitivity troponin I (hsTnI) values and significant  changes across serial measurements may suggest ACS but many other  chronic and acute conditions are known to elevate hsTnI results.  Refer to the Links section for chest pain algorithms and additional  guidance. Performed at [facility], [address] [place],  [place] [address]   Glucose, capillary     Status: None   Collection Time: 10/25/2020  5:17 PM  Result Value Ref Range   Glucose-Capillary 76 70 - 99 mg/dL    Comment: Glucose reference range applies only to samples taken after fasting for at least 8 hours.  Hemoglobin A1c     Status: Abnormal   Collection Time: 10/17/2020  6:58 PM  Result Value Ref Range   Hgb A1c MFr Bld 6.1 (H) 4.8 - 5.6 %    Comment: (NOTE) Pre diabetes:          5.7%-6.4%  Diabetes:              >6.4%  Glycemic control for   <7.0% adults with diabetes    Mean Plasma Glucose 128.37 mg/dL    Comment: Performed at [facility], [address] [place], [place] [address]  Lipid panel     Status: None   Collection Time: 11/04/2020  6:58 PM  Result Value Ref Range   Cholesterol 123 0 - 200 mg/dL   Triglycerides 48 <150 mg/dL   HDL 48 >40 mg/dL   Total CHOL/HDL Ratio 2.6 RATIO   VLDL 10 0 - 40 mg/dL   LDL Cholesterol 65 0 - 99 mg/dL    Comment:        Total Cholesterol/HDL:CHD Risk Coronary Heart Disease Risk Table                     Men   Women  1/2 Average Risk   3.4   3.3  Average Risk       5.0   4.4  2 X Average Risk   9.6   7.1  3 X Average Risk  23.4   11.0        Use the calculated Patient Ratio above and the CHD Risk Table to determine the patient's CHD Risk.        ATP III CLASSIFICATION (LDL):  <100     mg/dL   Optimal  100-129  mg/dL     Near or Above                    Optimal  130-159  mg/dL   Borderline  423-536  mg/dL   High  >144     mg/dL   Very High Performed at [facility], [address] [place], [place] [address]   Heparin level (unfractionated)     Status: Abnormal   Collection Time: 10/22/20 12:51 AM  Result Value Ref Range   Heparin Unfractionated <0.10 (L) 0.30 - 0.70 IU/mL    Comment: (NOTE) The clinical reportable range upper limit is being lowered to >1.10 to align with the FDA approved guidance for the current laboratory assay.  If heparin results are below  expected values, and patient dosage has  been confirmed, suggest follow up testing of antithrombin III levels. Performed at [facility], [address] [place], [place] [address]   CBC     Status: None   Collection Time: 10/22/20 12:51 AM  Result Value Ref Range   WBC 6.0 4.0 - 10.5 K/uL   RBC 4.38 3.87 - 5.11 MIL/uL   Hemoglobin 12.2 12.0 - 15.0 g/dL   HCT 19.5 09.3 - 26.7 %   MCV 85.4 80.0 - 100.0 fL   MCH 27.9 26.0 - 34.0 pg   MCHC 32.6 30.0 - 36.0 g/dL   RDW 12.4 58.0 - 99.8 %   Platelets 246 150 - 400 K/uL   nRBC 0.0 0.0 - 0.2 %    Comment: Performed at [facility], [address] [place], [place] [address]  Heparin level (unfractionated)     Status: None   Collection Time: 10/22/20  9:54 AM  Result Value Ref Range   Heparin Unfractionated 0.44 0.30 - 0.70 IU/mL    Comment: (NOTE) The clinical reportable range upper limit is being lowered to >1.10 to align with the FDA approved guidance for the current laboratory assay.  If heparin results are below expected values, and patient dosage has  been confirmed, suggest follow up testing of antithrombin III levels. Performed at [facility], [address] [place], [place] [address]   Brain natriuretic peptide     Status: Abnormal   Collection Time: 10/22/20  9:54 AM  Result Value Ref Range   B Natriuretic Peptide 1,154.2 (H) 0.0 - 100.0 pg/mL    Comment: Performed at [facility], [address], [place], [place] [address]    DG Chest 2 View  Result Date: 10/09/2020 CLINICAL DATA:  Chest pain and shortness of breath. EXAM: CHEST - 2 VIEW COMPARISON:  None. FINDINGS: The heart size and mediastinal contours are within normal limits. Atherosclerotic calcification of the aortic arch. Normal pulmonary vascularity. No focal consolidation, pleural effusion, or pneumothorax. Mild linear scarring in the posterior right lower lobe. No acute osseous abnormality. IMPRESSION: 1. No acute cardiopulmonary  disease. Electronically Signed   By: Obie Dredge M.D.   On: 10/11/2020 14:13   CARDIAC CATHETERIZATION  Addendum Date: 11/03/2020   LM: Severe calcification        Ostial-mid 80% stenosis LAD: Tortuous take off          Tortuous vessel          Distal diffuse 70% disease          Rest of the vessel and diags are moderate caliber without any significant disease Lcx: Prox 70% disease with severe calcification, followed by 100% occlusion prior to  mid vessel stent        Retrograde filling from RCA/PDA        Large mid Lcx stent with 90% ISR RCA: Tortuous vessel          Distal PLA occlusion          Rest of the vessel without any significant disease Normal LVEDP Severe tortuosity of right brachiocephalic artery and ascending aorta Severe calcification of aortic knuckle Severe multivessel CAD Aspirin/heparin/nitroglycerin CVTS consulted for CABG Veronica Negus, MD Pager: [phone] Office: [phone]   Result Date: 10/06/2020 Images from the original result were not included. LM: Severe calcification        Ostial-mid 80% stenosis LAD: Tortuous take off          Tortuous vessel          Distal diffuse 70% disease          Rest of the vessel and diags are moderate caliber without any significant disease Lcx: Prox 70% disease with severe calcification, followed by 100% occlusion prior to mid vessel stent        Retrograde filling from RCA/PDA        Large mid Lcx stent with 90% ISR RCA: Tortuous vessel          Distal PLA occlusion          Rest of the vessel without any significant disease Normal LVEDP Severe tortuosity of right brachiocephalic artery and ascending aorta Severe calcification of aortic knuckle Severe multivessel CAD Aspirin/heparin/nitroglycerin CVTS consulted for CABG Veronica Negus, MD Pager: [phone] Office: [phone]    I personally reviewed the cath images. Ostial left main, 100% circumflex at previous stent, LAD totally occluded with reconstitution in mid point  perfusing a diagonal vessel with ISR in the reconstituted segment. Large RCA providing collaterals with no significant disease until total occlusion before a PL branch.  Review of Systems  Constitutional:  Positive for activity change. Negative for unexpected weight change.  HENT:  Negative for trouble swallowing and voice change.   Eyes:  Negative for visual disturbance.  Respiratory:  Positive for chest tightness and shortness of breath.   Cardiovascular:  Positive for chest pain and leg swelling.  Genitourinary:  Negative for difficulty urinating and dysuria.  Musculoskeletal:  Positive for back pain.  Neurological:  Negative for syncope, speech difficulty and weakness.  Blood pressure 120/67, pulse 71, temperature (!) 97.5 F (36.4 C), temperature source Oral, resp. rate 16, height 5\' 7"  (1.702 m), weight 68 kg, SpO2 96 %. Physical Exam Vitals reviewed.  Constitutional:      General: She is not in acute distress.    Appearance: She is well-developed.  HENT:     Head: Normocephalic and atraumatic.  Eyes:     General: No scleral icterus.    Extraocular Movements: Extraocular movements intact.  Neck:     Vascular: Carotid bruit (loud left carotid bruit) present.  Cardiovascular:     Rate and Rhythm: Normal rate and regular rhythm.     Heart sounds: Normal heart sounds. No murmur heard.   No friction rub. No gallop.     Comments: No palpable DP or PT Pulmonary:     Effort: Pulmonary effort is normal.     Breath sounds: Normal breath sounds. No wheezing or rales.  Abdominal:     General: There is no distension.     Palpations: Abdomen is soft.  Musculoskeletal:     Cervical back: Neck supple.  Right lower leg: Edema present.     Left lower leg: Edema present.  Skin:    General: Skin is warm and dry.  Neurological:     General: No focal deficit present.     Mental Status: She is alert and oriented to person, place, and time.     Cranial Nerves: No cranial nerve deficit.      Motor: No weakness.    Assessment/Plan: Veronica Swanson is a 75 yo woman with known atherosclerotic disease including carotid and coronary artery disease, who presents with unstable CP and ruled in for a non-STEMI. Cath shows left main disease. CABG indicated for survival benefit and relief of symptoms.  I have discussed the general nature of CABG with Veronica Swanson including the need for general anesthesia, the incisions to be used, the use of cardiopulmonary bypass, drainage tubes and temporary pacing wires. We discussed the expected hospital stay, overall recovery and short and long term outcomes. I informed her of the indications, risks, benefits and alternatives.  She understands the risks include, but are not limited to death, stroke, MI, DVT/PE, bleeding, possible need for transfusion, infections, cardiac arrhythmias, as well as other organ system dysfunction including respiratory, renal, or GI complications.  Carotid bruit, known ECCOD- carotid duplex  PAD- will have vascular lab check ABIs  OR schedule is currently full and will have to rearrange to accommodate her case. Hopefully can do Tues or Wed this week. Veronica Swanson 10/22/2020, 12:25 PM

## 2020-10-22 NOTE — Progress Notes (Signed)
TR BAND REMOVAL  LOCATION:    right radial  DEFLATED PER PROTOCOL:    Yes.    TIME BAND OFF / DRESSING APPLIED:    2000   SITE UPON ARRIVAL:    Level 0  SITE AFTER BAND REMOVAL:    Level 0  CIRCULATION SENSATION AND MOVEMENT:    Within Normal Limits   Yes.    COMMENTS:   Pt.tolerated procedure well 

## 2020-10-22 NOTE — Progress Notes (Signed)
  Echocardiogram 2D Echocardiogram has been performed.  Gerda Diss 10/22/2020, 12:25 PM

## 2020-10-22 NOTE — Progress Notes (Signed)
ANTICOAGULATION CONSULT NOTE - Follow Up Consult  Pharmacy Consult for heparin Indication:  CAD awaiting CVTS consult  Labs: Recent Labs    10/07/2020 1333 10/28/2020 1607 10/22/20 0051 10/22/20 0954 10/22/20 1759  HGB 13.0  --  12.2  --   --   HCT 39.5  --  37.4  --   --   PLT 264  --  246  --   --   HEPARINUNFRC  --   --  <0.10* 0.44 0.42  CREATININE 1.38*  --   --   --  1.34*  TROPONINIHS 2,047* 2,098*  --   --   --    Assessment: 75yo female went to cath for NSTEMI prior to heparin being initiated; per cath notes CVTS will be consulted for possible CABG.   - heparin level is therapeutic (0.42) @ 900 units/hr.   Goal of Therapy:  Heparin level 0.3-0.7 units/ml Monitor platelets by anticoagulation protocol: Yes    Plan:  Continue heparin infusion at 900 units/hr. Monitor CBC, HL  Harland German, PharmD Clinical Pharmacist **Pharmacist phone directory can now be found on [email or web address] ([email or web address]).  Listed under [facility].

## 2020-10-22 NOTE — Progress Notes (Signed)
  Echo attempted. Patient eating. Will attempt again as schedule and time permits.  Gerda Diss 10/22/2020, 9:18 AM

## 2020-10-22 NOTE — Progress Notes (Signed)
Subjective:  Feels well this morning. Had chest, and back pain overnight, improved with IV nitroglycerin, as well as p.o. tramadol.  Patient sister Clotilde Dieter and Bonita Quin are at bedside.  Objective:  Vital Signs in the last 24 hours: Temp:  [97.5 F (36.4 C)-98.2 F (36.8 C)] 97.5 F (36.4 C) (09/17 1025) Pulse Rate:  [63-99] 71 (09/17 1025) Resp:  [0-35] 16 (09/17 1025) BP: (114-150)/(57-92) 120/67 (09/17 1025) SpO2:  [92 %-100 %] 96 % (09/17 1025) Weight:  [68 kg] 68 kg (09/16 1600)  Intake/Output from previous day: 09/16 0701 - 09/17 0700 In: 346 [P.O.:120; I.V.:226] Out: 350 [Urine:350]  Physical Exam Vitals and nursing note reviewed.  Constitutional:      General: She is not in acute distress.    Appearance: She is well-developed.  HENT:     Head: Normocephalic and atraumatic.  Eyes:     Conjunctiva/sclera: Conjunctivae normal.     Pupils: Pupils are equal, round, and reactive to light.  Neck:     Vascular: No JVD.  Cardiovascular:     Rate and Rhythm: Normal rate and regular rhythm.     Pulses: Intact distal pulses.          Dorsalis pedis pulses are 0 on the right side and 0 on the left side.       Posterior tibial pulses are 0 on the right side and 0 on the left side.     Heart sounds: No murmur heard.    Comments: On peripheral pulses, including femoral pulses are difficult to appreciate. No critical limb ischemia on exam. Pulmonary:     Effort: Pulmonary effort is normal.     Breath sounds: Normal breath sounds. No wheezing or rales.  Abdominal:     General: Bowel sounds are normal.     Palpations: Abdomen is soft.     Tenderness: There is no rebound.  Musculoskeletal:        General: No tenderness. Normal range of motion.     Right lower leg: No edema.     Left lower leg: No edema.  Lymphadenopathy:     Cervical: No cervical adenopathy.  Skin:    General: Skin is warm and dry.  Neurological:     Mental Status: She is alert and oriented to person, place,  and time.     Cranial Nerves: No cranial nerve deficit.     Lab Results: BMP Recent Labs    12/07/19 1316 10/11/2020 1333  NA 141 137  K 4.3 3.6  CL 100 103  CO2 26 23  GLUCOSE 109* 100*  BUN 20 16  CREATININE 1.27* 1.38*  CALCIUM 9.6 9.3  GFRNONAA 41* 40*  GFRAA 48*  --     CBC Recent Labs  Lab 11/02/2020 1333 10/22/20 0051  WBC 4.4 6.0  RBC 4.60 4.38  HGB 13.0 12.2  HCT 39.5 37.4  PLT 264 246  MCV 85.9 85.4  MCH 28.3 27.9  MCHC 32.9 32.6  RDW 14.5 14.4  LYMPHSABS 2.4  --   MONOABS 0.3  --   EOSABS 0.1  --   BASOSABS 0.0  --     HEMOGLOBIN A1C Lab Results  Component Value Date   HGBA1C 6.1 (H) 10/24/2020   MPG 128.37 10/13/2020    Cardiac Panel (last 3 results) Results for ARTISHA, CAPRI (MRN [id number]) as of 10/22/2020 12:03  Ref. Range 11/03/2020 13:33 10/29/2020 16:07  Troponin I (High Sensitivity) Latest Ref Range: <18 ng/L 2,047 [facility]) 2,098 (HH)  Lipid Panel     Component Value Date/Time   CHOL 123 10/15/2020 1858   CHOL 139 12/07/2019 1316   TRIG 48 10/18/2020 1858   HDL 48 10/25/2020 1858   HDL 35 (L) 12/07/2019 1316   CHOLHDL 2.6 10/09/2020 1858   VLDL 10 10/17/2020 1858   LDLCALC 65 10/20/2020 1858   LDLCALC 78 12/07/2019 1316   LDLDIRECT 70 12/07/2019 1316     Hepatic Function Panel Recent Labs    12/07/19 1316 10/12/2020 1333  PROT 7.8 7.3  ALBUMIN 4.6 3.9  AST 18 24  ALT 11 14  ALKPHOS 41* 25*  BILITOT <0.2 0.5    Cardiac Studies:  EKG 11/04/2020: Sinus rhythm.   Anterior infarct, age indeterminate Mild ST elevation in lead V1, aVR does not meet STEMI criteria. Diffuse ischemic changes suspicious for left main or multivessel ischemia   Echocardiogram 2019: 1. Left ventricle cavity is normal in size. Mild concentric hypertrophy of the left ventricle. Normal global wall motion. Visual EF is 60-65%. Doppler evidence of grade I (impaired) diastolic dysfunction. 2. Mild (Grade I) mitral regurgitation. Mild prolapse of  posterior mitral valve leaflet. 3. Trace tricuspid regurgitation     Assessment/Plan   75 y.o. African American female  with hypertension, hyperlipidemia, CKD 3, prediabetes, former smoker, NSTEMI, severe multivessel disease   NSTEMI: Currently chest pain-free on low-dose nitroglycerin. Flat troponin around 2000. Severe multivessel disease including ostial LM Severe calcification of tortuous aorta.   Continue aspirin, statin, heparin, IV nitroglycerin. Started low-dose metoprolol succinate 25 mg daily. Echocardiogram, BNP pending. Will check carotid duplex and lower extremity duplex.  Suspect she does have significant PAD. Discussed with Dr. Dorris Fetch.  Appreciate consult.    Hypertension: Controlled  CKD stage IIIb: GFR stable around 40     Elder Negus, MD Pager: [phone] Office: [phone]

## 2020-10-22 NOTE — Progress Notes (Addendum)
ANTICOAGULATION CONSULT NOTE - Follow Up Consult  Pharmacy Consult for heparin Indication:  CAD awaiting CVTS consult  Labs: Recent Labs    10/26/2020 1333 10/30/2020 1607  HGB 13.0  --   HCT 39.5  --   PLT 264  --   CREATININE 1.38*  --   TROPONINIHS 2,047* 2,098*    Assessment: 75yo female went to cath for NSTEMI prior to heparin being initiated; per cath notes CVTS will be consulted for possible CABG, heparin in the meantime; sheath was removed ~5p, TR band removed ~9p.  Goal of Therapy:  Heparin level 0.3-0.7 units/ml   Plan:  Will start heparin infusion at 900 units/hr and check level in 8 hours.    Vernard Gambles, PharmD, BCPS  10/22/2020,1:17 AM

## 2020-10-22 NOTE — Plan of Care (Signed)
  Problem: Activity: Goal: Risk for activity intolerance will decrease Outcome: Progressing   Problem: Nutrition: Goal: Adequate nutrition will be maintained Outcome: Progressing   Problem: Coping: Goal: Level of anxiety will decrease Outcome: Progressing   

## 2020-10-22 NOTE — H&P (View-Only) (Signed)
Reason for Consult:CAD with nonSTEMI Referring Physician: Dr. Riley Nearing is an 75 y.o. female.  HPI: Veronica Swanson is a 75 yo woman who presents with a cc/o hand, back and neck pain.  Veronica Swanson is a 76 yo woman with a past history of CAD, carotid disease, hypertension, hyperlipidemia and borderline diabetes.  She has been having pain for about 2 months. She describes pain in her chest, hands, neck and back. She presented to the ED with similar complaints with a sensation like someone was standing on her chest. She ruled in for a non-STEMI. At cath has severe CAD including left main disease. No chest pain or hand pain/ numbness at present.  Past Medical History:  Diagnosis Date  . Carotid artery occlusion   . Coronary artery disease   . Hyperlipidemia   . Hypertension   . Myocardial infarction (HCC) 1997    Past Surgical History:  Procedure Laterality Date  . ABDOMINAL HYSTERECTOMY    . CARDIAC CATHETERIZATION      Family History  Problem Relation Age of Onset  . Heart disease Mother   . Hypertension Father   . Heart disease Father   . Thyroid disease Neg Hx     Social History:  reports that she has been smoking cigarettes. She has never used smokeless tobacco. She reports that she does not drink alcohol and does not use drugs.  Allergies:  Allergies  Allergen Reactions  . Penicillins Other (See Comments)    Dizzy     Medications: Scheduled: . amLODipine  5 mg Oral q morning  . aspirin EC  162 mg Oral q morning  . latanoprost  1 drop Both Eyes QHS  . metoprolol succinate  25 mg Oral Daily  . rosuvastatin  40 mg Oral Daily  . sodium chloride flush  3 mL Intravenous Q12H    Results for orders placed or performed during the hospital encounter of 10/11/2020 (from the past 48 hour(s))  CBC     Status: None   Collection Time: 10/28/2020  1:33 PM  Result Value Ref Range   WBC 4.4 4.0 - 10.5 K/uL   RBC 4.60 3.87 - 5.11 MIL/uL   Hemoglobin 13.0 12.0 -  15.0 g/dL   HCT 61.6 83.7 - 29.0 %   MCV 85.9 80.0 - 100.0 fL   MCH 28.3 26.0 - 34.0 pg   MCHC 32.9 30.0 - 36.0 g/dL   RDW 21.1 15.5 - 20.8 %   Platelets 264 150 - 400 K/uL   nRBC 0.0 0.0 - 0.2 %    Comment: Performed at [facility], [address], [place], [place] [address]  Troponin I (High Sensitivity)     Status: Abnormal   Collection Time: 10/19/2020  1:33 PM  Result Value Ref Range   Troponin I (High Sensitivity) 2,047 (HH) <18 ng/L    Comment: CRITICAL RESULT CALLED TO, READ BACK BY AND VERIFIED WITH: Sula Rumple, RN [phone] 10/22/2020 L. KLAR (NOTE) Elevated high sensitivity troponin I (hsTnI) values and significant  changes across serial measurements may suggest ACS but many other  chronic and acute conditions are known to elevate hsTnI results.  Refer to the Links section for chest pain algorithms and additional  guidance. Performed at [facility], [address], [place], [place] [address]   Comprehensive metabolic panel     Status: Abnormal   Collection Time: 10/26/2020  1:33 PM  Result Value Ref Range   Sodium 137  135 - 145 mmol/L   Potassium 3.6 3.5 - 5.1 mmol/L   Chloride 103 98 - 111 mmol/L   CO2 23 22 - 32 mmol/L   Glucose, Bld 100 (H) 70 - 99 mg/dL    Comment: Glucose reference range applies only to samples taken after fasting for at least 8 hours.   BUN 16 8 - 23 mg/dL   Creatinine, Ser 0.25 (H) 0.44 - 1.00 mg/dL   Calcium 9.3 8.9 - 42.7 mg/dL   Total Protein 7.3 6.5 - 8.1 g/dL   Albumin 3.9 3.5 - 5.0 g/dL   AST 24 15 - 41 U/L   ALT 14 0 - 44 U/L   Alkaline Phosphatase 25 (L) 38 - 126 U/L   Total Bilirubin 0.5 0.3 - 1.2 mg/dL   GFR, Estimated 40 (L) >60 mL/min    Comment: (NOTE) Calculated using the CKD-EPI Creatinine Equation (2021)    Anion gap 11 5 - 15    Comment: Performed at [facility], [address], [place], [place] [address]  Differential     Status: Abnormal   Collection Time: 10/20/2020  1:33 PM  Result Value Ref Range    Neutrophils Relative % 35 %   Neutro Abs 1.5 (L) 1.7 - 7.7 K/uL   Lymphocytes Relative 55 %   Lymphs Abs 2.4 0.7 - 4.0 K/uL   Monocytes Relative 8 %   Monocytes Absolute 0.3 0.1 - 1.0 K/uL   Eosinophils Relative 1 %   Eosinophils Absolute 0.1 0.0 - 0.5 K/uL   Basophils Relative 1 %   Basophils Absolute 0.0 0.0 - 0.1 K/uL   Immature Granulocytes 0 %   Abs Immature Granulocytes 0.00 0.00 - 0.07 K/uL    Comment: Performed at [facility], [address] [place], [place] [address]  Resp Panel by RT-PCR (Flu A&B, Covid) Nasopharyngeal Swab     Status: None   Collection Time: 10/06/2020  3:53 PM   Specimen: Nasopharyngeal Swab; Nasopharyngeal(NP) swabs in vial transport medium  Result Value Ref Range   SARS Coronavirus 2 by RT PCR NEGATIVE NEGATIVE    Comment: (NOTE) SARS-CoV-2 target nucleic acids are NOT DETECTED.  The SARS-CoV-2 RNA is generally detectable in upper respiratory specimens during the acute phase of infection. The lowest concentration of SARS-CoV-2 viral copies this assay can detect is 138 copies/mL. A negative result does not preclude SARS-Cov-2 infection and should not be used as the sole basis for treatment or other patient management decisions. A negative result may occur with  improper specimen collection/handling, submission of specimen other than nasopharyngeal swab, presence of viral mutation(s) within the areas targeted by this assay, and inadequate number of viral copies(<138 copies/mL). A negative result must be combined with clinical observations, patient history, and epidemiological information. The expected result is Negative.  Fact Sheet for Patients:  BloggerCourse.com  Fact Sheet for Healthcare Providers:  SeriousBroker.it  This test is no t yet approved or cleared by the [place] FDA and  has been authorized for detection and/or diagnosis of SARS-CoV-2 by FDA under an Emergency Use  Authorization (EUA). This EUA will remain  in effect (meaning this test can be used) for the duration of the COVID-19 declaration under Section 564(b)(1) of the Act, 21 U.S.C.section 360bbb-3(b)(1), unless the authorization is terminated  or revoked sooner.       Influenza A by PCR NEGATIVE NEGATIVE   Influenza B by PCR NEGATIVE NEGATIVE    Comment: (NOTE) The Xpert Xpress  SARS-CoV-2/FLU/RSV plus assay is intended as an aid in the diagnosis of influenza from Nasopharyngeal swab specimens and should not be used as a sole basis for treatment. Nasal washings and aspirates are unacceptable for Xpert Xpress SARS-CoV-2/FLU/RSV testing.  Fact Sheet for Patients: BloggerCourse.com  Fact Sheet for Healthcare Providers: SeriousBroker.it  This test is not yet approved or cleared by the [place] FDA and has been authorized for detection and/or diagnosis of SARS-CoV-2 by FDA under an Emergency Use Authorization (EUA). This EUA will remain in effect (meaning this test can be used) for the duration of the COVID-19 declaration under Section 564(b)(1) of the Act, 21 U.S.C. section 360bbb-3(b)(1), unless the authorization is terminated or revoked.  Performed at [facility], [address], [place], [place] [address]   Troponin I (High Sensitivity)     Status: Abnormal   Collection Time: 10/19/2020  4:07 PM  Result Value Ref Range   Troponin I (High Sensitivity) 2,098 (HH) <18 ng/L    Comment: CRITICAL VALUE NOTED.  VALUE IS CONSISTENT WITH PREVIOUSLY REPORTED AND CALLED VALUE. (NOTE) Elevated high sensitivity troponin I (hsTnI) values and significant  changes across serial measurements may suggest ACS but many other  chronic and acute conditions are known to elevate hsTnI results.  Refer to the Links section for chest pain algorithms and additional  guidance. Performed at [facility], [address], [place],  [place] [address]   Glucose, capillary     Status: None   Collection Time: 10/26/2020  5:17 PM  Result Value Ref Range   Glucose-Capillary 76 70 - 99 mg/dL    Comment: Glucose reference range applies only to samples taken after fasting for at least 8 hours.  Hemoglobin A1c     Status: Abnormal   Collection Time: 10/18/2020  6:58 PM  Result Value Ref Range   Hgb A1c MFr Bld 6.1 (H) 4.8 - 5.6 %    Comment: (NOTE) Pre diabetes:          5.7%-6.4%  Diabetes:              >6.4%  Glycemic control for   <7.0% adults with diabetes    Mean Plasma Glucose 128.37 mg/dL    Comment: Performed at [facility], [address], [place], [place] [address]  Lipid panel     Status: None   Collection Time: 10/20/2020  6:58 PM  Result Value Ref Range   Cholesterol 123 0 - 200 mg/dL   Triglycerides 48 <914 mg/dL   HDL 48 >78 mg/dL   Total CHOL/HDL Ratio 2.6 RATIO   VLDL 10 0 - 40 mg/dL   LDL Cholesterol 65 0 - 99 mg/dL    Comment:        Total Cholesterol/HDL:CHD Risk Coronary Heart Disease Risk Table                     Men   Women  1/2 Average Risk   3.4   3.3  Average Risk       5.0   4.4  2 X Average Risk   9.6   7.1  3 X Average Risk  23.4   11.0        Use the calculated Patient Ratio above and the CHD Risk Table to determine the patient's CHD Risk.        ATP III CLASSIFICATION (LDL):  <100     mg/dL   Optimal  295-621  mg/dL  Near or Above                    Optimal  130-159  mg/dL   Borderline  423-536  mg/dL   High  >144     mg/dL   Very High Performed at [facility], [address] [place], [place] [address]   Heparin level (unfractionated)     Status: Abnormal   Collection Time: 10/22/20 12:51 AM  Result Value Ref Range   Heparin Unfractionated <0.10 (L) 0.30 - 0.70 IU/mL    Comment: (NOTE) The clinical reportable range upper limit is being lowered to >1.10 to align with the FDA approved guidance for the current laboratory assay.  If heparin results are below  expected values, and patient dosage has  been confirmed, suggest follow up testing of antithrombin III levels. Performed at [facility], [address] [place], [place] [address]   CBC     Status: None   Collection Time: 10/22/20 12:51 AM  Result Value Ref Range   WBC 6.0 4.0 - 10.5 K/uL   RBC 4.38 3.87 - 5.11 MIL/uL   Hemoglobin 12.2 12.0 - 15.0 g/dL   HCT 19.5 09.3 - 26.7 %   MCV 85.4 80.0 - 100.0 fL   MCH 27.9 26.0 - 34.0 pg   MCHC 32.6 30.0 - 36.0 g/dL   RDW 12.4 58.0 - 99.8 %   Platelets 246 150 - 400 K/uL   nRBC 0.0 0.0 - 0.2 %    Comment: Performed at [facility], [address] [place], [place] [address]  Heparin level (unfractionated)     Status: None   Collection Time: 10/22/20  9:54 AM  Result Value Ref Range   Heparin Unfractionated 0.44 0.30 - 0.70 IU/mL    Comment: (NOTE) The clinical reportable range upper limit is being lowered to >1.10 to align with the FDA approved guidance for the current laboratory assay.  If heparin results are below expected values, and patient dosage has  been confirmed, suggest follow up testing of antithrombin III levels. Performed at [facility], [address] [place], [place] [address]   Brain natriuretic peptide     Status: Abnormal   Collection Time: 10/22/20  9:54 AM  Result Value Ref Range   B Natriuretic Peptide 1,154.2 (H) 0.0 - 100.0 pg/mL    Comment: Performed at [facility], [address], [place], [place] [address]    DG Chest 2 View  Result Date: 10/09/2020 CLINICAL DATA:  Chest pain and shortness of breath. EXAM: CHEST - 2 VIEW COMPARISON:  None. FINDINGS: The heart size and mediastinal contours are within normal limits. Atherosclerotic calcification of the aortic arch. Normal pulmonary vascularity. No focal consolidation, pleural effusion, or pneumothorax. Mild linear scarring in the posterior right lower lobe. No acute osseous abnormality. IMPRESSION: 1. No acute cardiopulmonary  disease. Electronically Signed   By: Obie Dredge M.D.   On: 10/11/2020 14:13   CARDIAC CATHETERIZATION  Addendum Date: 11/03/2020   LM: Severe calcification        Ostial-mid 80% stenosis LAD: Tortuous take off          Tortuous vessel          Distal diffuse 70% disease          Rest of the vessel and diags are moderate caliber without any significant disease Lcx: Prox 70% disease with severe calcification, followed by 100% occlusion prior to  mid vessel stent        Retrograde filling from RCA/PDA        Large mid Lcx stent with 90% ISR RCA: Tortuous vessel          Distal PLA occlusion          Rest of the vessel without any significant disease Normal LVEDP Severe tortuosity of right brachiocephalic artery and ascending aorta Severe calcification of aortic knuckle Severe multivessel CAD Aspirin/heparin/nitroglycerin CVTS consulted for CABG Elder Negus, MD Pager: [phone] Office: [phone]   Result Date: 10/06/2020 Images from the original result were not included. LM: Severe calcification        Ostial-mid 80% stenosis LAD: Tortuous take off          Tortuous vessel          Distal diffuse 70% disease          Rest of the vessel and diags are moderate caliber without any significant disease Lcx: Prox 70% disease with severe calcification, followed by 100% occlusion prior to mid vessel stent        Retrograde filling from RCA/PDA        Large mid Lcx stent with 90% ISR RCA: Tortuous vessel          Distal PLA occlusion          Rest of the vessel without any significant disease Normal LVEDP Severe tortuosity of right brachiocephalic artery and ascending aorta Severe calcification of aortic knuckle Severe multivessel CAD Aspirin/heparin/nitroglycerin CVTS consulted for CABG Elder Negus, MD Pager: [phone] Office: [phone]    I personally reviewed the cath images. Ostial left main, 100% circumflex at previous stent, LAD totally occluded with reconstitution in mid point  perfusing a diagonal vessel with ISR in the reconstituted segment. Large RCA providing collaterals with no significant disease until total occlusion before a PL branch.  Review of Systems  Constitutional:  Positive for activity change. Negative for unexpected weight change.  HENT:  Negative for trouble swallowing and voice change.   Eyes:  Negative for visual disturbance.  Respiratory:  Positive for chest tightness and shortness of breath.   Cardiovascular:  Positive for chest pain and leg swelling.  Genitourinary:  Negative for difficulty urinating and dysuria.  Musculoskeletal:  Positive for back pain.  Neurological:  Negative for syncope, speech difficulty and weakness.  Blood pressure 120/67, pulse 71, temperature (!) 97.5 F (36.4 C), temperature source Oral, resp. rate 16, height 5\' 7"  (1.702 m), weight 68 kg, SpO2 96 %. Physical Exam Vitals reviewed.  Constitutional:      General: She is not in acute distress.    Appearance: She is well-developed.  HENT:     Head: Normocephalic and atraumatic.  Eyes:     General: No scleral icterus.    Extraocular Movements: Extraocular movements intact.  Neck:     Vascular: Carotid bruit (loud left carotid bruit) present.  Cardiovascular:     Rate and Rhythm: Normal rate and regular rhythm.     Heart sounds: Normal heart sounds. No murmur heard.   No friction rub. No gallop.     Comments: No palpable DP or PT Pulmonary:     Effort: Pulmonary effort is normal.     Breath sounds: Normal breath sounds. No wheezing or rales.  Abdominal:     General: There is no distension.     Palpations: Abdomen is soft.  Musculoskeletal:     Cervical back: Neck supple.  Right lower leg: Edema present.     Left lower leg: Edema present.  Skin:    General: Skin is warm and dry.  Neurological:     General: No focal deficit present.     Mental Status: She is alert and oriented to person, place, and time.     Cranial Nerves: No cranial nerve deficit.      Motor: No weakness.    Assessment/Plan: Mrs. Contino is a 75 yo woman with known atherosclerotic disease including carotid and coronary artery disease, who presents with unstable CP and ruled in for a non-STEMI. Cath shows left main disease. CABG indicated for survival benefit and relief of symptoms.  I have discussed the general nature of CABG with Ms. Sawka including the need for general anesthesia, the incisions to be used, the use of cardiopulmonary bypass, drainage tubes and temporary pacing wires. We discussed the expected hospital stay, overall recovery and short and long term outcomes. I informed her of the indications, risks, benefits and alternatives.  She understands the risks include, but are not limited to death, stroke, MI, DVT/PE, bleeding, possible need for transfusion, infections, cardiac arrhythmias, as well as other organ system dysfunction including respiratory, renal, or GI complications.  Carotid bruit, known ECCOD- carotid duplex  PAD- will have vascular lab check ABIs  OR schedule is currently full and will have to rearrange to accommodate her case. Hopefully can do Tues or Wed this week. [name] 10/22/2020, 12:25 PM

## 2020-10-22 NOTE — Progress Notes (Signed)
ANTICOAGULATION CONSULT NOTE - Follow Up Consult  Pharmacy Consult for heparin Indication:  CAD awaiting CVTS consult  Labs: Recent Labs    10/08/2020 1333 10/13/2020 1607 10/22/20 0051 10/22/20 0954  HGB 13.0  --  12.2  --   HCT 39.5  --  37.4  --   PLT 264  --  246  --   HEPARINUNFRC  --   --  <0.10* 0.44  CREATININE 1.38*  --   --   --   TROPONINIHS 2,047* 2,098*  --   --    Assessment: 75yo female went to cath for NSTEMI prior to heparin being initiated; per cath notes CVTS will be consulted for possible CABG. On 9/16, sheath was removed ~5p, TR band removed ~9p.  Today, patient's heparin level is therapeutic (0.44) @ 900 units/hr. CBC stable. Per conversation with RN, no s/sx of bleeding or issues with infusion. CBC stable.   Goal of Therapy:  Heparin level 0.3-0.7 units/ml   Plan:  Continue heparin infusion at 900 units/hr. Recheck HL in 8 hours. Monitor CBC, HL, s/sx of bleeding daily.  Rushie Goltz, Pharm.D. PGY-1 Pharmacy Resident [phone] 10/22/2020 10:43 AM

## 2020-10-23 LAB — BASIC METABOLIC PANEL
Anion gap: 10 (ref 5–15)
BUN: 10 mg/dL (ref 8–23)
CO2: 22 mmol/L (ref 22–32)
Calcium: 8.4 mg/dL — ABNORMAL LOW (ref 8.9–10.3)
Chloride: 106 mmol/L (ref 98–111)
Creatinine, Ser: 1.33 mg/dL — ABNORMAL HIGH (ref 0.44–1.00)
GFR, Estimated: 41 mL/min — ABNORMAL LOW (ref >60)
Glucose, Bld: 97 mg/dL (ref 70–99)
Potassium: 3.3 mmol/L — ABNORMAL LOW (ref 3.5–5.1)
Sodium: 138 mmol/L (ref 135–145)

## 2020-10-23 LAB — CBC
HCT: 33.8 % — ABNORMAL LOW (ref 36.0–46.0)
Hemoglobin: 10.9 g/dL — ABNORMAL LOW (ref 12.0–15.0)
MCH: 27.8 pg (ref 26.0–34.0)
MCHC: 32.2 g/dL (ref 30.0–36.0)
MCV: 86.2 fL (ref 80.0–100.0)
Platelets: 234 10*3/uL (ref 150–400)
RBC: 3.92 MIL/uL (ref 3.87–5.11)
RDW: 14.6 % (ref 11.5–15.5)
WBC: 5.7 10*3/uL (ref 4.0–10.5)
nRBC: 0 % (ref 0.0–0.2)

## 2020-10-23 LAB — HEPARIN LEVEL (UNFRACTIONATED): Heparin Unfractionated: 0.39 IU/mL (ref 0.30–0.70)

## 2020-10-23 MED ORDER — SPIRONOLACTONE 12.5 MG HALF TABLET
12.5000 mg | ORAL_TABLET | Freq: Every day | ORAL | Status: DC
  Administered 2020-10-23 – 2020-10-24 (×2): 12.5 mg via ORAL
  Filled 2020-10-23 (×2): qty 1

## 2020-10-23 MED ORDER — POTASSIUM CHLORIDE CRYS ER 20 MEQ PO TBCR
40.0000 meq | EXTENDED_RELEASE_TABLET | Freq: Once | ORAL | Status: AC
  Administered 2020-10-23: 40 meq via ORAL
  Filled 2020-10-23: qty 2

## 2020-10-23 MED ORDER — POLYETHYLENE GLYCOL 3350 17 G PO PACK
17.0000 g | PACK | Freq: Every day | ORAL | Status: DC | PRN
  Administered 2020-10-23: 17 g via ORAL
  Filled 2020-10-23: qty 1

## 2020-10-23 MED ORDER — DAPAGLIFLOZIN PROPANEDIOL 10 MG PO TABS
10.0000 mg | ORAL_TABLET | Freq: Every day | ORAL | Status: DC
  Administered 2020-10-23 – 2020-10-24 (×2): 10 mg via ORAL
  Filled 2020-10-23 (×4): qty 1

## 2020-10-23 MED ORDER — IVABRADINE HCL 5 MG PO TABS
5.0000 mg | ORAL_TABLET | Freq: Two times a day (BID) | ORAL | Status: DC
  Administered 2020-10-23 – 2020-10-24 (×3): 5 mg via ORAL
  Filled 2020-10-23 (×4): qty 1

## 2020-10-23 NOTE — Progress Notes (Signed)
ANTICOAGULATION CONSULT NOTE - Follow Up Consult  Pharmacy Consult for heparin Indication:  CABG (likely 9/20 or 9/21, depending on OR availability)  Labs: Recent Labs    10/06/2020 1333 10/13/2020 1333 10/28/2020 1607 10/22/20 0051 10/22/20 0954 10/22/20 1759 10/23/20 0351  HGB 13.0  --   --  12.2  --   --  10.9*  HCT 39.5  --   --  37.4  --   --  33.8*  PLT 264  --   --  246  --   --  234  HEPARINUNFRC  --    < >  --  <0.10* 0.44 0.42 0.39  CREATININE 1.38*  --   --   --   --  1.34* 1.33*  TROPONINIHS 2,047*  --  2,098*  --   --   --   --    < > = values in this interval not displayed.   Assessment: 75yo female went to cath for NSTEMI prior to heparin being initiated; per cath notes CVTS will be consulted for possible CABG. On 9/16, sheath was removed ~5p, TR band removed ~9p.  Heparin level is therapeutic (0.39) @ 900 units/hr. H/H trending down but no s/sx of bleeding per conversation with RN. I also went to speak with the patient and she also reports no s/sx of bleeding. No blood seen around infusion site. Will continue to monitor CBC.   Goal of Therapy:  Heparin level 0.3-0.7 units/ml   Plan:  Continue heparin infusion at 900 units/hr. Recheck HL with morning labs Monitor CBC, HL, s/sx of bleeding daily.  Rushie Goltz, Pharm.D. PGY-1 Pharmacy Resident Phone:[phone] 10/23/2020 7:56 AM

## 2020-10-23 NOTE — Progress Notes (Signed)
Subjective:  No chest pain  Echocardiogram reviewed with the patient.   Objective:  Vital Signs in the last 24 hours: Temp:  [97.3 F (36.3 C)-98.1 F (36.7 C)] 98.1 F (36.7 C) (09/18 0919) Pulse Rate:  [67-78] 67 (09/18 0919) Resp:  [14-18] 14 (09/18 0919) BP: (102-122)/(62-71) 105/69 (09/18 0919) SpO2:  [96 %-100 %] 100 % (09/18 0919)  Intake/Output from previous day: 09/17 0701 - 09/18 0700 In: 468.3 [P.O.:320; I.V.:148.3] Out: 1300 [Urine:1300]  Physical Exam Vitals and nursing note reviewed.  Constitutional:      General: She is not in acute distress.    Appearance: She is well-developed.  HENT:     Head: Normocephalic and atraumatic.  Eyes:     Conjunctiva/sclera: Conjunctivae normal.     Pupils: Pupils are equal, round, and reactive to light.  Neck:     Vascular: No JVD.  Cardiovascular:     Rate and Rhythm: Normal rate and regular rhythm.     Pulses: Intact distal pulses.          Dorsalis pedis pulses are 0 on the right side and 0 on the left side.       Posterior tibial pulses are 0 on the right side and 0 on the left side.     Heart sounds: No murmur heard.    Comments: On peripheral pulses, including femoral pulses are difficult to appreciate. No critical limb ischemia on exam. Pulmonary:     Effort: Pulmonary effort is normal.     Breath sounds: Normal breath sounds. No wheezing or rales.  Abdominal:     General: Bowel sounds are normal.     Palpations: Abdomen is soft.     Tenderness: There is no rebound.  Musculoskeletal:        General: No tenderness. Normal range of motion.     Right lower leg: No edema.     Left lower leg: No edema.  Lymphadenopathy:     Cervical: No cervical adenopathy.  Skin:    General: Skin is warm and dry.  Neurological:     Mental Status: She is alert and oriented to person, place, and time.     Cranial Nerves: No cranial nerve deficit.     Lab Results: BMP Recent Labs    12/07/19 1316 10/26/2020 1333  10/22/20 1759 10/23/20 0351  NA 141 137 136 138  K 4.3 3.6 3.0* 3.3*  CL 100 103 106 106  CO2 26 23 23 22   GLUCOSE 109* 100* 126* 97  BUN 20 16 11 10   CREATININE 1.27* 1.38* 1.34* 1.33*  CALCIUM 9.6 9.3 8.3* 8.4*  GFRNONAA 41* 40* 41* 41*  GFRAA 48*  --   --   --      CBC Recent Labs  Lab 10/13/2020 1333 10/22/20 0051 10/23/20 0351  WBC 4.4   < > 5.7  RBC 4.60   < > 3.92  HGB 13.0   < > 10.9*  HCT 39.5   < > 33.8*  PLT 264   < > 234  MCV 85.9   < > 86.2  MCH 28.3   < > 27.8  MCHC 32.9   < > 32.2  RDW 14.5   < > 14.6  LYMPHSABS 2.4  --   --   MONOABS 0.3  --   --   EOSABS 0.1  --   --   BASOSABS 0.0  --   --    < > = values in this interval  not displayed.     HEMOGLOBIN A1C Lab Results  Component Value Date   HGBA1C 6.1 (H) 10/20/2020   MPG 128.37 10/30/2020    Cardiac Panel (last 3 results) Results for Veronica Swanson, Veronica Swanson (MRN [id number]) as of 10/22/2020 12:03  Ref. Range 11/03/2020 13:33 10/07/2020 16:07  Troponin I (High Sensitivity) Latest Ref Range: <18 ng/L 2,047 [facility]) 2,098 [facility])    Lipid Panel     Component Value Date/Time   CHOL 123 10/12/2020 1858   CHOL 139 12/07/2019 1316   TRIG 48 10/19/2020 1858   HDL 48 11/02/2020 1858   HDL 35 (L) 12/07/2019 1316   CHOLHDL 2.6 11/02/2020 1858   VLDL 10 10/16/2020 1858   LDLCALC 65 10/20/2020 1858   LDLCALC 78 12/07/2019 1316   LDLDIRECT 70 12/07/2019 1316     Hepatic Function Panel Recent Labs    12/07/19 1316 10/24/2020 1333  PROT 7.8 7.3  ALBUMIN 4.6 3.9  AST 18 24  ALT 11 14  ALKPHOS 41* 25*  BILITOT <0.2 0.5     Cardiac Studies:  EKG 10/27/2020: Sinus rhythm.   Anterior infarct, age indeterminate Mild ST elevation in lead V1, aVR does not meet STEMI criteria. Diffuse ischemic changes suspicious for left main or multivessel ischemia   Echocardiogram 2019: 1. Left ventricle cavity is normal in size. Mild concentric hypertrophy of the left ventricle. Normal global wall motion. Visual EF is  60-65%. Doppler evidence of grade I (impaired) diastolic dysfunction. 2. Mild (Grade I) mitral regurgitation. Mild prolapse of posterior mitral valve leaflet. 3. Trace tricuspid regurgitation     Assessment/Plan   75 y.o. African American female  with hypertension, hyperlipidemia, CKD 3, prediabetes, mod carotid artery disease, suspected PAD, former smoker, admitted with NSTEMI, severe multivessel disease, HFrEF   NSTEMI: Currently chest pain-free on low-dose nitroglycerin. Flat troponin around 2000. Severe multivessel disease including ostial LM Severe calcification of tortuous aorta.   Continue aspirin, statin, heparin, IV nitroglycerin. Continue metoprolol succinate 25 mg daily. Pendingcarotid duplex and lower extremity duplex.  Suspect she does have significant PAD. Discussed with Dr. Dorris Fetch.  Appreciate consult.    HFrEF: Severe LAD territory hypokinesis. LVEF 35-40%. BNP 1100 Continue IV lasix 40 mg daily. K replacement as needed Continue metoprolol succinate 25 mg daily. Added spironolactone 12.5 mg daily, Farxiga 10 mg daily, corlanor 5 mg bid. Will add [employer].  Hypertension: Controlled  CKD stage IIIb: GFR stable around 40  Carotid artery disease, PAD: Carotid duplex, LEA duplex pending     Elder Negus, MD Pager: [phone] Office: [phone]

## 2020-10-24 ENCOUNTER — Encounter (HOSPITAL_COMMUNITY): Payer: Self-pay | Admitting: Cardiology

## 2020-10-24 ENCOUNTER — Inpatient Hospital Stay (HOSPITAL_COMMUNITY): Payer: Medicare Other

## 2020-10-24 DIAGNOSIS — I25110 Atherosclerotic heart disease of native coronary artery with unstable angina pectoris: Secondary | ICD-10-CM | POA: Diagnosis not present

## 2020-10-24 DIAGNOSIS — Z01810 Encounter for preprocedural cardiovascular examination: Secondary | ICD-10-CM

## 2020-10-24 LAB — PULMONARY FUNCTION TEST
FEF 25-75 Pre: 1.47 L/sec
FEF2575-%Pred-Pre: 87 %
FEV1-%Pred-Pre: 81 %
FEV1-Pre: 1.58 L
FEV1FVC-%Pred-Pre: 103 %
FEV6-%Pred-Pre: 82 %
FEV6-Pre: 2 L
FEV6FVC-%Pred-Pre: 103 %
FVC-%Pred-Pre: 79 %
FVC-Pre: 2 L
Pre FEV1/FVC ratio: 79 %
Pre FEV6/FVC Ratio: 100 %

## 2020-10-24 LAB — URINALYSIS, ROUTINE W REFLEX MICROSCOPIC
Bilirubin Urine: NEGATIVE
Glucose, UA: >=500 mg/dL — AB
Hgb urine dipstick: NEGATIVE
Ketones, ur: NEGATIVE mg/dL
Leukocytes,Ua: NEGATIVE
Nitrite: NEGATIVE
Protein, ur: NEGATIVE mg/dL
Specific Gravity, Urine: <1.005 — ABNORMAL LOW (ref 1.005–1.030)
pH: 6 (ref 5.0–8.0)

## 2020-10-24 LAB — BLOOD GAS, ARTERIAL
Acid-base deficit: 4.4 mmol/L — ABNORMAL HIGH (ref 0.0–2.0)
Bicarbonate: 19.6 mmol/L — ABNORMAL LOW (ref 20.0–28.0)
Drawn by: 51133
FIO2: 21
O2 Saturation: 96.2 %
Patient temperature: 37
pCO2 arterial: 32.9 mmHg (ref 32.0–48.0)
pH, Arterial: 7.393 (ref 7.350–7.450)
pO2, Arterial: 82.8 mmHg — ABNORMAL LOW (ref 83.0–108.0)

## 2020-10-24 LAB — CBC
HCT: 32.4 % — ABNORMAL LOW (ref 36.0–46.0)
Hemoglobin: 10.8 g/dL — ABNORMAL LOW (ref 12.0–15.0)
MCH: 28.5 pg (ref 26.0–34.0)
MCHC: 33.3 g/dL (ref 30.0–36.0)
MCV: 85.5 fL (ref 80.0–100.0)
Platelets: 207 10*3/uL (ref 150–400)
RBC: 3.79 MIL/uL — ABNORMAL LOW (ref 3.87–5.11)
RDW: 14.8 % (ref 11.5–15.5)
WBC: 5.3 10*3/uL (ref 4.0–10.5)
nRBC: 0 % (ref 0.0–0.2)

## 2020-10-24 LAB — URINALYSIS, MICROSCOPIC (REFLEX)

## 2020-10-24 LAB — ABO/RH: ABO/RH(D): A POS

## 2020-10-24 LAB — BASIC METABOLIC PANEL
Anion gap: 9 (ref 5–15)
BUN: 14 mg/dL (ref 8–23)
CO2: 22 mmol/L (ref 22–32)
Calcium: 8.1 mg/dL — ABNORMAL LOW (ref 8.9–10.3)
Chloride: 107 mmol/L (ref 98–111)
Creatinine, Ser: 1.52 mg/dL — ABNORMAL HIGH (ref 0.44–1.00)
GFR, Estimated: 35 mL/min — ABNORMAL LOW (ref >60)
Glucose, Bld: 111 mg/dL — ABNORMAL HIGH (ref 70–99)
Potassium: 3.7 mmol/L (ref 3.5–5.1)
Sodium: 138 mmol/L (ref 135–145)

## 2020-10-24 LAB — SURGICAL PCR SCREEN
MRSA, PCR: NEGATIVE
Staphylococcus aureus: NEGATIVE

## 2020-10-24 LAB — HEPARIN LEVEL (UNFRACTIONATED): Heparin Unfractionated: 0.29 IU/mL — ABNORMAL LOW (ref 0.30–0.70)

## 2020-10-24 MED ORDER — NOREPINEPHRINE 4 MG/250ML-% IV SOLN
0.0000 ug/min | INTRAVENOUS | Status: AC
  Administered 2020-10-25: 4 ug/min via INTRAVENOUS
  Filled 2020-10-24: qty 250

## 2020-10-24 MED ORDER — CHLORHEXIDINE GLUCONATE CLOTH 2 % EX PADS
6.0000 | MEDICATED_PAD | Freq: Once | CUTANEOUS | Status: AC
  Administered 2020-10-24: 6 via TOPICAL

## 2020-10-24 MED ORDER — DIAZEPAM 2 MG PO TABS
2.0000 mg | ORAL_TABLET | Freq: Once | ORAL | Status: AC
  Administered 2020-10-25: 2 mg via ORAL
  Filled 2020-10-24: qty 1

## 2020-10-24 MED ORDER — EPINEPHRINE HCL 5 MG/250ML IV SOLN IN NS
0.0000 ug/min | INTRAVENOUS | Status: AC
  Administered 2020-10-25: 5 ug/min via INTRAVENOUS
  Filled 2020-10-24: qty 250

## 2020-10-24 MED ORDER — ASPIRIN 81 MG PO CHEW
CHEWABLE_TABLET | ORAL | Status: AC
  Filled 2020-10-24: qty 2

## 2020-10-24 MED ORDER — PHENYLEPHRINE HCL-NACL 20-0.9 MG/250ML-% IV SOLN
30.0000 ug/min | INTRAVENOUS | Status: AC
  Administered 2020-10-25: 25 ug/min via INTRAVENOUS
  Filled 2020-10-24: qty 250

## 2020-10-24 MED ORDER — BISACODYL 5 MG PO TBEC: 5.0000 mg | DELAYED_RELEASE_TABLET | Freq: Once | ORAL | Status: DC

## 2020-10-24 MED ORDER — VANCOMYCIN HCL 1250 MG/250ML IV SOLN
1250.0000 mg | INTRAVENOUS | Status: AC
  Administered 2020-10-25: 1250 mg via INTRAVENOUS
  Filled 2020-10-24: qty 250

## 2020-10-24 MED ORDER — NITROGLYCERIN IN D5W 200-5 MCG/ML-% IV SOLN
2.0000 ug/min | INTRAVENOUS | Status: DC
  Filled 2020-10-24: qty 250

## 2020-10-24 MED ORDER — METOPROLOL TARTRATE 12.5 MG HALF TABLET
12.5000 mg | ORAL_TABLET | Freq: Once | ORAL | Status: AC
  Administered 2020-10-25: 12.5 mg via ORAL
  Filled 2020-10-24: qty 1

## 2020-10-24 MED ORDER — TRANEXAMIC ACID 1000 MG/10ML IV SOLN
1.5000 mg/kg/h | INTRAVENOUS | Status: AC
  Administered 2020-10-25: 1.5 mg/kg/h via INTRAVENOUS
  Filled 2020-10-24: qty 25

## 2020-10-24 MED ORDER — CEFAZOLIN SODIUM-DEXTROSE 2-4 GM/100ML-% IV SOLN
2.0000 g | INTRAVENOUS | Status: AC
  Administered 2020-10-25: 2 g via INTRAVENOUS
  Filled 2020-10-24: qty 100

## 2020-10-24 MED ORDER — TRANEXAMIC ACID (OHS) PUMP PRIME SOLUTION
2.0000 mg/kg | INTRAVENOUS | Status: DC
  Filled 2020-10-24: qty 1.36

## 2020-10-24 MED ORDER — CHLORHEXIDINE GLUCONATE CLOTH 2 % EX PADS: 6.0000 | MEDICATED_PAD | Freq: Once | CUTANEOUS | Status: AC

## 2020-10-24 MED ORDER — PLASMA-LYTE A IV SOLN
INTRAVENOUS | Status: DC
  Filled 2020-10-24: qty 5

## 2020-10-24 MED ORDER — CEFAZOLIN SODIUM-DEXTROSE 2-4 GM/100ML-% IV SOLN
2.0000 g | INTRAVENOUS | Status: DC
  Administered 2020-10-25: 2 g via INTRAVENOUS
  Filled 2020-10-24: qty 100

## 2020-10-24 MED ORDER — CHLORHEXIDINE GLUCONATE 0.12 % MT SOLN
15.0000 mL | Freq: Once | OROMUCOSAL | Status: AC
  Administered 2020-10-25: 15 mL via OROMUCOSAL
  Filled 2020-10-24: qty 15

## 2020-10-24 MED ORDER — MILRINONE LACTATE IN DEXTROSE 20-5 MG/100ML-% IV SOLN
0.3000 ug/kg/min | INTRAVENOUS | Status: AC
  Administered 2020-10-25: .375 ug/kg/min via INTRAVENOUS
  Filled 2020-10-24: qty 100

## 2020-10-24 MED ORDER — TRANEXAMIC ACID (OHS) BOLUS VIA INFUSION
15.0000 mg/kg | INTRAVENOUS | Status: AC
  Administered 2020-10-25: 1020 mg via INTRAVENOUS
  Filled 2020-10-24: qty 1020

## 2020-10-24 MED ORDER — POTASSIUM CHLORIDE 2 MEQ/ML IV SOLN
80.0000 meq | INTRAVENOUS | Status: DC
  Filled 2020-10-24: qty 40

## 2020-10-24 MED ORDER — DEXMEDETOMIDINE HCL IN NACL 400 MCG/100ML IV SOLN
0.1000 ug/kg/h | INTRAVENOUS | Status: AC
  Administered 2020-10-25: .4 ug/kg/h via INTRAVENOUS
  Filled 2020-10-24: qty 100

## 2020-10-24 MED ORDER — INSULIN REGULAR(HUMAN) IN NACL 100-0.9 UT/100ML-% IV SOLN
INTRAVENOUS | Status: DC
  Filled 2020-10-24: qty 100

## 2020-10-24 MED ORDER — SODIUM CHLORIDE 0.9 % IV SOLN
INTRAVENOUS | Status: DC
  Filled 2020-10-24: qty 30

## 2020-10-24 MED ORDER — MAGNESIUM SULFATE 50 % IJ SOLN
40.0000 meq | INTRAMUSCULAR | Status: DC
  Filled 2020-10-24: qty 9.85

## 2020-10-24 NOTE — Progress Notes (Signed)
3 Days Post-Op Procedure(s) (LRB): LEFT HEART CATH AND CORONARY ANGIOGRAPHY (N/A) Subjective: Episode of pain this AM requiring increase of NTG infusion No CP at present  Objective: Vital signs in last 24 hours: Temp:  [97.5 F (36.4 C)-98.4 F (36.9 C)] 98.4 F (36.9 C) (09/19 1442) Pulse Rate:  [57-71] 57 (09/19 1442) Cardiac Rhythm: Normal sinus rhythm (09/19 1310) Resp:  [16-18] 16 (09/19 1442) BP: (101-130)/(62-89) 101/62 (09/19 1442) SpO2:  [97 %-98 %] 98 % (09/19 1442)  Hemodynamic parameters for last 24 hours:    Intake/Output from previous day: 09/18 0701 - 09/19 0700 In: 162 [I.V.:162] Out: 700 [Urine:700] Intake/Output this shift: Total I/O In: -  Out: 450 [Urine:450]  General appearance: alert, cooperative, and no distress Neurologic: intact Heart: regular rate and rhythm  Lab Results: Recent Labs    10/23/20 0351 10/24/20 0233  WBC 5.7 5.3  HGB 10.9* 10.8*  HCT 33.8* 32.4*  PLT 234 207   BMET:  Recent Labs    10/23/20 0351 10/24/20 0233  NA 138 138  K 3.3* 3.7  CL 106 107  CO2 22 22  GLUCOSE 97 111*  BUN 10 14  CREATININE 1.33* 1.52*  CALCIUM 8.4* 8.1*    PT/INR: No results for input(s): LABPROT, INR in the last 72 hours. ABG    Component Value Date/Time   HCO3 22.7 01/09/2007 0432   TCO2 24 01/09/2007 0432   ACIDBASEDEF 3.0 (H) 01/09/2007 0432   CBG (last 3)  No results for input(s): GLUCAP in the last 72 hours.  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

## 2020-10-24 NOTE — Progress Notes (Signed)
ANTICOAGULATION CONSULT NOTE - Follow Up Consult  Pharmacy Consult for heparin Indication:  CABG (likely 9/20 or 9/21, depending on OR availability)  Labs: Recent Labs    10/25/2020 1333 10/10/2020 1607 10/22/20 0051 10/22/20 0954 10/22/20 1759 10/23/20 0351 10/24/20 0233  HGB 13.0  --  12.2  --   --  10.9* 10.8*  HCT 39.5  --  37.4  --   --  33.8* 32.4*  PLT 264  --  246  --   --  234 207  HEPARINUNFRC  --   --  <0.10*   < > 0.42 0.39 0.29*  CREATININE 1.38*  --   --   --  1.34* 1.33* 1.52*  TROPONINIHS 2,047* 2,098*  --   --   --   --   --    < > = values in this interval not displayed.   Assessment: 75yo female with NSTEMI s/p cath and plans for CABG on 9/20. Pharmacy dosing heparin -heparin level = 0.29 on 900 units/hr -hg= 10.8  Goal of Therapy:  Heparin level 0.3-0.7 units/ml   Plan:  -Increase heparin to 1000 units/hr -Daily heparin level and CBC  Harland German, PharmD Clinical Pharmacist **Pharmacist phone directory can now be found on [email or web address] ([email or web address]).  Listed under [facility].

## 2020-10-24 NOTE — Progress Notes (Signed)
   10/24/20 1021  Clinical Encounter Type  Visited With Patient not available  Visit Type Initial  Referral From Nurse  Consult/Referral To Chaplain   Chaplain responded to Advance Directive consult. Medical team was talking with Pt when chaplain stopped by. Chaplain will follow-up this afternoon as able.  This note was prepared by Chaplain Resident, Tacy Learn, MDiv. Chaplain remains available as needed through the on-call pager: [phone].

## 2020-10-24 NOTE — Progress Notes (Signed)
CARDIAC REHAB PHASE I   Pt up in recliner eating breakfast. Uses RW at baseline. Discussed IS (1750 ml but needs verbal cues for correct use), sternal precautions, mobility post op, and d/c planning. Pt receptive. Will watch video and read book. Her family will be coming at d/c to stay with her. Will not ambulate due to CP last night. [phone]  [name] CES, ACSM 10/24/2020 10:37 AM

## 2020-10-24 NOTE — Progress Notes (Signed)
Patient had an episode of severe pain that started in back and radiated through chest and bilateral arms. She states this pain is not new for her and comes and goes. Increased Nitro and gave tramadol, pain resolved within 10 min. Vitals stable, remained with patient and monitored.

## 2020-10-24 NOTE — Anesthesia Preprocedure Evaluation (Addendum)
Anesthesia Evaluation  Patient identified by MRN, date of birth, ID band Patient awake    Reviewed: Allergy & Precautions, NPO status , Patient's Chart, lab work & pertinent test results  Airway Mallampati: III  TM Distance: >3 FB Neck ROM: Full    Dental  (+) Edentulous Upper, Edentulous Lower   Pulmonary Current Smoker and Patient abstained from smoking.,    Pulmonary exam normal breath sounds clear to auscultation       Cardiovascular hypertension, Pt. on medications + angina at rest + CAD, + Past MI, + Cardiac Stents and +CHF  Normal cardiovascular exam Rhythm:Regular Rate:Normal  ECHO: Left ventricular ejection fraction, by estimation, is 35 to 40%. The left ventricle has moderately decreased function. The left ventricle demonstrates regional wall motion abnormalities (see scoring diagram/findings for description). There is moderate left ventricular hypertrophy. Left ventricular diastolic parameters are consistent with Grade I diastolic dysfunction (impaired relaxation). There is severe hypokinesis of the left ventricular, entire anteroseptal wall, anterior wall and apical segment. Right ventricular systolic function is normal. The right ventricular size is normal. The mitral valve is grossly normal. Mild to moderate mitral valve regurgitation. The aortic valve is calcified. Aortic valve regurgitation is not visualized. Mild aortic valve sclerosis is present, with no evidence of aortic valve stenosis. The inferior vena cava is normal in size with <50% respiratory variability, suggesting right atrial pressure of 8 mmHg.  CATH: LM: Severe calcification       Ostial-mid 80% stenosis LAD: Tortuous take off     Tortuous vessel     Distal diffuse 70% disease     Rest of the vessel and diags are moderate caliber without any significant disease Lcx: Prox 70% disease with severe calcification, followed by 100%  occlusion prior to mid vessel stent    Retrograde filling from RCA/PDA    Large mid Lcx stent with 90% ISR RCA: Tortuous vessel     Distal PLA occlusion     Rest of the vessel without any significant disease  Normal LVEDP Severe tortuosity of right brachiocephalic artery and ascending aorta Severe calcification of aortic knuckle   Neuro/Psych negative neurological ROS  negative psych ROS   GI/Hepatic negative GI ROS, Neg liver ROS,   Endo/Other    Renal/GU Renal InsufficiencyRenal disease     Musculoskeletal negative musculoskeletal ROS (+)   Abdominal   Peds  Hematology  (+) anemia , HLD   Anesthesia Other Findings CAD  LMD  Reproductive/Obstetrics                          Anesthesia Physical Anesthesia Plan  ASA: 4  Anesthesia Plan: General   Post-op Pain Management:    Induction: Intravenous  PONV Risk Score and Plan: 2 and Ondansetron, Dexamethasone, Midazolam and Treatment may vary due to age or medical condition  Airway Management Planned: Oral ETT  Additional Equipment: Arterial line, CVP, PA Cath, TEE and Ultrasound Guidance Line Placement  Intra-op Plan:   Post-operative Plan: Post-operative intubation/ventilation  Informed Consent: I have reviewed the patients History and Physical, chart, labs and discussed the procedure including the risks, benefits and alternatives for the proposed anesthesia with the patient or authorized representative who has indicated his/her understanding and acceptance.       Plan Discussed with: CRNA  Anesthesia Plan Comments: (Nitroglycerin drip)      Anesthesia Quick Evaluation

## 2020-10-24 NOTE — Progress Notes (Signed)
Subjective:  No chest pain this morning  Objective:  Vital Signs in the last 24 hours: Temp:  [97.5 F (36.4 C)-98.1 F (36.7 C)] 97.8 F (36.6 C) (09/19 0528) Pulse Rate:  [63-79] 65 (09/19 0528) Resp:  [14-18] 18 (09/19 0528) BP: (105-130)/(64-89) 110/69 (09/19 0528) SpO2:  [97 %-100 %] 98 % (09/19 0528)  Intake/Output from previous day: 09/18 0701 - 09/19 0700 In: 162 [I.V.:162] Out: 700 [Urine:700]  Physical Exam Vitals and nursing note reviewed.  Constitutional:      General: She is not in acute distress.    Appearance: She is well-developed.  HENT:     Head: Normocephalic and atraumatic.  Eyes:     Conjunctiva/sclera: Conjunctivae normal.     Pupils: Pupils are equal, round, and reactive to light.  Neck:     Vascular: No JVD.  Cardiovascular:     Rate and Rhythm: Normal rate and regular rhythm.     Pulses: Intact distal pulses.          Dorsalis pedis pulses are 0 on the right side and 0 on the left side.       Posterior tibial pulses are 0 on the right side and 0 on the left side.     Heart sounds: No murmur heard.    Comments: On peripheral pulses, including femoral pulses are difficult to appreciate. No critical limb ischemia on exam. Pulmonary:     Effort: Pulmonary effort is normal.     Breath sounds: Normal breath sounds. No wheezing or rales.  Abdominal:     General: Bowel sounds are normal.     Palpations: Abdomen is soft.     Tenderness: There is no rebound.  Musculoskeletal:        General: No tenderness. Normal range of motion.     Right lower leg: No edema.     Left lower leg: No edema.  Lymphadenopathy:     Cervical: No cervical adenopathy.  Skin:    General: Skin is warm and dry.  Neurological:     Mental Status: She is alert and oriented to person, place, and time.     Cranial Nerves: No cranial nerve deficit.     Lab Results: BMP Recent Labs    12/07/19 1316 10/12/2020 1333 10/22/20 1759 10/23/20 0351 10/24/20 0233  NA 141   < >  136 138 138  K 4.3   < > 3.0* 3.3* 3.7  CL 100   < > 106 106 107  CO2 26   < > 23 22 22   GLUCOSE 109*   < > 126* 97 111*  BUN 20   < > 11 10 14   CREATININE 1.27*   < > 1.34* 1.33* 1.52*  CALCIUM 9.6   < > 8.3* 8.4* 8.1*  GFRNONAA 41*   < > 41* 41* 35*  GFRAA 48*  --   --   --   --    < > = values in this interval not displayed.     CBC Recent Labs  Lab 10/31/2020 1333 10/22/20 0051 10/24/20 0233  WBC 4.4   < > 5.3  RBC 4.60   < > 3.79*  HGB 13.0   < > 10.8*  HCT 39.5   < > 32.4*  PLT 264   < > 207  MCV 85.9   < > 85.5  MCH 28.3   < > 28.5  MCHC 32.9   < > 33.3  RDW 14.5   < >  14.8  LYMPHSABS 2.4  --   --   MONOABS 0.3  --   --   EOSABS 0.1  --   --   BASOSABS 0.0  --   --    < > = values in this interval not displayed.     HEMOGLOBIN A1C Lab Results  Component Value Date   HGBA1C 6.1 (H) 10/26/2020   MPG 128.37 11/02/2020    Cardiac Panel (last 3 results) Results for Veronica Swanson, Veronica Swanson (MRN [id number]) as of 10/22/2020 12:03  Ref. Range 10/18/2020 13:33 10/18/2020 16:07  Troponin I (High Sensitivity) Latest Ref Range: <18 ng/L 2,047 [facility]) 2,098 [facility])    Lipid Panel     Component Value Date/Time   CHOL 123 10/06/2020 1858   CHOL 139 12/07/2019 1316   TRIG 48 10/26/2020 1858   HDL 48 10/17/2020 1858   HDL 35 (L) 12/07/2019 1316   CHOLHDL 2.6 10/12/2020 1858   VLDL 10 10/26/2020 1858   LDLCALC 65 10/20/2020 1858   LDLCALC 78 12/07/2019 1316   LDLDIRECT 70 12/07/2019 1316     Hepatic Function Panel Recent Labs    12/07/19 1316 10/11/2020 1333  PROT 7.8 7.3  ALBUMIN 4.6 3.9  AST 18 24  ALT 11 14  ALKPHOS 41* 25*  BILITOT <0.2 0.5     Cardiac Studies:  EKG 10/16/2020: Sinus rhythm.   Anterior infarct, age indeterminate Mild ST elevation in lead V1, aVR does not meet STEMI criteria. Diffuse ischemic changes suspicious for left main or multivessel ischemia   Echocardiogram 2019: 1. Left ventricle cavity is normal in size. Mild concentric  hypertrophy of the left ventricle. Normal global wall motion. Visual EF is 60-65%. Doppler evidence of grade I (impaired) diastolic dysfunction. 2. Mild (Grade I) mitral regurgitation. Mild prolapse of posterior mitral valve leaflet. 3. Trace tricuspid regurgitation     Assessment/Plan   75 y.o. African American female  with hypertension, hyperlipidemia, CKD 3, prediabetes, mod carotid artery disease, suspected PAD, former smoker, admitted with NSTEMI, severe multivessel disease, HFrEF   NSTEMI: Currently chest pain-free on low-dose nitroglycerin. Flat troponin around 2000. Severe multivessel disease including ostial LM Severe calcification of tortuous aorta.   Continue aspirin, statin, heparin, IV nitroglycerin. Continue metoprolol succinate 25 mg daily. Pendingcarotid duplex and lower extremity duplex.  Suspect she does have significant PAD. Discussed with Dr. Dorris Fetch.  Appreciate consult.    HFrEF: Severe LAD territory hypokinesis. LVEF 35-40%. BNP 1100 Hold lasix today given relative euvolumic status and increase in Cr 1.3-->1.5. Continue metoprolol succinate 25 mg daily. Continue spironolactone 12.5 mg daily, Farxiga 10 mg daily, corlanor 5 mg bid. Given increase in Cr, will NOT add Entresto at this time.  Hypertension: Controlled  CKD stage IIIb: GFR stable around 40  Carotid artery disease, PAD: Carotid duplex, LEA duplex pending     Elder Negus, MD Pager: [phone] Office: [phone]

## 2020-10-24 NOTE — Care Management Important Message (Signed)
Important Message  Patient Details  Name: Veronica Swanson MRN: [id number] Date of Birth: Jan 12, 1946   Medicare Important Message Given:  Yes     Veronica Swanson Stefan Church 10/24/2020, 4:01 PM

## 2020-10-25 ENCOUNTER — Inpatient Hospital Stay (HOSPITAL_COMMUNITY): Payer: Medicare Other

## 2020-10-25 ENCOUNTER — Other Ambulatory Visit (HOSPITAL_COMMUNITY): Payer: Medicare Other

## 2020-10-25 ENCOUNTER — Encounter (HOSPITAL_COMMUNITY)
Admission: EM | Disposition: E | Payer: Self-pay | Source: Home / Self Care | Attending: Thoracic Surgery (Cardiothoracic Vascular Surgery)

## 2020-10-25 ENCOUNTER — Inpatient Hospital Stay (HOSPITAL_COMMUNITY)
Admission: EM | Disposition: E | Payer: Self-pay | Source: Home / Self Care | Attending: Thoracic Surgery (Cardiothoracic Vascular Surgery)

## 2020-10-25 ENCOUNTER — Inpatient Hospital Stay (HOSPITAL_COMMUNITY): Payer: Medicare Other | Admitting: Anesthesiology

## 2020-10-25 ENCOUNTER — Encounter (HOSPITAL_COMMUNITY): Payer: Self-pay | Admitting: Anesthesiology

## 2020-10-25 DIAGNOSIS — I469 Cardiac arrest, cause unspecified: Secondary | ICD-10-CM

## 2020-10-25 DIAGNOSIS — R570 Cardiogenic shock: Secondary | ICD-10-CM

## 2020-10-25 DIAGNOSIS — Z95811 Presence of heart assist device: Secondary | ICD-10-CM | POA: Diagnosis not present

## 2020-10-25 DIAGNOSIS — I25110 Atherosclerotic heart disease of native coronary artery with unstable angina pectoris: Secondary | ICD-10-CM

## 2020-10-25 HISTORY — PX: BEDSIDE EVACUATION OF HEMATOMA: SHX6535

## 2020-10-25 HISTORY — PX: CORONARY ARTERY BYPASS GRAFT: SHX141

## 2020-10-25 HISTORY — PX: ENDOVEIN HARVEST OF GREATER SAPHENOUS VEIN: SHX5059

## 2020-10-25 HISTORY — PX: PLACEMENT OF IMPELLA LEFT VENTRICULAR ASSIST DEVICE: SHX6519

## 2020-10-25 HISTORY — PX: CANNULATION FOR ECMO (EXTRACORPOREAL MEMBRANE OXYGENATION): SHX6796

## 2020-10-25 HISTORY — PX: HEMATOMA EVACUATION: SHX5118

## 2020-10-25 HISTORY — PX: TEE WITHOUT CARDIOVERSION: SHX5443

## 2020-10-25 LAB — COMPREHENSIVE METABOLIC PANEL
ALT: 22 U/L (ref 0–44)
AST: 97 U/L — ABNORMAL HIGH (ref 15–41)
Albumin: 2.6 g/dL — ABNORMAL LOW (ref 3.5–5.0)
Alkaline Phosphatase: 16 U/L — ABNORMAL LOW (ref 38–126)
Anion gap: 20 — ABNORMAL HIGH (ref 5–15)
BUN: 11 mg/dL (ref 8–23)
CO2: 20 mmol/L — ABNORMAL LOW (ref 22–32)
Calcium: 8.2 mg/dL — ABNORMAL LOW (ref 8.9–10.3)
Chloride: 106 mmol/L (ref 98–111)
Creatinine, Ser: 1.4 mg/dL — ABNORMAL HIGH (ref 0.44–1.00)
GFR, Estimated: 39 mL/min — ABNORMAL LOW (ref >60)
Glucose, Bld: 245 mg/dL — ABNORMAL HIGH (ref 70–99)
Potassium: 2.9 mmol/L — ABNORMAL LOW (ref 3.5–5.1)
Sodium: 146 mmol/L — ABNORMAL HIGH (ref 135–145)
Total Bilirubin: 1.3 mg/dL — ABNORMAL HIGH (ref 0.3–1.2)
Total Protein: 3.6 g/dL — ABNORMAL LOW (ref 6.5–8.1)

## 2020-10-25 LAB — POCT I-STAT, CHEM 8
BUN: 14 mg/dL (ref 8–23)
BUN: 11 mg/dL (ref 8–23)
BUN: 12 mg/dL (ref 8–23)
BUN: 11 mg/dL (ref 8–23)
BUN: 11 mg/dL (ref 8–23)
BUN: 13 mg/dL (ref 8–23)
BUN: 15 mg/dL (ref 8–23)
BUN: 12 mg/dL (ref 8–23)
BUN: 10 mg/dL (ref 8–23)
BUN: 9 mg/dL (ref 8–23)
BUN: 9 mg/dL (ref 8–23)
BUN: 9 mg/dL (ref 8–23)
Calcium, Ion: 1.15 mmol/L (ref 1.15–1.40)
Calcium, Ion: 1.22 mmol/L (ref 1.15–1.40)
Calcium, Ion: >2.5 mmol/L (ref 1.15–1.40)
Calcium, Ion: 0.84 mmol/L — CL (ref 1.15–1.40)
Calcium, Ion: 1.01 mmol/L — ABNORMAL LOW (ref 1.15–1.40)
Calcium, Ion: 1.01 mmol/L — ABNORMAL LOW (ref 1.15–1.40)
Calcium, Ion: 1.02 mmol/L — ABNORMAL LOW (ref 1.15–1.40)
Calcium, Ion: 0.87 mmol/L — CL (ref 1.15–1.40)
Calcium, Ion: 0.47 mmol/L — CL (ref 1.15–1.40)
Calcium, Ion: 0.58 mmol/L — CL (ref 1.15–1.40)
Calcium, Ion: 0.68 mmol/L — CL (ref 1.15–1.40)
Calcium, Ion: 0.81 mmol/L — CL (ref 1.15–1.40)
Chloride: 105 mmol/L (ref 98–111)
Chloride: 100 mmol/L (ref 98–111)
Chloride: 107 mmol/L (ref 98–111)
Chloride: 102 mmol/L (ref 98–111)
Chloride: 103 mmol/L (ref 98–111)
Chloride: 104 mmol/L (ref 98–111)
Chloride: 104 mmol/L (ref 98–111)
Chloride: 104 mmol/L (ref 98–111)
Chloride: 99 mmol/L (ref 98–111)
Chloride: 99 mmol/L (ref 98–111)
Chloride: 100 mmol/L (ref 98–111)
Chloride: 100 mmol/L (ref 98–111)
Creatinine, Ser: 1.4 mg/dL — ABNORMAL HIGH (ref 0.44–1.00)
Creatinine, Ser: 1 mg/dL (ref 0.44–1.00)
Creatinine, Ser: 1.1 mg/dL — ABNORMAL HIGH (ref 0.44–1.00)
Creatinine, Ser: 1 mg/dL (ref 0.44–1.00)
Creatinine, Ser: 1.1 mg/dL — ABNORMAL HIGH (ref 0.44–1.00)
Creatinine, Ser: 1.1 mg/dL — ABNORMAL HIGH (ref 0.44–1.00)
Creatinine, Ser: 1.2 mg/dL — ABNORMAL HIGH (ref 0.44–1.00)
Creatinine, Ser: 1 mg/dL (ref 0.44–1.00)
Creatinine, Ser: 0.7 mg/dL (ref 0.44–1.00)
Creatinine, Ser: 0.8 mg/dL (ref 0.44–1.00)
Creatinine, Ser: 0.8 mg/dL (ref 0.44–1.00)
Creatinine, Ser: 0.8 mg/dL (ref 0.44–1.00)
Glucose, Bld: 119 mg/dL — ABNORMAL HIGH (ref 70–99)
Glucose, Bld: 264 mg/dL — ABNORMAL HIGH (ref 70–99)
Glucose, Bld: 269 mg/dL — ABNORMAL HIGH (ref 70–99)
Glucose, Bld: 189 mg/dL — ABNORMAL HIGH (ref 70–99)
Glucose, Bld: 235 mg/dL — ABNORMAL HIGH (ref 70–99)
Glucose, Bld: 132 mg/dL — ABNORMAL HIGH (ref 70–99)
Glucose, Bld: 170 mg/dL — ABNORMAL HIGH (ref 70–99)
Glucose, Bld: 165 mg/dL — ABNORMAL HIGH (ref 70–99)
Glucose, Bld: 176 mg/dL — ABNORMAL HIGH (ref 70–99)
Glucose, Bld: 216 mg/dL — ABNORMAL HIGH (ref 70–99)
Glucose, Bld: 218 mg/dL — ABNORMAL HIGH (ref 70–99)
Glucose, Bld: 218 mg/dL — ABNORMAL HIGH (ref 70–99)
HCT: 29 % — ABNORMAL LOW (ref 36.0–46.0)
HCT: 20 % — ABNORMAL LOW (ref 36.0–46.0)
HCT: 28 % — ABNORMAL LOW (ref 36.0–46.0)
HCT: 22 % — ABNORMAL LOW (ref 36.0–46.0)
HCT: 24 % — ABNORMAL LOW (ref 36.0–46.0)
HCT: 21 % — ABNORMAL LOW (ref 36.0–46.0)
HCT: 25 % — ABNORMAL LOW (ref 36.0–46.0)
HCT: 26 % — ABNORMAL LOW (ref 36.0–46.0)
HCT: 23 % — ABNORMAL LOW (ref 36.0–46.0)
HCT: 27 % — ABNORMAL LOW (ref 36.0–46.0)
HCT: 27 % — ABNORMAL LOW (ref 36.0–46.0)
HCT: 29 % — ABNORMAL LOW (ref 36.0–46.0)
Hemoglobin: 9.9 g/dL — ABNORMAL LOW (ref 12.0–15.0)
Hemoglobin: 6.8 g/dL — CL (ref 12.0–15.0)
Hemoglobin: 9.5 g/dL — ABNORMAL LOW (ref 12.0–15.0)
Hemoglobin: 7.5 g/dL — ABNORMAL LOW (ref 12.0–15.0)
Hemoglobin: 8.2 g/dL — ABNORMAL LOW (ref 12.0–15.0)
Hemoglobin: 7.1 g/dL — ABNORMAL LOW (ref 12.0–15.0)
Hemoglobin: 8.5 g/dL — ABNORMAL LOW (ref 12.0–15.0)
Hemoglobin: 8.8 g/dL — ABNORMAL LOW (ref 12.0–15.0)
Hemoglobin: 7.8 g/dL — ABNORMAL LOW (ref 12.0–15.0)
Hemoglobin: 9.2 g/dL — ABNORMAL LOW (ref 12.0–15.0)
Hemoglobin: 9.2 g/dL — ABNORMAL LOW (ref 12.0–15.0)
Hemoglobin: 9.9 g/dL — ABNORMAL LOW (ref 12.0–15.0)
Potassium: 3.6 mmol/L (ref 3.5–5.1)
Potassium: 3.1 mmol/L — ABNORMAL LOW (ref 3.5–5.1)
Potassium: 3.9 mmol/L (ref 3.5–5.1)
Potassium: 4.7 mmol/L (ref 3.5–5.1)
Potassium: 5.1 mmol/L (ref 3.5–5.1)
Potassium: 4.8 mmol/L (ref 3.5–5.1)
Potassium: 3.9 mmol/L (ref 3.5–5.1)
Potassium: 3.2 mmol/L — ABNORMAL LOW (ref 3.5–5.1)
Potassium: 3.6 mmol/L (ref 3.5–5.1)
Potassium: 3.8 mmol/L (ref 3.5–5.1)
Potassium: 3.2 mmol/L — ABNORMAL LOW (ref 3.5–5.1)
Potassium: 3.3 mmol/L — ABNORMAL LOW (ref 3.5–5.1)
Sodium: 140 mmol/L (ref 135–145)
Sodium: 140 mmol/L (ref 135–145)
Sodium: 146 mmol/L — ABNORMAL HIGH (ref 135–145)
Sodium: 139 mmol/L (ref 135–145)
Sodium: 140 mmol/L (ref 135–145)
Sodium: 143 mmol/L (ref 135–145)
Sodium: 144 mmol/L (ref 135–145)
Sodium: 144 mmol/L (ref 135–145)
Sodium: 142 mmol/L (ref 135–145)
Sodium: 144 mmol/L (ref 135–145)
Sodium: 144 mmol/L (ref 135–145)
Sodium: 145 mmol/L (ref 135–145)
TCO2: 21 mmol/L — ABNORMAL LOW (ref 22–32)
TCO2: 25 mmol/L (ref 22–32)
TCO2: 31 mmol/L (ref 22–32)
TCO2: 22 mmol/L (ref 22–32)
TCO2: 23 mmol/L (ref 22–32)
TCO2: 26 mmol/L (ref 22–32)
TCO2: 24 mmol/L (ref 22–32)
TCO2: 23 mmol/L (ref 22–32)
TCO2: 22 mmol/L (ref 22–32)
TCO2: 22 mmol/L (ref 22–32)
TCO2: 22 mmol/L (ref 22–32)
TCO2: 22 mmol/L (ref 22–32)

## 2020-10-25 LAB — POCT I-STAT 7, (LYTES, BLD GAS, ICA,H+H)
Acid-Base Excess: 12 mmol/L — ABNORMAL HIGH (ref 0.0–2.0)
Acid-Base Excess: 1 mmol/L (ref 0.0–2.0)
Acid-Base Excess: 10 mmol/L — ABNORMAL HIGH (ref 0.0–2.0)
Acid-base deficit: 10 mmol/L — ABNORMAL HIGH (ref 0.0–2.0)
Acid-base deficit: 2 mmol/L (ref 0.0–2.0)
Acid-base deficit: 2 mmol/L (ref 0.0–2.0)
Acid-base deficit: 2 mmol/L (ref 0.0–2.0)
Acid-base deficit: 2 mmol/L (ref 0.0–2.0)
Acid-base deficit: 2 mmol/L (ref 0.0–2.0)
Acid-base deficit: 3 mmol/L — ABNORMAL HIGH (ref 0.0–2.0)
Acid-base deficit: 3 mmol/L — ABNORMAL HIGH (ref 0.0–2.0)
Acid-base deficit: 3 mmol/L — ABNORMAL HIGH (ref 0.0–2.0)
Acid-base deficit: 4 mmol/L — ABNORMAL HIGH (ref 0.0–2.0)
Acid-base deficit: 4 mmol/L — ABNORMAL HIGH (ref 0.0–2.0)
Acid-base deficit: 4 mmol/L — ABNORMAL HIGH (ref 0.0–2.0)
Acid-base deficit: 5 mmol/L — ABNORMAL HIGH (ref 0.0–2.0)
Acid-base deficit: 5 mmol/L — ABNORMAL HIGH (ref 0.0–2.0)
Bicarbonate: 15.6 mmol/L — ABNORMAL LOW (ref 20.0–28.0)
Bicarbonate: 20 mmol/L (ref 20.0–28.0)
Bicarbonate: 20.1 mmol/L (ref 20.0–28.0)
Bicarbonate: 20.7 mmol/L (ref 20.0–28.0)
Bicarbonate: 20.8 mmol/L (ref 20.0–28.0)
Bicarbonate: 21.7 mmol/L (ref 20.0–28.0)
Bicarbonate: 21.9 mmol/L (ref 20.0–28.0)
Bicarbonate: 22.2 mmol/L (ref 20.0–28.0)
Bicarbonate: 22.8 mmol/L (ref 20.0–28.0)
Bicarbonate: 22.9 mmol/L (ref 20.0–28.0)
Bicarbonate: 22.9 mmol/L (ref 20.0–28.0)
Bicarbonate: 23.3 mmol/L (ref 20.0–28.0)
Bicarbonate: 23.5 mmol/L (ref 20.0–28.0)
Bicarbonate: 24 mmol/L (ref 20.0–28.0)
Bicarbonate: 24.1 mmol/L (ref 20.0–28.0)
Bicarbonate: 33.4 mmol/L — ABNORMAL HIGH (ref 20.0–28.0)
Bicarbonate: 36.7 mmol/L — ABNORMAL HIGH (ref 20.0–28.0)
Calcium, Ion: 0.47 mmol/L — CL (ref 1.15–1.40)
Calcium, Ion: 0.57 mmol/L — CL (ref 1.15–1.40)
Calcium, Ion: 0.75 mmol/L — CL (ref 1.15–1.40)
Calcium, Ion: 0.81 mmol/L — CL (ref 1.15–1.40)
Calcium, Ion: 0.83 mmol/L — CL (ref 1.15–1.40)
Calcium, Ion: 0.89 mmol/L — CL (ref 1.15–1.40)
Calcium, Ion: 0.94 mmol/L — ABNORMAL LOW (ref 1.15–1.40)
Calcium, Ion: 0.95 mmol/L — ABNORMAL LOW (ref 1.15–1.40)
Calcium, Ion: 0.98 mmol/L — ABNORMAL LOW (ref 1.15–1.40)
Calcium, Ion: 0.99 mmol/L — ABNORMAL LOW (ref 1.15–1.40)
Calcium, Ion: 1 mmol/L — ABNORMAL LOW (ref 1.15–1.40)
Calcium, Ion: 1.01 mmol/L — ABNORMAL LOW (ref 1.15–1.40)
Calcium, Ion: 1.05 mmol/L — ABNORMAL LOW (ref 1.15–1.40)
Calcium, Ion: 1.13 mmol/L — ABNORMAL LOW (ref 1.15–1.40)
Calcium, Ion: 1.24 mmol/L (ref 1.15–1.40)
Calcium, Ion: >2.5 mmol/L (ref 1.15–1.40)
Calcium, Ion: >2.5 mmol/L (ref 1.15–1.40)
HCT: 17 % — ABNORMAL LOW (ref 36.0–46.0)
HCT: 20 % — ABNORMAL LOW (ref 36.0–46.0)
HCT: 20 % — ABNORMAL LOW (ref 36.0–46.0)
HCT: 21 % — ABNORMAL LOW (ref 36.0–46.0)
HCT: 23 % — ABNORMAL LOW (ref 36.0–46.0)
HCT: 24 % — ABNORMAL LOW (ref 36.0–46.0)
HCT: 24 % — ABNORMAL LOW (ref 36.0–46.0)
HCT: 25 % — ABNORMAL LOW (ref 36.0–46.0)
HCT: 26 % — ABNORMAL LOW (ref 36.0–46.0)
HCT: 27 % — ABNORMAL LOW (ref 36.0–46.0)
HCT: 27 % — ABNORMAL LOW (ref 36.0–46.0)
HCT: 27 % — ABNORMAL LOW (ref 36.0–46.0)
HCT: 27 % — ABNORMAL LOW (ref 36.0–46.0)
HCT: 28 % — ABNORMAL LOW (ref 36.0–46.0)
HCT: 29 % — ABNORMAL LOW (ref 36.0–46.0)
HCT: 30 % — ABNORMAL LOW (ref 36.0–46.0)
HCT: 31 % — ABNORMAL LOW (ref 36.0–46.0)
Hemoglobin: 10.2 g/dL — ABNORMAL LOW (ref 12.0–15.0)
Hemoglobin: 10.5 g/dL — ABNORMAL LOW (ref 12.0–15.0)
Hemoglobin: 5.8 g/dL — CL (ref 12.0–15.0)
Hemoglobin: 6.8 g/dL — CL (ref 12.0–15.0)
Hemoglobin: 6.8 g/dL — CL (ref 12.0–15.0)
Hemoglobin: 7.1 g/dL — ABNORMAL LOW (ref 12.0–15.0)
Hemoglobin: 7.8 g/dL — ABNORMAL LOW (ref 12.0–15.0)
Hemoglobin: 8.2 g/dL — ABNORMAL LOW (ref 12.0–15.0)
Hemoglobin: 8.2 g/dL — ABNORMAL LOW (ref 12.0–15.0)
Hemoglobin: 8.5 g/dL — ABNORMAL LOW (ref 12.0–15.0)
Hemoglobin: 8.8 g/dL — ABNORMAL LOW (ref 12.0–15.0)
Hemoglobin: 9.2 g/dL — ABNORMAL LOW (ref 12.0–15.0)
Hemoglobin: 9.2 g/dL — ABNORMAL LOW (ref 12.0–15.0)
Hemoglobin: 9.2 g/dL — ABNORMAL LOW (ref 12.0–15.0)
Hemoglobin: 9.2 g/dL — ABNORMAL LOW (ref 12.0–15.0)
Hemoglobin: 9.5 g/dL — ABNORMAL LOW (ref 12.0–15.0)
Hemoglobin: 9.9 g/dL — ABNORMAL LOW (ref 12.0–15.0)
O2 Saturation: 100 %
O2 Saturation: 100 %
O2 Saturation: 100 %
O2 Saturation: 100 %
O2 Saturation: 100 %
O2 Saturation: 100 %
O2 Saturation: 100 %
O2 Saturation: 100 %
O2 Saturation: 100 %
O2 Saturation: 100 %
O2 Saturation: 100 %
O2 Saturation: 100 %
O2 Saturation: 75 %
O2 Saturation: 78 %
O2 Saturation: 85 %
O2 Saturation: 88 %
O2 Saturation: 99 %
Patient temperature: 35.8
Patient temperature: 35.9
Patient temperature: 35.9
Patient temperature: 36.2
Potassium: 2.7 mmol/L — CL (ref 3.5–5.1)
Potassium: 2.8 mmol/L — ABNORMAL LOW (ref 3.5–5.1)
Potassium: 3.2 mmol/L — ABNORMAL LOW (ref 3.5–5.1)
Potassium: 3.3 mmol/L — ABNORMAL LOW (ref 3.5–5.1)
Potassium: 3.3 mmol/L — ABNORMAL LOW (ref 3.5–5.1)
Potassium: 3.3 mmol/L — ABNORMAL LOW (ref 3.5–5.1)
Potassium: 3.5 mmol/L (ref 3.5–5.1)
Potassium: 3.6 mmol/L (ref 3.5–5.1)
Potassium: 3.7 mmol/L (ref 3.5–5.1)
Potassium: 3.9 mmol/L (ref 3.5–5.1)
Potassium: 4 mmol/L (ref 3.5–5.1)
Potassium: 4 mmol/L (ref 3.5–5.1)
Potassium: 4.1 mmol/L (ref 3.5–5.1)
Potassium: 4.6 mmol/L (ref 3.5–5.1)
Potassium: 4.6 mmol/L (ref 3.5–5.1)
Potassium: 4.7 mmol/L (ref 3.5–5.1)
Potassium: 5.1 mmol/L (ref 3.5–5.1)
Sodium: 137 mmol/L (ref 135–145)
Sodium: 140 mmol/L (ref 135–145)
Sodium: 141 mmol/L (ref 135–145)
Sodium: 142 mmol/L (ref 135–145)
Sodium: 144 mmol/L (ref 135–145)
Sodium: 144 mmol/L (ref 135–145)
Sodium: 144 mmol/L (ref 135–145)
Sodium: 144 mmol/L (ref 135–145)
Sodium: 144 mmol/L (ref 135–145)
Sodium: 144 mmol/L (ref 135–145)
Sodium: 145 mmol/L (ref 135–145)
Sodium: 145 mmol/L (ref 135–145)
Sodium: 145 mmol/L (ref 135–145)
Sodium: 145 mmol/L (ref 135–145)
Sodium: 146 mmol/L — ABNORMAL HIGH (ref 135–145)
Sodium: 146 mmol/L — ABNORMAL HIGH (ref 135–145)
Sodium: 147 mmol/L — ABNORMAL HIGH (ref 135–145)
TCO2: 17 mmol/L — ABNORMAL LOW (ref 22–32)
TCO2: 21 mmol/L — ABNORMAL LOW (ref 22–32)
TCO2: 21 mmol/L — ABNORMAL LOW (ref 22–32)
TCO2: 22 mmol/L (ref 22–32)
TCO2: 22 mmol/L (ref 22–32)
TCO2: 23 mmol/L (ref 22–32)
TCO2: 23 mmol/L (ref 22–32)
TCO2: 23 mmol/L (ref 22–32)
TCO2: 24 mmol/L (ref 22–32)
TCO2: 24 mmol/L (ref 22–32)
TCO2: 24 mmol/L (ref 22–32)
TCO2: 25 mmol/L (ref 22–32)
TCO2: 25 mmol/L (ref 22–32)
TCO2: 25 mmol/L (ref 22–32)
TCO2: 26 mmol/L (ref 22–32)
TCO2: 35 mmol/L — ABNORMAL HIGH (ref 22–32)
TCO2: 38 mmol/L — ABNORMAL HIGH (ref 22–32)
pCO2 arterial: 28.8 mmHg — ABNORMAL LOW (ref 32.0–48.0)
pCO2 arterial: 30.9 mmHg — ABNORMAL LOW (ref 32.0–48.0)
pCO2 arterial: 31.3 mmHg — ABNORMAL LOW (ref 32.0–48.0)
pCO2 arterial: 31.3 mmHg — ABNORMAL LOW (ref 32.0–48.0)
pCO2 arterial: 31.6 mmHg — ABNORMAL LOW (ref 32.0–48.0)
pCO2 arterial: 37 mmHg (ref 32.0–48.0)
pCO2 arterial: 37.7 mmHg (ref 32.0–48.0)
pCO2 arterial: 37.8 mmHg (ref 32.0–48.0)
pCO2 arterial: 38.8 mmHg (ref 32.0–48.0)
pCO2 arterial: 39 mmHg (ref 32.0–48.0)
pCO2 arterial: 42 mmHg (ref 32.0–48.0)
pCO2 arterial: 43.1 mmHg (ref 32.0–48.0)
pCO2 arterial: 43.9 mmHg (ref 32.0–48.0)
pCO2 arterial: 44.2 mmHg (ref 32.0–48.0)
pCO2 arterial: 46.4 mmHg (ref 32.0–48.0)
pCO2 arterial: 46.5 mmHg (ref 32.0–48.0)
pCO2 arterial: 47.7 mmHg (ref 32.0–48.0)
pH, Arterial: 7.289 — ABNORMAL LOW (ref 7.350–7.450)
pH, Arterial: 7.302 — ABNORMAL LOW (ref 7.350–7.450)
pH, Arterial: 7.32 — ABNORMAL LOW (ref 7.350–7.450)
pH, Arterial: 7.324 — ABNORMAL LOW (ref 7.350–7.450)
pH, Arterial: 7.325 — ABNORMAL LOW (ref 7.350–7.450)
pH, Arterial: 7.333 — ABNORMAL LOW (ref 7.350–7.450)
pH, Arterial: 7.341 — ABNORMAL LOW (ref 7.350–7.450)
pH, Arterial: 7.342 — ABNORMAL LOW (ref 7.350–7.450)
pH, Arterial: 7.342 — ABNORMAL LOW (ref 7.350–7.450)
pH, Arterial: 7.372 (ref 7.350–7.450)
pH, Arterial: 7.377 (ref 7.350–7.450)
pH, Arterial: 7.414 (ref 7.350–7.450)
pH, Arterial: 7.429 (ref 7.350–7.450)
pH, Arterial: 7.456 — ABNORMAL HIGH (ref 7.350–7.450)
pH, Arterial: 7.503 — ABNORMAL HIGH (ref 7.350–7.450)
pH, Arterial: 7.528 — ABNORMAL HIGH (ref 7.350–7.450)
pH, Arterial: 7.541 — ABNORMAL HIGH (ref 7.350–7.450)
pO2, Arterial: 137 mmHg — ABNORMAL HIGH (ref 83.0–108.0)
pO2, Arterial: 327 mmHg — ABNORMAL HIGH (ref 83.0–108.0)
pO2, Arterial: 348 mmHg — ABNORMAL HIGH (ref 83.0–108.0)
pO2, Arterial: 391 mmHg — ABNORMAL HIGH (ref 83.0–108.0)
pO2, Arterial: 391 mmHg — ABNORMAL HIGH (ref 83.0–108.0)
pO2, Arterial: 395 mmHg — ABNORMAL HIGH (ref 83.0–108.0)
pO2, Arterial: 405 mmHg — ABNORMAL HIGH (ref 83.0–108.0)
pO2, Arterial: 427 mmHg — ABNORMAL HIGH (ref 83.0–108.0)
pO2, Arterial: 43 mmHg — ABNORMAL LOW (ref 83.0–108.0)
pO2, Arterial: 432 mmHg — ABNORMAL HIGH (ref 83.0–108.0)
pO2, Arterial: 433 mmHg — ABNORMAL HIGH (ref 83.0–108.0)
pO2, Arterial: 439 mmHg — ABNORMAL HIGH (ref 83.0–108.0)
pO2, Arterial: 452 mmHg — ABNORMAL HIGH (ref 83.0–108.0)
pO2, Arterial: 48 mmHg — ABNORMAL LOW (ref 83.0–108.0)
pO2, Arterial: 49 mmHg — ABNORMAL LOW (ref 83.0–108.0)
pO2, Arterial: 503 mmHg — ABNORMAL HIGH (ref 83.0–108.0)
pO2, Arterial: 54 mmHg — ABNORMAL LOW (ref 83.0–108.0)

## 2020-10-25 LAB — BASIC METABOLIC PANEL
Anion gap: 10 (ref 5–15)
Anion gap: 19 — ABNORMAL HIGH (ref 5–15)
BUN: 15 mg/dL (ref 8–23)
BUN: 11 mg/dL (ref 8–23)
CO2: 21 mmol/L — ABNORMAL LOW (ref 22–32)
CO2: 20 mmol/L — ABNORMAL LOW (ref 22–32)
Calcium: 8.1 mg/dL — ABNORMAL LOW (ref 8.9–10.3)
Calcium: 7.5 mg/dL — ABNORMAL LOW (ref 8.9–10.3)
Chloride: 105 mmol/L (ref 98–111)
Chloride: 106 mmol/L (ref 98–111)
Creatinine, Ser: 1.66 mg/dL — ABNORMAL HIGH (ref 0.44–1.00)
Creatinine, Ser: 1.44 mg/dL — ABNORMAL HIGH (ref 0.44–1.00)
GFR, Estimated: 32 mL/min — ABNORMAL LOW (ref >60)
GFR, Estimated: 38 mL/min — ABNORMAL LOW (ref >60)
Glucose, Bld: 100 mg/dL — ABNORMAL HIGH (ref 70–99)
Glucose, Bld: 242 mg/dL — ABNORMAL HIGH (ref 70–99)
Potassium: 3.7 mmol/L (ref 3.5–5.1)
Potassium: 3.3 mmol/L — ABNORMAL LOW (ref 3.5–5.1)
Sodium: 136 mmol/L (ref 135–145)
Sodium: 145 mmol/L (ref 135–145)

## 2020-10-25 LAB — PROTIME-INR
INR: 1.2 (ref 0.8–1.2)
INR: 1.8 — ABNORMAL HIGH (ref 0.8–1.2)
INR: 1.9 — ABNORMAL HIGH (ref 0.8–1.2)
INR: 1.5 — ABNORMAL HIGH (ref 0.8–1.2)
Prothrombin Time: 15.4 seconds — ABNORMAL HIGH (ref 11.4–15.2)
Prothrombin Time: 20.6 seconds — ABNORMAL HIGH (ref 11.4–15.2)
Prothrombin Time: 21.8 seconds — ABNORMAL HIGH (ref 11.4–15.2)
Prothrombin Time: 18.3 seconds — ABNORMAL HIGH (ref 11.4–15.2)

## 2020-10-25 LAB — CBC
HCT: 31.8 % — ABNORMAL LOW (ref 36.0–46.0)
HCT: 25.2 % — ABNORMAL LOW (ref 36.0–46.0)
HCT: 28.2 % — ABNORMAL LOW (ref 36.0–46.0)
Hemoglobin: 10.4 g/dL — ABNORMAL LOW (ref 12.0–15.0)
Hemoglobin: 8.6 g/dL — ABNORMAL LOW (ref 12.0–15.0)
Hemoglobin: 9.5 g/dL — ABNORMAL LOW (ref 12.0–15.0)
MCH: 28 pg (ref 26.0–34.0)
MCH: 29.8 pg (ref 26.0–34.0)
MCH: 29.9 pg (ref 26.0–34.0)
MCHC: 32.7 g/dL (ref 30.0–36.0)
MCHC: 34.1 g/dL (ref 30.0–36.0)
MCHC: 33.7 g/dL (ref 30.0–36.0)
MCV: 85.7 fL (ref 80.0–100.0)
MCV: 87.2 fL (ref 80.0–100.0)
MCV: 88.7 fL (ref 80.0–100.0)
Platelets: 207 10*3/uL (ref 150–400)
Platelets: 81 10*3/uL — ABNORMAL LOW (ref 150–400)
Platelets: 49 10*3/uL — ABNORMAL LOW (ref 150–400)
RBC: 3.71 MIL/uL — ABNORMAL LOW (ref 3.87–5.11)
RBC: 2.89 MIL/uL — ABNORMAL LOW (ref 3.87–5.11)
RBC: 3.18 MIL/uL — ABNORMAL LOW (ref 3.87–5.11)
RDW: 14.9 % (ref 11.5–15.5)
RDW: 14.9 % (ref 11.5–15.5)
RDW: 14.8 % (ref 11.5–15.5)
WBC: 4.6 10*3/uL (ref 4.0–10.5)
WBC: 5.7 10*3/uL (ref 4.0–10.5)
WBC: 3.9 10*3/uL — ABNORMAL LOW (ref 4.0–10.5)
nRBC: 0 % (ref 0.0–0.2)
nRBC: 0 % (ref 0.0–0.2)
nRBC: 0 % (ref 0.0–0.2)

## 2020-10-25 LAB — POCT I-STAT EG7
Acid-base deficit: 3 mmol/L — ABNORMAL HIGH (ref 0.0–2.0)
Bicarbonate: 20.6 mmol/L (ref 20.0–28.0)
Calcium, Ion: 1.28 mmol/L (ref 1.15–1.40)
HCT: 17 % — ABNORMAL LOW (ref 36.0–46.0)
Hemoglobin: 5.8 g/dL — CL (ref 12.0–15.0)
O2 Saturation: 86 %
Potassium: 3.2 mmol/L — ABNORMAL LOW (ref 3.5–5.1)
Sodium: 139 mmol/L (ref 135–145)
TCO2: 22 mmol/L (ref 22–32)
pCO2, Ven: 29 mmHg — ABNORMAL LOW (ref 44.0–60.0)
pH, Ven: 7.461 — ABNORMAL HIGH (ref 7.250–7.430)
pO2, Ven: 48 mmHg — ABNORMAL HIGH (ref 32.0–45.0)

## 2020-10-25 LAB — FIBRINOGEN
Fibrinogen: 153 mg/dL — ABNORMAL LOW (ref 210–475)
Fibrinogen: 168 mg/dL — ABNORMAL LOW (ref 210–475)

## 2020-10-25 LAB — HEMOGLOBIN AND HEMATOCRIT, BLOOD
HCT: 22.7 % — ABNORMAL LOW (ref 36.0–46.0)
Hemoglobin: 7.7 g/dL — ABNORMAL LOW (ref 12.0–15.0)

## 2020-10-25 LAB — LACTIC ACID, PLASMA: Lactic Acid, Venous: 10.4 mmol/L (ref 0.5–1.9)

## 2020-10-25 LAB — APTT
aPTT: 86 seconds — ABNORMAL HIGH (ref 24–36)
aPTT: 53 seconds — ABNORMAL HIGH (ref 24–36)
aPTT: 76 seconds — ABNORMAL HIGH (ref 24–36)

## 2020-10-25 LAB — GLUCOSE, CAPILLARY
Glucose-Capillary: 222 mg/dL — ABNORMAL HIGH (ref 70–99)
Glucose-Capillary: 235 mg/dL — ABNORMAL HIGH (ref 70–99)
Glucose-Capillary: 226 mg/dL — ABNORMAL HIGH (ref 70–99)
Glucose-Capillary: 197 mg/dL — ABNORMAL HIGH (ref 70–99)
Glucose-Capillary: 209 mg/dL — ABNORMAL HIGH (ref 70–99)

## 2020-10-25 LAB — ECHO INTRAOPERATIVE TEE
Height: 67 in
Weight: 2400 oz

## 2020-10-25 LAB — PREPARE RBC (CROSSMATCH)

## 2020-10-25 LAB — ECHOCARDIOGRAM LIMITED
Height: 67 in
Weight: 2400 oz

## 2020-10-25 LAB — LACTATE DEHYDROGENASE: LDH: 203 U/L — ABNORMAL HIGH (ref 98–192)

## 2020-10-25 LAB — HEPARIN LEVEL (UNFRACTIONATED)
Heparin Unfractionated: 0.44 IU/mL (ref 0.30–0.70)
Heparin Unfractionated: <0.1 IU/mL — ABNORMAL LOW (ref 0.30–0.70)

## 2020-10-25 LAB — PLATELET COUNT: Platelets: 86 10*3/uL — ABNORMAL LOW (ref 150–400)

## 2020-10-25 SURGERY — EVACUATION HEMATOMA
Anesthesia: Choice

## 2020-10-25 SURGERY — CORONARY ARTERY BYPASS GRAFTING (CABG)
Anesthesia: General | Site: Chest | Laterality: Right

## 2020-10-25 SURGERY — BEDSIDE EVACUATION OF HEMATOMA
Anesthesia: General | Site: Chest

## 2020-10-25 MED ORDER — ASPIRIN 81 MG PO CHEW
324.0000 mg | CHEWABLE_TABLET | Freq: Every day | ORAL | Status: DC
  Administered 2020-10-26 – 2020-11-01 (×6): 324 mg
  Filled 2020-10-25 (×6): qty 4

## 2020-10-25 MED ORDER — ROCURONIUM BROMIDE 10 MG/ML (PF) SYRINGE
PREFILLED_SYRINGE | INTRAVENOUS | Status: DC | PRN
  Administered 2020-10-25 (×3): 50 mg via INTRAVENOUS
  Administered 2020-10-25 (×2): 100 mg via INTRAVENOUS
  Administered 2020-10-25 (×3): 50 mg via INTRAVENOUS
  Administered 2020-10-25: 100 mg via INTRAVENOUS

## 2020-10-25 MED ORDER — SODIUM CHLORIDE 0.9% IV SOLUTION: Freq: Once | INTRAVENOUS | Status: DC

## 2020-10-25 MED ORDER — SODIUM CHLORIDE 0.9 % IV SOLN
1.0000 g | Freq: Once | INTRAVENOUS | Status: DC
  Filled 2020-10-25: qty 10

## 2020-10-25 MED ORDER — HEPARIN SODIUM (PORCINE) 1000 UNIT/ML IJ SOLN
INTRAMUSCULAR | Status: AC
  Filled 2020-10-25: qty 1

## 2020-10-25 MED ORDER — POTASSIUM CHLORIDE 10 MEQ/50ML IV SOLN
INTRAVENOUS | Status: AC
  Filled 2020-10-25: qty 50

## 2020-10-25 MED ORDER — AMIODARONE HCL IN DEXTROSE 360-4.14 MG/200ML-% IV SOLN
60.0000 mg/h | INTRAVENOUS | Status: AC
  Administered 2020-10-25 – 2020-10-26 (×2): 60 mg/h via INTRAVENOUS
  Filled 2020-10-25: qty 400

## 2020-10-25 MED ORDER — EPINEPHRINE 1 MG/10ML IJ SOSY
PREFILLED_SYRINGE | INTRAMUSCULAR | Status: DC | PRN
  Administered 2020-10-25: 100 ug via INTRAVENOUS
  Administered 2020-10-25: 900 ug via INTRAVENOUS
  Administered 2020-10-25 (×2): 1000 ug via INTRAVENOUS

## 2020-10-25 MED ORDER — DEXMEDETOMIDINE HCL IN NACL 400 MCG/100ML IV SOLN
0.0000 ug/kg/h | INTRAVENOUS | Status: DC
  Filled 2020-10-25: qty 100

## 2020-10-25 MED ORDER — PROPOFOL 10 MG/ML IV BOLUS
INTRAVENOUS | Status: DC | PRN
Start: 2020-10-25 — End: 2020-10-25
  Administered 2020-10-25 (×2): 50 mg via INTRAVENOUS

## 2020-10-25 MED ORDER — PLASMA-LYTE A IV SOLN
INTRAVENOUS | Status: DC | PRN
  Administered 2020-10-25: 1000 mL via INTRAVASCULAR

## 2020-10-25 MED ORDER — DEXMEDETOMIDINE HCL IN NACL 400 MCG/100ML IV SOLN: 0.4000 ug/kg/h | INTRAVENOUS | Status: DC

## 2020-10-25 MED ORDER — FENTANYL CITRATE (PF) 250 MCG/5ML IJ SOLN
INTRAMUSCULAR | Status: AC
  Filled 2020-10-25: qty 25

## 2020-10-25 MED ORDER — TRAMADOL HCL 50 MG PO TABS: 50.0000 mg | ORAL_TABLET | ORAL | Status: DC | PRN

## 2020-10-25 MED ORDER — SODIUM BICARBONATE 8.4 % IV SOLN
50.0000 meq | Freq: Once | INTRAVENOUS | Status: AC
  Administered 2020-10-25: 50 meq via INTRAVENOUS

## 2020-10-25 MED ORDER — ALBUTEROL SULFATE HFA 108 (90 BASE) MCG/ACT IN AERS
INHALATION_SPRAY | RESPIRATORY_TRACT | Status: DC | PRN
  Administered 2020-10-25: 6 via RESPIRATORY_TRACT
  Administered 2020-10-25: 4 via RESPIRATORY_TRACT

## 2020-10-25 MED ORDER — MIDAZOLAM HCL (PF) 10 MG/2ML IJ SOLN
INTRAMUSCULAR | Status: AC
  Filled 2020-10-25: qty 2

## 2020-10-25 MED ORDER — AMIODARONE HCL IN DEXTROSE 360-4.14 MG/200ML-% IV SOLN
INTRAVENOUS | Status: AC
  Administered 2020-10-25: 60 mg/h via INTRAMUSCULAR
  Filled 2020-10-25: qty 200

## 2020-10-25 MED ORDER — VASOPRESSIN 20 UNIT/ML IV SOLN
INTRAVENOUS | Status: DC | PRN
  Administered 2020-10-25 (×3): 2 [IU] via INTRAVENOUS
  Administered 2020-10-25 (×2): 1 [IU] via INTRAVENOUS
  Administered 2020-10-25: 5 [IU] via INTRAVENOUS
  Administered 2020-10-25 (×5): 2 [IU] via INTRAVENOUS
  Administered 2020-10-25: 11 [IU] via INTRAVENOUS

## 2020-10-25 MED ORDER — EPINEPHRINE HCL 5 MG/250ML IV SOLN IN NS
0.0000 ug/min | INTRAVENOUS | Status: DC
Start: 2020-10-25 — End: 2020-11-02
  Administered 2020-10-25: 8 ug/min via INTRAVENOUS
  Administered 2020-10-26: 6 ug/min via INTRAVENOUS
  Administered 2020-10-26 – 2020-10-27 (×2): 5 ug/min via INTRAVENOUS
  Administered 2020-10-29: 4 ug/min via INTRAVENOUS
  Administered 2020-10-29: 4.5 ug/min via INTRAVENOUS
  Administered 2020-10-29 – 2020-10-31 (×3): 6 ug/min via INTRAVENOUS
  Administered 2020-10-31 – 2020-11-01 (×3): 10 ug/min via INTRAVENOUS
  Filled 2020-10-25 (×11): qty 250

## 2020-10-25 MED ORDER — BISACODYL 10 MG RE SUPP: 10.0000 mg | Freq: Every day | RECTAL | Status: DC

## 2020-10-25 MED ORDER — HYDROMORPHONE BOLUS VIA INFUSION
0.2500 mg | INTRAVENOUS | Status: DC | PRN
  Filled 2020-10-25: qty 1

## 2020-10-25 MED ORDER — METOPROLOL TARTRATE 5 MG/5ML IV SOLN: 2.5000 mg | INTRAVENOUS | Status: DC | PRN

## 2020-10-25 MED ORDER — FAMOTIDINE 20 MG IN NS 100 ML IVPB
20.0000 mg | Freq: Two times a day (BID) | INTRAVENOUS | Status: DC
  Administered 2020-10-25: 20 mg via INTRAVENOUS
  Filled 2020-10-25: qty 100

## 2020-10-25 MED ORDER — ALBUMIN HUMAN 5 % IV SOLN
250.0000 mL | INTRAVENOUS | Status: AC | PRN
  Administered 2020-10-25 (×4): 12.5 g via INTRAVENOUS
  Filled 2020-10-25 (×2): qty 250

## 2020-10-25 MED ORDER — ACETAMINOPHEN 160 MG/5ML PO SOLN: 650.0000 mg | Freq: Once | ORAL | Status: DC

## 2020-10-25 MED ORDER — METOCLOPRAMIDE HCL 5 MG/ML IJ SOLN: 10.0000 mg | Freq: Four times a day (QID) | INTRAMUSCULAR | Status: DC

## 2020-10-25 MED ORDER — CALCIUM CHLORIDE 10 % IV SOLN
INTRAVENOUS | Status: DC | PRN
  Administered 2020-10-25: 300 mg via INTRAVENOUS
  Administered 2020-10-25: 200 mg via INTRAVENOUS
  Administered 2020-10-25: 100 mg via INTRAVENOUS
  Administered 2020-10-25 (×3): 200 mg via INTRAVENOUS
  Administered 2020-10-25: 100 mg via INTRAVENOUS
  Administered 2020-10-25: 300 mg via INTRAVENOUS
  Administered 2020-10-25: 1 g via INTRAVENOUS
  Administered 2020-10-25: 200 mg via INTRAVENOUS
  Administered 2020-10-25: 1 g via INTRAVENOUS
  Administered 2020-10-25: 200 mg via INTRAVENOUS

## 2020-10-25 MED ORDER — HEMOSTATIC AGENTS (NO CHARGE) OPTIME
TOPICAL | Status: DC | PRN
  Administered 2020-10-25 (×2): 1 via TOPICAL

## 2020-10-25 MED ORDER — CALCIUM CHLORIDE 10 % IV SOLN
1.0000 g | Freq: Once | INTRAVENOUS | Status: AC
  Administered 2020-10-25: 1 g via INTRAVENOUS

## 2020-10-25 MED ORDER — SODIUM CHLORIDE 0.45 % IV SOLN: INTRAVENOUS | Status: DC | PRN

## 2020-10-25 MED ORDER — PHENYLEPHRINE 40 MCG/ML (10ML) SYRINGE FOR IV PUSH (FOR BLOOD PRESSURE SUPPORT)
PREFILLED_SYRINGE | INTRAVENOUS | Status: DC | PRN
Start: 2020-10-25 — End: 2020-10-25
  Administered 2020-10-25 (×2): 80 ug via INTRAVENOUS
  Administered 2020-10-25 (×2): 120 ug via INTRAVENOUS

## 2020-10-25 MED ORDER — DOCUSATE SODIUM 100 MG PO CAPS: 200.0000 mg | ORAL_CAPSULE | Freq: Every day | ORAL | Status: DC

## 2020-10-25 MED ORDER — SODIUM CHLORIDE 0.9% IV SOLUTION: Freq: Once | INTRAVENOUS | Status: AC

## 2020-10-25 MED ORDER — HYDROMORPHONE HCL 1 MG/ML IJ SOLN: 0.5000 mg | INTRAMUSCULAR | Status: DC | PRN

## 2020-10-25 MED ORDER — MIDAZOLAM HCL (PF) 5 MG/ML IJ SOLN
INTRAMUSCULAR | Status: DC | PRN
  Administered 2020-10-25: 1 mg via INTRAVENOUS
  Administered 2020-10-25: 4 mg via INTRAVENOUS

## 2020-10-25 MED ORDER — MILRINONE LACTATE IN DEXTROSE 20-5 MG/100ML-% IV SOLN
0.3750 ug/kg/min | INTRAVENOUS | Status: DC
  Administered 2020-10-26 – 2020-10-29 (×6): 0.375 ug/kg/min via INTRAVENOUS
  Filled 2020-10-25 (×8): qty 100

## 2020-10-25 MED ORDER — ACETAMINOPHEN 500 MG PO TABS: 1000.0000 mg | ORAL_TABLET | Freq: Four times a day (QID) | ORAL | Status: DC

## 2020-10-25 MED ORDER — MIDAZOLAM HCL 2 MG/2ML IJ SOLN
1.0000 mg | INTRAMUSCULAR | Status: DC | PRN
Start: 2020-10-25 — End: 2020-10-25

## 2020-10-25 MED ORDER — POTASSIUM CHLORIDE 10 MEQ/50ML IV SOLN
10.0000 meq | INTRAVENOUS | Status: AC
  Administered 2020-10-25 (×2): 10 meq via INTRAVENOUS

## 2020-10-25 MED ORDER — SODIUM CHLORIDE 0.9 % IV SOLN: 250.0000 mL | INTRAVENOUS | Status: DC

## 2020-10-25 MED ORDER — LEVOFLOXACIN IN D5W 750 MG/150ML IV SOLN
750.0000 mg | INTRAVENOUS | Status: DC
Start: 2020-10-26 — End: 2020-10-25

## 2020-10-25 MED ORDER — ACETAMINOPHEN 650 MG RE SUPP: 650.0000 mg | Freq: Once | RECTAL | Status: DC

## 2020-10-25 MED ORDER — NOREPINEPHRINE 16 MG/250ML-% IV SOLN
0.0000 ug/min | INTRAVENOUS | Status: DC
  Administered 2020-10-25: 10 ug/min via INTRAVENOUS
  Filled 2020-10-25 (×3): qty 250

## 2020-10-25 MED ORDER — LACTATED RINGERS IV SOLN: INTRAVENOUS | Status: DC | PRN

## 2020-10-25 MED ORDER — HYDROMORPHONE HCL 1 MG/ML IJ SOLN
0.5000 mg | INTRAMUSCULAR | Status: DC | PRN
  Administered 2020-10-28 – 2020-10-29 (×2): 0.5 mg via INTRAVENOUS
  Filled 2020-10-25: qty 0.5

## 2020-10-25 MED ORDER — ROCURONIUM BROMIDE 10 MG/ML (PF) SYRINGE
PREFILLED_SYRINGE | INTRAVENOUS | Status: AC
  Filled 2020-10-25: qty 50

## 2020-10-25 MED ORDER — SODIUM CHLORIDE 0.9 % IV SOLN: INTRAVENOUS | Status: DC

## 2020-10-25 MED ORDER — PANTOPRAZOLE SODIUM 40 MG IV SOLR
40.0000 mg | INTRAVENOUS | Status: DC
  Administered 2020-10-26 – 2020-10-31 (×6): 40 mg via INTRAVENOUS
  Filled 2020-10-25 (×6): qty 40

## 2020-10-25 MED ORDER — TRANEXAMIC ACID 1000 MG/10ML IV SOLN
1.5000 mg/kg/h | INTRAVENOUS | Status: DC
  Filled 2020-10-25: qty 25

## 2020-10-25 MED ORDER — MIDAZOLAM-SODIUM CHLORIDE 100-0.9 MG/100ML-% IV SOLN
0.0000 mg/h | INTRAVENOUS | Status: DC
  Administered 2020-10-25: 2 mg/h via INTRAVENOUS
  Administered 2020-10-26 – 2020-10-27 (×2): 3 mg/h via INTRAVENOUS
  Administered 2020-10-29: 2 mg/h via INTRAVENOUS
  Administered 2020-10-29: 7 mg/h via INTRAVENOUS
  Administered 2020-10-30 – 2020-11-01 (×4): 6.5 mg/h via INTRAVENOUS
  Filled 2020-10-25 (×8): qty 100
  Filled 2020-10-25: qty 200
  Filled 2020-10-25: qty 100

## 2020-10-25 MED ORDER — HYDROMORPHONE HCL 1 MG/ML IJ SOLN: 0.5000 mg | Freq: Once | INTRAMUSCULAR | Status: DC

## 2020-10-25 MED ORDER — VANCOMYCIN HCL 500 MG/100ML IV SOLN
500.0000 mg | Freq: Two times a day (BID) | INTRAVENOUS | Status: DC
  Administered 2020-10-25 – 2020-10-27 (×4): 500 mg via INTRAVENOUS
  Filled 2020-10-25 (×5): qty 100

## 2020-10-25 MED ORDER — PANTOPRAZOLE SODIUM 40 MG PO TBEC: 40.0000 mg | DELAYED_RELEASE_TABLET | Freq: Every day | ORAL | Status: DC

## 2020-10-25 MED ORDER — MIDAZOLAM HCL 2 MG/2ML IJ SOLN: 1.0000 mg | INTRAMUSCULAR | Status: DC | PRN

## 2020-10-25 MED ORDER — CHLORHEXIDINE GLUCONATE CLOTH 2 % EX PADS
6.0000 | MEDICATED_PAD | Freq: Every day | CUTANEOUS | Status: DC
  Administered 2020-10-26 – 2020-11-01 (×7): 6 via TOPICAL

## 2020-10-25 MED ORDER — NITROGLYCERIN IN D5W 200-5 MCG/ML-% IV SOLN
INTRAVENOUS | Status: DC | PRN
  Administered 2020-10-25: 60 ug/min via INTRAVENOUS

## 2020-10-25 MED ORDER — CEFUROXIME SODIUM 1.5 G IV SOLR
1.5000 g | Freq: Three times a day (TID) | INTRAVENOUS | Status: DC
  Filled 2020-10-25 (×2): qty 1.5

## 2020-10-25 MED ORDER — FENTANYL CITRATE (PF) 100 MCG/2ML IJ SOLN
INTRAMUSCULAR | Status: DC | PRN
  Administered 2020-10-25: 100 ug via INTRAVENOUS
  Administered 2020-10-25: 250 ug via INTRAVENOUS
  Administered 2020-10-25: 50 ug via INTRAVENOUS
  Administered 2020-10-25: 100 ug via INTRAVENOUS
  Administered 2020-10-25: 250 ug via INTRAVENOUS

## 2020-10-25 MED ORDER — ALBUMIN HUMAN 5 % IV SOLN: INTRAVENOUS | Status: DC | PRN

## 2020-10-25 MED ORDER — VASOPRESSIN 20 UNITS/100 ML INFUSION FOR SHOCK
0.0000 [IU]/min | INTRAVENOUS | Status: DC
  Administered 2020-10-25 – 2020-10-29 (×9): 0.03 [IU]/min via INTRAVENOUS
  Administered 2020-10-30: 0.04 [IU]/min via INTRAVENOUS
  Administered 2020-10-30 – 2020-10-31 (×2): 0.03 [IU]/min via INTRAVENOUS
  Administered 2020-10-31: 0.04 [IU]/min via INTRAVENOUS
  Administered 2020-11-01 (×2): 0.03 [IU]/min via INTRAVENOUS
  Filled 2020-10-25 (×17): qty 100

## 2020-10-25 MED ORDER — EPINEPHRINE 1 MG/10ML IJ SOSY
PREFILLED_SYRINGE | INTRAMUSCULAR | Status: AC
  Filled 2020-10-25: qty 20

## 2020-10-25 MED ORDER — SODIUM CHLORIDE 0.9 % IV SOLN: 10.0000 mL/h | Freq: Once | INTRAVENOUS | Status: DC

## 2020-10-25 MED ORDER — DEXMEDETOMIDINE HCL IN NACL 400 MCG/100ML IV SOLN
0.4000 ug/kg/h | INTRAVENOUS | Status: DC
  Administered 2020-10-26: 0.9 ug/kg/h via INTRAVENOUS
  Administered 2020-10-26 – 2020-10-27 (×3): 0.7 ug/kg/h via INTRAVENOUS
  Filled 2020-10-25 (×4): qty 100

## 2020-10-25 MED ORDER — SODIUM CHLORIDE 0.9 % IV SOLN
0.5000 mg/h | INTRAVENOUS | Status: DC
  Administered 2020-10-25 (×2): 1.5 mg/h via INTRAVENOUS
  Administered 2020-10-26 – 2020-10-27 (×3): 3 mg/h via INTRAVENOUS
  Administered 2020-10-29: 4 mg/h via INTRAVENOUS
  Administered 2020-10-29 (×2): 2 mg/h via INTRAVENOUS
  Administered 2020-10-30 – 2020-11-01 (×5): 4 mg/h via INTRAVENOUS
  Filled 2020-10-25 (×17): qty 5

## 2020-10-25 MED ORDER — HEMOSTATIC AGENTS (NO CHARGE) OPTIME
TOPICAL | Status: DC | PRN
  Administered 2020-10-25 (×3): 1 via TOPICAL

## 2020-10-25 MED ORDER — METOPROLOL TARTRATE 25 MG/10 ML ORAL SUSPENSION: 12.5000 mg | Freq: Two times a day (BID) | ORAL | Status: DC

## 2020-10-25 MED ORDER — LACTATED RINGERS IV SOLN: INTRAVENOUS | Status: DC

## 2020-10-25 MED ORDER — ACETAMINOPHEN 160 MG/5ML PO SOLN: 1000.0000 mg | Freq: Four times a day (QID) | ORAL | Status: DC

## 2020-10-25 MED ORDER — MIDAZOLAM BOLUS VIA INFUSION
0.0000 mg | INTRAVENOUS | Status: DC | PRN
  Administered 2020-10-29: 2 mg via INTRAVENOUS
  Filled 2020-10-25: qty 5

## 2020-10-25 MED ORDER — VASOPRESSIN 20 UNIT/ML IV SOLN
INTRAVENOUS | Status: AC
  Filled 2020-10-25: qty 1

## 2020-10-25 MED ORDER — ONDANSETRON HCL 4 MG/2ML IJ SOLN: 4.0000 mg | Freq: Four times a day (QID) | INTRAMUSCULAR | Status: DC | PRN

## 2020-10-25 MED ORDER — SODIUM CHLORIDE 0.9% FLUSH: 3.0000 mL | INTRAVENOUS | Status: DC | PRN

## 2020-10-25 MED ORDER — HEPARIN SODIUM (PORCINE) 1000 UNIT/ML IJ SOLN
INTRAMUSCULAR | Status: DC | PRN
  Administered 2020-10-25: 30000 [IU] via INTRAVENOUS

## 2020-10-25 MED ORDER — ASPIRIN EC 325 MG PO TBEC: 325.0000 mg | DELAYED_RELEASE_TABLET | Freq: Every day | ORAL | Status: DC

## 2020-10-25 MED ORDER — SODIUM CHLORIDE 0.9 % IV SOLN
10.0000 mL/h | Freq: Once | INTRAVENOUS | Status: DC
Start: 2020-10-25 — End: 2020-10-27

## 2020-10-25 MED ORDER — DEXTROSE 50 % IV SOLN: 0.0000 mL | INTRAVENOUS | Status: DC | PRN

## 2020-10-25 MED ORDER — PROPOFOL 10 MG/ML IV BOLUS
INTRAVENOUS | Status: AC
  Filled 2020-10-25: qty 20

## 2020-10-25 MED ORDER — ALBUTEROL SULFATE HFA 108 (90 BASE) MCG/ACT IN AERS
INHALATION_SPRAY | RESPIRATORY_TRACT | Status: AC
  Filled 2020-10-25: qty 6.7

## 2020-10-25 MED ORDER — OXYCODONE HCL 5 MG PO TABS: 5.0000 mg | ORAL_TABLET | ORAL | Status: DC | PRN

## 2020-10-25 MED ORDER — DEXMEDETOMIDINE HCL IN NACL 400 MCG/100ML IV SOLN
0.1000 ug/kg/h | INTRAVENOUS | Status: DC
  Filled 2020-10-25: qty 100

## 2020-10-25 MED ORDER — SODIUM CHLORIDE (PF) 0.9 % IJ SOLN
OROMUCOSAL | Status: DC | PRN
  Administered 2020-10-25 (×4): 4 mL via TOPICAL

## 2020-10-25 MED ORDER — PHENYLEPHRINE HCL-NACL 20-0.9 MG/250ML-% IV SOLN: 0.0000 ug/min | INTRAVENOUS | Status: DC

## 2020-10-25 MED ORDER — SODIUM CHLORIDE 0.9% FLUSH
3.0000 mL | Freq: Two times a day (BID) | INTRAVENOUS | Status: DC
  Administered 2020-10-26 – 2020-11-01 (×10): 3 mL via INTRAVENOUS

## 2020-10-25 MED ORDER — HEPARIN 6000 UNIT IRRIGATION SOLUTION
Status: AC
  Filled 2020-10-25: qty 500

## 2020-10-25 MED ORDER — NITROGLYCERIN IN D5W 200-5 MCG/ML-% IV SOLN
0.0000 ug/min | INTRAVENOUS | Status: DC
Start: 2020-10-25 — End: 2020-10-28

## 2020-10-25 MED ORDER — METOPROLOL TARTRATE 12.5 MG HALF TABLET: 12.5000 mg | ORAL_TABLET | Freq: Two times a day (BID) | ORAL | Status: DC

## 2020-10-25 MED ORDER — ROCURONIUM BROMIDE 10 MG/ML (PF) SYRINGE
PREFILLED_SYRINGE | INTRAVENOUS | Status: AC
  Filled 2020-10-25: qty 10

## 2020-10-25 MED ORDER — AMIODARONE HCL IN DEXTROSE 360-4.14 MG/200ML-% IV SOLN
INTRAVENOUS | Status: DC | PRN
  Administered 2020-10-25 (×2): 60 mg/h via INTRAVENOUS

## 2020-10-25 MED ORDER — INSULIN REGULAR(HUMAN) IN NACL 100-0.9 UT/100ML-% IV SOLN
INTRAVENOUS | Status: DC
  Administered 2020-10-25: 12 [IU]/h via INTRAVENOUS
  Administered 2020-10-26: 19 [IU]/h via INTRAVENOUS
  Filled 2020-10-25: qty 200
  Filled 2020-10-25: qty 100

## 2020-10-25 MED ORDER — CALCIUM CHLORIDE 10 % IV SOLN: 1.0000 g | Freq: Once | INTRAVENOUS | Status: DC

## 2020-10-25 MED ORDER — MORPHINE SULFATE (PF) 2 MG/ML IV SOLN: 1.0000 mg | INTRAVENOUS | Status: DC | PRN

## 2020-10-25 MED ORDER — SODIUM CHLORIDE 0.9 % IV SOLN
1.5000 g | Freq: Three times a day (TID) | INTRAVENOUS | Status: DC
  Administered 2020-10-25 – 2020-10-30 (×14): 1.5 g via INTRAVENOUS
  Filled 2020-10-25 (×19): qty 1.5

## 2020-10-25 MED ORDER — AMIODARONE IV BOLUS ONLY 150 MG/100ML
INTRAVENOUS | Status: DC | PRN
  Administered 2020-10-25 (×2): 150 mg via INTRAVENOUS

## 2020-10-25 MED ORDER — MIDAZOLAM HCL 2 MG/2ML IJ SOLN: 2.0000 mg | INTRAMUSCULAR | Status: DC | PRN

## 2020-10-25 MED ORDER — PROTAMINE SULFATE 10 MG/ML IV SOLN
INTRAVENOUS | Status: DC | PRN
  Administered 2020-10-25: 50 mg via INTRAVENOUS
  Administered 2020-10-25 (×4): 25 mg via INTRAVENOUS

## 2020-10-25 MED ORDER — INSULIN REGULAR(HUMAN) IN NACL 100-0.9 UT/100ML-% IV SOLN
INTRAVENOUS | Status: DC | PRN
  Administered 2020-10-25: 6.5 [IU]/h via INTRAVENOUS

## 2020-10-25 MED ORDER — MAGNESIUM SULFATE 4 GM/100ML IV SOLN
4.0000 g | Freq: Once | INTRAVENOUS | Status: AC
  Administered 2020-10-25: 4 g via INTRAVENOUS
  Filled 2020-10-25: qty 100

## 2020-10-25 MED ORDER — SODIUM BICARBONATE 8.4 % IV SOLN
INTRAVENOUS | Status: DC | PRN
  Administered 2020-10-25 (×2): 50 meq via INTRAVENOUS

## 2020-10-25 MED ORDER — LACTATED RINGERS IV SOLN: 500.0000 mL | Freq: Once | INTRAVENOUS | Status: DC | PRN

## 2020-10-25 MED ORDER — CALCIUM CHLORIDE 10 % IV SOLN
INTRAVENOUS | Status: AC
  Filled 2020-10-25: qty 20

## 2020-10-25 MED ORDER — CHLORHEXIDINE GLUCONATE 0.12 % MT SOLN: 15.0000 mL | OROMUCOSAL | Status: DC

## 2020-10-25 MED ORDER — POTASSIUM CHLORIDE 10 MEQ/50ML IV SOLN
10.0000 meq | Freq: Once | INTRAVENOUS | Status: AC
  Administered 2020-10-25: 10 meq via INTRAVENOUS

## 2020-10-25 MED ORDER — DOCUSATE SODIUM 50 MG/5ML PO LIQD
100.0000 mg | Freq: Two times a day (BID) | ORAL | Status: DC
  Administered 2020-10-28 – 2020-10-29 (×3): 100 mg
  Filled 2020-10-25 (×6): qty 10

## 2020-10-25 MED ORDER — BISACODYL 5 MG PO TBEC
10.0000 mg | DELAYED_RELEASE_TABLET | Freq: Every day | ORAL | Status: DC
  Administered 2020-10-28 – 2020-10-29 (×2): 10 mg via ORAL
  Filled 2020-10-25 (×2): qty 2

## 2020-10-25 MED ORDER — POLYETHYLENE GLYCOL 3350 17 G PO PACK: 17.0000 g | PACK | Freq: Every day | ORAL | Status: DC

## 2020-10-25 MED ORDER — VANCOMYCIN HCL IN DEXTROSE 1-5 GM/200ML-% IV SOLN: 1000.0000 mg | Freq: Once | INTRAVENOUS | Status: DC

## 2020-10-25 MED ORDER — 0.9 % SODIUM CHLORIDE (POUR BTL) OPTIME
TOPICAL | Status: DC | PRN
  Administered 2020-10-25: 5000 mL

## 2020-10-25 MED ORDER — NOREPINEPHRINE 4 MG/250ML-% IV SOLN: 0.0000 ug/min | INTRAVENOUS | Status: DC

## 2020-10-25 MED ORDER — EPHEDRINE 5 MG/ML INJ
INTRAVENOUS | Status: AC
  Filled 2020-10-25: qty 5

## 2020-10-25 MED ORDER — DOPAMINE-DEXTROSE 3.2-5 MG/ML-% IV SOLN
0.0000 ug/kg/min | INTRAVENOUS | Status: DC
  Filled 2020-10-25: qty 250

## 2020-10-25 MED ORDER — HEPARIN SODIUM (PORCINE) 5000 UNIT/ML IJ SOLN
INTRAVENOUS | Status: DC
  Filled 2020-10-25: qty 10

## 2020-10-25 MED ORDER — HEPARIN SODIUM (PORCINE) 5000 UNIT/ML IJ SOLN
INTRAVENOUS | Status: DC
  Filled 2020-10-25 (×5): qty 10

## 2020-10-25 SURGICAL SUPPLY — 14 items
BNDG ESMARK 4X9 LF (GAUZE/BANDAGES/DRESSINGS) ×2 IMPLANT
COUNTER NEEDLE 20 DBL MAG RED (NEEDLE) ×2 IMPLANT
DRAPE INCISE IOBAN 66X45 STRL (DRAPES) ×2 IMPLANT
FELT TEFLON 1X6 (MISCELLANEOUS) ×6 IMPLANT
GAUZE SPONGE 4X4 12PLY STRL LF (GAUZE/BANDAGES/DRESSINGS) ×4 IMPLANT
GOWN STRL REIN XL XLG (GOWN DISPOSABLE) ×6 IMPLANT
KIT SUCTION CATH 14FR (SUCTIONS) ×2 IMPLANT
PAD SHARPS MAGNETIC DISPOSAL (MISCELLANEOUS) ×2 IMPLANT
SPONGE T-LAP 12X12 ~~LOC~~+RFID (SPONGE) ×2 IMPLANT
SPONGE T-LAP 4X18 ~~LOC~~+RFID (SPONGE) ×4 IMPLANT
SUT BONE WAX W31G (SUTURE) ×2 IMPLANT
SUT PROLENE 2 0 MH 48 (SUTURE) ×4 IMPLANT
SUT PROLENE 4 0 SH DA (SUTURE) ×2 IMPLANT
SUT SILK 1 TIES 10X30 (SUTURE) ×2 IMPLANT

## 2020-10-25 SURGICAL SUPPLY — 121 items
ADAPTER CARDIO PERF ANTE/RETRO (ADAPTER) ×1 IMPLANT
BAG DECANTER FOR FLEXI CONT (MISCELLANEOUS) ×5 IMPLANT
BANDAGE ESMARK 6X9 LF (GAUZE/BANDAGES/DRESSINGS) IMPLANT
BLADE CLIPPER SURG (BLADE) ×5 IMPLANT
BLADE STERNUM SYSTEM 6 (BLADE) ×5 IMPLANT
BLADE SURG 11 STRL SS (BLADE) ×1 IMPLANT
BNDG ELASTIC 4X5.8 VLCR STR LF (GAUZE/BANDAGES/DRESSINGS) ×5 IMPLANT
BNDG ELASTIC 6X5.8 VLCR STR LF (GAUZE/BANDAGES/DRESSINGS) ×5 IMPLANT
BNDG ESMARK 6X9 LF (GAUZE/BANDAGES/DRESSINGS) ×5
BNDG GAUZE ELAST 4 BULKY (GAUZE/BANDAGES/DRESSINGS) ×5 IMPLANT
CANISTER SUCT 3000ML PPV (MISCELLANEOUS) ×5 IMPLANT
CANNULA EZ GLIDE AORTIC 21FR (CANNULA) ×5 IMPLANT
CANNULA GUNDRY RCSP 15FR (MISCELLANEOUS) ×1 IMPLANT
CANNULA SUMP PERICARDIAL (CANNULA) ×1 IMPLANT
CATH ACCU-VU SIZ PIG 5F 100CM (CATHETERS) ×1 IMPLANT
CATH CPB KIT HENDRICKSON (MISCELLANEOUS) ×5 IMPLANT
CATH HEART VENT LEFT (CATHETERS) IMPLANT
CATH ROBINSON RED A/P 18FR (CATHETERS) ×6 IMPLANT
CATH THORACIC 36FR (CATHETERS) ×6 IMPLANT
CATH THORACIC 36FR RT ANG (CATHETERS) ×6 IMPLANT
CLIP FOGARTY SPRING 6M (CLIP) ×1 IMPLANT
CLIP RETRACTION 3.0MM CORONARY (MISCELLANEOUS) ×1 IMPLANT
CLIP TI LARGE 6 (CLIP) ×6 IMPLANT
CLIP TI WIDE RED SMALL 24 (CLIP) ×1 IMPLANT
CLIP VESOCCLUDE MED 24/CT (CLIP) IMPLANT
CLIP VESOCCLUDE SM WIDE 24/CT (CLIP) IMPLANT
CONN 3/8X3/8 GISH STERILE (MISCELLANEOUS) ×3 IMPLANT
CONN ST 3/8 X 1/2 (MISCELLANEOUS) ×1 IMPLANT
CONTAINER PROTECT SURGISLUSH (MISCELLANEOUS) ×6 IMPLANT
DEFOGGER ANTIFOG KIT (MISCELLANEOUS) ×1 IMPLANT
DERMABOND ADVANCED (GAUZE/BANDAGES/DRESSINGS) ×1
DERMABOND ADVANCED .7 DNX12 (GAUZE/BANDAGES/DRESSINGS) IMPLANT
DRAIN CHANNEL 32F RND 10.7 FF (WOUND CARE) ×2 IMPLANT
DRAPE CARDIOVASCULAR INCISE (DRAPES) ×5
DRAPE INCISE IOBAN 66X45 STRL (DRAPES) ×1 IMPLANT
DRAPE SRG 135X102X78XABS (DRAPES) ×4 IMPLANT
DRAPE WARM FLUID 44X44 (DRAPES) ×5 IMPLANT
DRSG COVADERM 4X14 (GAUZE/BANDAGES/DRESSINGS) ×5 IMPLANT
DRSG TEGADERM 4X4.5 CHG (GAUZE/BANDAGES/DRESSINGS) ×1 IMPLANT
ELECT BLADE 4.0 EZ CLEAN MEGAD (MISCELLANEOUS) ×5
ELECT REM PT RETURN 9FT ADLT (ELECTROSURGICAL) ×10
ELECTRODE BLDE 4.0 EZ CLN MEGD (MISCELLANEOUS) IMPLANT
ELECTRODE REM PT RTRN 9FT ADLT (ELECTROSURGICAL) ×8 IMPLANT
FELT TEFLON 1X6 (MISCELLANEOUS) ×10 IMPLANT
GAUZE SPONGE 4X4 12PLY STRL (GAUZE/BANDAGES/DRESSINGS) ×9 IMPLANT
GLOVE SURG 8.5 LATEX PF (GLOVE) ×1 IMPLANT
GLOVE SURG SIGNA 7.5 PF LTX (GLOVE) ×17 IMPLANT
GOWN STRL REUS W/ TWL LRG LVL3 (GOWN DISPOSABLE) ×16 IMPLANT
GOWN STRL REUS W/ TWL XL LVL3 (GOWN DISPOSABLE) ×8 IMPLANT
GOWN STRL REUS W/TWL LRG LVL3 (GOWN DISPOSABLE) ×20
GOWN STRL REUS W/TWL XL LVL3 (GOWN DISPOSABLE) ×10
GRAFT HEMASHIELD 10MM (Graft) ×5 IMPLANT
GRAFT VASC STRG 30X10STRL (Graft) IMPLANT
HEMOSTAT POWDER SURGIFOAM 1G (HEMOSTASIS) ×16 IMPLANT
HEMOSTAT SURGICEL 2X14 (HEMOSTASIS) ×6 IMPLANT
INSERT FOGARTY SM (MISCELLANEOUS) ×1 IMPLANT
INSERT FOGARTY XLG (MISCELLANEOUS) IMPLANT
KIT BASIN OR (CUSTOM PROCEDURE TRAY) ×5 IMPLANT
KIT SUCTION CATH 14FR (SUCTIONS) ×10 IMPLANT
KIT TURNOVER KIT B (KITS) ×5 IMPLANT
KIT VASOVIEW HEMOPRO 2 VH 4000 (KITS) ×5 IMPLANT
MARKER GRAFT CORONARY BYPASS (MISCELLANEOUS) ×15 IMPLANT
NS IRRIG 1000ML POUR BTL (IV SOLUTION) ×25 IMPLANT
PACK E OPEN HEART (SUTURE) ×5 IMPLANT
PACK OPEN HEART (CUSTOM PROCEDURE TRAY) ×5 IMPLANT
PAD ARMBOARD 7.5X6 YLW CONV (MISCELLANEOUS) ×10 IMPLANT
PAD ELECT DEFIB RADIOL ZOLL (MISCELLANEOUS) ×5 IMPLANT
PENCIL BUTTON HOLSTER BLD 10FT (ELECTRODE) ×5 IMPLANT
POSITIONER HEAD DONUT 9IN (MISCELLANEOUS) ×5 IMPLANT
POWDER SURGICEL 3.0 GRAM (HEMOSTASIS) ×2 IMPLANT
PUMP SET IMPELLA 5.5 US (CATHETERS) ×2 IMPLANT
PUNCH AORTIC ROTATE 4.0MM (MISCELLANEOUS) ×1 IMPLANT
PUNCH AORTIC ROTATE 4.5MM 8IN (MISCELLANEOUS) IMPLANT
PUNCH AORTIC ROTATE 5MM 8IN (MISCELLANEOUS) IMPLANT
SET MPS 3-ND DEL (MISCELLANEOUS) ×1 IMPLANT
SUPPORT HEART JANKE-BARRON (MISCELLANEOUS) ×5 IMPLANT
SUT BONE WAX W31G (SUTURE) ×5 IMPLANT
SUT ETHIBOND 2 0 SH (SUTURE) ×20
SUT ETHIBOND 2 0 SH 36X2 (SUTURE) IMPLANT
SUT ETHIBOND NAB MH 2-0 36IN (SUTURE) ×5 IMPLANT
SUT MNCRL AB 4-0 PS2 18 (SUTURE) ×1 IMPLANT
SUT PROLENE 2 0 MH 48 (SUTURE) ×2 IMPLANT
SUT PROLENE 3 0 SH 48 (SUTURE) ×1 IMPLANT
SUT PROLENE 3 0 SH DA (SUTURE) ×16 IMPLANT
SUT PROLENE 4 0 RB 1 (SUTURE) ×15
SUT PROLENE 4 0 SH DA (SUTURE) ×1 IMPLANT
SUT PROLENE 4-0 RB1 .5 CRCL 36 (SUTURE) IMPLANT
SUT PROLENE 5 0 C 1 36 (SUTURE) ×3 IMPLANT
SUT PROLENE 6 0 C 1 30 (SUTURE) ×17 IMPLANT
SUT PROLENE 7 0 BV 1 (SUTURE) ×3 IMPLANT
SUT PROLENE 7 0 BV1 MDA (SUTURE) ×6 IMPLANT
SUT PROLENE 8 0 BV175 6 (SUTURE) ×3 IMPLANT
SUT SILK  1 MH (SUTURE) ×25
SUT SILK 1 MH (SUTURE) IMPLANT
SUT SILK 1 TIES 10X30 (SUTURE) ×1 IMPLANT
SUT STEEL 6MS V (SUTURE) ×5 IMPLANT
SUT STEEL STERNAL CCS#1 18IN (SUTURE) IMPLANT
SUT STEEL SZ 6 DBL 3X14 BALL (SUTURE) ×5 IMPLANT
SUT VIC AB 1 CTX 36 (SUTURE) ×10
SUT VIC AB 1 CTX36XBRD ANBCTR (SUTURE) ×8 IMPLANT
SUT VIC AB 2-0 CT1 27 (SUTURE) ×5
SUT VIC AB 2-0 CT1 TAPERPNT 27 (SUTURE) IMPLANT
SUT VIC AB 2-0 CTX 27 (SUTURE) IMPLANT
SUT VIC AB 3-0 SH 27 (SUTURE)
SUT VIC AB 3-0 SH 27X BRD (SUTURE) IMPLANT
SUT VIC AB 3-0 X1 27 (SUTURE) IMPLANT
SUT VICRYL 4-0 PS2 18IN ABS (SUTURE) IMPLANT
SYR BULB IRRIG 60ML STRL (SYRINGE) ×1 IMPLANT
SYSTEM SAHARA CHEST DRAIN ATS (WOUND CARE) ×5 IMPLANT
TOWEL GREEN STERILE (TOWEL DISPOSABLE) ×5 IMPLANT
TOWEL GREEN STERILE FF (TOWEL DISPOSABLE) ×5 IMPLANT
TRAY FOLEY SLVR 16FR TEMP STAT (SET/KITS/TRAYS/PACK) ×5 IMPLANT
TUBE SUCTION CARDIAC 10FR (CANNULA) ×1 IMPLANT
TUBING LAP HI FLOW INSUFFLATIO (TUBING) ×5 IMPLANT
UNDERPAD 30X36 HEAVY ABSORB (UNDERPADS AND DIAPERS) ×5 IMPLANT
VENT LEFT HEART 12002 (CATHETERS) ×5
WATER STERILE IRR 1000ML POUR (IV SOLUTION) ×10 IMPLANT
WIRE EMERALD 3MM-J .035X260CM (WIRE) ×1 IMPLANT
WIRE G V18X300CM (WIRE) ×1 IMPLANT
WIRE HI TORQ VERSACORE J 260CM (WIRE) ×1 IMPLANT
YANKAUER SUCT BULB TIP NO VENT (SUCTIONS) ×1 IMPLANT

## 2020-10-25 SURGICAL SUPPLY — 10 items
BANDAGE ESMARK 6X9 LF (GAUZE/BANDAGES/DRESSINGS) ×1 IMPLANT
BNDG ESMARK 4X9 LF (GAUZE/BANDAGES/DRESSINGS) ×2 IMPLANT
BNDG ESMARK 6X9 LF (GAUZE/BANDAGES/DRESSINGS) ×2
DRAPE CHEST BREAST 15X10 FENES (DRAPES) ×2 IMPLANT
DRAPE INCISE IOBAN 85X60 (DRAPES) ×2 IMPLANT
GAUZE SPONGE 4X4 12PLY STRL (GAUZE/BANDAGES/DRESSINGS) ×2 IMPLANT
SPONGE LAP 18X18 X RAY DECT (DISPOSABLE) ×6 IMPLANT
SPONGE T-LAP 4X18 ~~LOC~~+RFID (SPONGE) ×4 IMPLANT
SUT PROLENE 2 0 MH 48 (SUTURE) ×2 IMPLANT
TAPE CLOTH SURG 4X10 WHT LF (GAUZE/BANDAGES/DRESSINGS) ×2 IMPLANT

## 2020-10-25 NOTE — Transfer of Care (Signed)
Immediate Anesthesia Transfer of Care Note  Patient: Veronica Swanson  Procedure(s) Performed: CORONARY ARTERY BYPASS GRAFTING (CABG) TIMES FOUR, ON PUMP, USING LEFT INTERNAL MAMMARY ARTERY AND ENDOSCOPICALLY HARVESTED RIGHT GREATER SAPHENOUS VEIN (Chest) TRANSESOPHAGEAL ECHOCARDIOGRAM (TEE) ENDOVEIN HARVEST OF GREATER SAPHENOUS VEIN (Right) APPLICATION OF CELL SAVER PLACEMENT OF IMPELLA LEFT VENTRICULAR ASSIST DEVICE CANNULATION FOR ECMO (EXTRACORPOREAL MEMBRANE OXYGENATION)  Patient Location: SICU  Anesthesia Type:General  Level of Consciousness: Patient remains intubated per anesthesia plan  Airway & Oxygen Therapy: Patient remains intubated per anesthesia plan and Patient placed on Ventilator (see vital sign flow sheet for setting)  Post-op Assessment: Report given to RN and Post -op Vital signs reviewed and stable  Post vital signs: Reviewed and stable  Last Vitals:  Vitals Value Taken Time  BP    Temp 35.6 C 11/02/2020 1708  Pulse 107 11/03/2020 1708  Resp 10 10/10/2020 1708  SpO2 100 % 10/14/2020 1708  Vitals shown include unvalidated device data.  Last Pain:  Vitals:   10/15/2020 0430  TempSrc: Oral  PainSc:       Patients Stated Pain Goal: 0 (10/24/20 2100)  Complications: No notable events documented.

## 2020-10-25 NOTE — OR Nursing (Signed)
Physician notified of blood build up in chest cavity. MD stated to move patient to ICU.

## 2020-10-25 NOTE — Progress Notes (Signed)
Patient transported from OR to room 2H22, on ventilator, without complications.

## 2020-10-25 NOTE — Discharge Instructions (Signed)

## 2020-10-25 NOTE — Brief Op Note (Signed)
10/29/2020 - 10/17/2020  6:10 PM  PATIENT:  Veronica Swanson  75 y.o. female  PRE-OPERATIVE DIAGNOSIS:  Coronary Artery Disease Left Main Disease  POST-OPERATIVE DIAGNOSIS:  Coronary Artery Disease Left Main Disease  PROCEDURE:  Procedure(s):  CORONARY ARTERY BYPASS GRAFTING x 4 -LIMA to LAD -SVG to DIAG -SEQ SVG to OM1 and OM2  ENDOSCOPIC HARVEST GREATER SAPHENOUS VEIN -Right Thigh (10 min/5 min)   CANNULATION FOR ECMO  PLACEMENT OF IMPELLA 5.5 VIA GRAFT in ASCENDING AORTA  TRANSESOPHAGEAL ECHOCARDIOGRAM (TEE) (N/A)  APPLICATION OF CELL SAVER  SURGEON:  Surgeon(s) and Role:    [name], MD - Primary  PHYSICIAN ASSISTANT: Lowella Dandy PA-C  ASSISTANTS: Bebe Shaggy RNFA   ANESTHESIA:   general  EBL:  3072 mL   BLOOD ADMINISTERED: 2U CC PRBC and CC CELLSAVER  DRAINS:  Left and Right Pleural Chest Tubes, Mediastinal Chest Drains    LOCAL MEDICATIONS USED:  BUPIVICAINE   SPECIMEN:  No Specimen  DISPOSITION OF SPECIMEN:  N/A  COUNTS:  YES  TOURNIQUET:  * No tourniquets in log *  DICTATION: .Dragon Dictation  PLAN OF CARE: Admit to inpatient   PATIENT DISPOSITION:  ICU - intubated and critically ill.   Delay start of Pharmacological VTE agent (>24hrs) due to surgical blood loss or risk of bleeding: yes

## 2020-10-25 NOTE — Hospital Course (Addendum)
HPI: This is a 75 year old female with a history of CAD, carotid disease, hypertension, hyperlipidemia, and borderline diabetes who presented to the ED with complaints of chest, hand, neck, and back. She ruled in for a NSTEMI. On cardiac catheterization, she was found to have severe CAD, left main disease included. Dr. Dorris Fetch discussed the need for coronary artery bypass grafting surgery. Potential risks, benefits, and complications of the surgery were discussed with the patient and she agreed to proceed with surgery. Pre operative carotid duplex US showed no significant internal carotid artery stenosis bilaterally. Hospital Course: Patient had a cardiac arrest and code blue was called in the OR while patient was being prepped and draped.  CPR was initiated. Patient then underwent a CABG x 4 LIMA to LAD, SVG to Diagonal and Sequential SVG to OM 1 and OM 2.  The patient was also cannulated for ECMO due to difficulty weaning from coronary bypass procedure.  Patient was transferred from the OR to [facility] ICU in stable but critical condition.  The patient require inotropic and pressure support coming out of the operating room.  These included Levophed, Milrinone, and Epinephrine, and Vasopressin.  Critical care and Advanced heart failure were obtained.  The patient was coagulopathic and treated with multiple blood products.  The evening of surgery she required chest exploration for removal of blood clot with replacement of packing.  The patient remained on broad spectrum antibiotic coverage due to her chest being open.  She continued to require multiple transfusions due to persistent blood loss.  She required multiple chest explorations for evacuations of blood clot, most recently on 09/24. Cortrak was placed and tube feeding initiated. Her rhythm was afib/flutter on 09/25. She was put on an Amiodarone drip.  On 10/23/2020 she was found to have evidence of acute left lower extremity ischemia and vascular surgical  consultation was obtained with Dr. Randie Heinz.  She was taken to the operating room where she underwent a left common femoral endarterectomy with bovine pericardial patch angioplasty as well as aortogram/left lower extremity angiogram.  She was brought back to the ICU and remains in critical condition on ECMO and Impella.  She remains on multiple pressors as well as vasopressin and continues to show evidence of bleeding in the chest.  She will undergo a washout as well as platelet/blood transfusion.  She is making urine but creatinine is increased to 2.26.  She had persistent bleeding complications from the ECMO support system.  She was returned to the operating room on 10/19/2020 by Dr. Dorris Fetch exploration of the mediastinum for bleeding.  She developed GI bleeding as well.  She was transfused with packed red cells on 10/31/2020.  Chest x-ray on the following morning showed opacification of the right lung.  Bronchoscopy was carried out by the critical care team on 9/27.  She was noted to have copious tenacious or blood-tinged secretions in the proximal airways".  She developed monomorphic V. tach during the procedure that resolved with single bout of the defibrillator.  Few more episodes of V. tach followed, the heart failure team increase the amiodarone to 60 mg/h.

## 2020-10-25 NOTE — Progress Notes (Signed)
  Echocardiogram Echocardiogram Transesophageal has been performed.  Veronica Swanson 10/28/2020, 9:19 AM

## 2020-10-25 NOTE — Progress Notes (Signed)
ECMO PROGRESS NOTE  NAME:  Veronica Swanson, MRN:  [id number], DOB:  16-Nov-1945, LOS: 4 ADMISSION DATE:  10/24/2020, CONSULTATION DATE:  10/09/2020 REFERRING MD:  Roxan Hockey, CHIEF COMPLAINT:  VA-ECMO   HPI/course in hospital  75 year old woman who was placed on VA-ECMO support following CABGx4.  She presented 4 days ago with CCS class 4 angina and ruled in for NSTEMI. Echo showed EF of 35% with mild mitral regurgitation and an anterior wall regional wall motion abnormalities.  Coronary catheterization revealed triple-vessel disease.  LVEDP was normal at that time.  She was scheduled for urgent CABG.  This morning she began experiencing retrosternal chest pain around the time of arrival in the operating room.  She received a standard cardiac conduction with no hypotension.  However, she became progressively hypotensive and suffered a cardiac arrest likely due to a further ischemic event.  She was urgently cannulated for cardiopulmonary bypass.  She developed severe pulmonary edema and was very difficult to oxygenate.  She underwent SL bypass with reasonable targets.  She could not separate from bypass due to pulmonary edema and poor LV function.  She was converted to [facility] ECMO and a 5 5 Impella was placed and she was transferred to the ICU.  Past Medical History   Past Medical History:  Diagnosis Date   Carotid artery occlusion    Coronary artery disease    Hyperlipidemia    Hypertension    Myocardial infarction ([place]) 1997     Past Surgical History:  Procedure Laterality Date   ABDOMINAL HYSTERECTOMY     CARDIAC CATHETERIZATION     LEFT HEART CATH AND CORONARY ANGIOGRAPHY N/A 10/29/2020   Procedure: LEFT HEART CATH AND CORONARY ANGIOGRAPHY;  Surgeon: Nigel Mormon, MD;  Location: [place] CV LAB;  Service: Cardiovascular;  Laterality: N/A;     Review of Systems:   Review of Systems  Unable to perform ROS: Critical illness   Social History   reports that she  has been smoking cigarettes. She has never used smokeless tobacco. She reports that she does not drink alcohol and does not use drugs.   Family History   Her family history includes Heart disease in her father and mother; Hypertension in her father. There is no history of Thyroid disease.   Allergies Allergies  Allergen Reactions   Penicillins Other (See Comments)    Dizzy      Home Medications  Prior to Admission medications   Medication Sig Start Date End Date Taking? Authorizing Provider  amLODipine (NORVASC) 5 MG tablet Take 5 mg by mouth every morning. 08/29/20  Yes [provider]  aspirin 81 MG EC tablet Take 162 mg by mouth every morning. Swallow whole.   Yes [provider]  latanoprost (XALATAN) 0.005 % ophthalmic solution Place 1 drop into both eyes at bedtime. 01/15/20  Yes [provider]  losartan-hydrochlorothiazide (HYZAAR) 50-12.5 MG tablet TAKE 1 TABLET BY MOUTH EVERY DAY Patient taking differently: Take 1 tablet by mouth every morning. 04/06/19  Yes Miquel Dunn, NP  Multiple Vitamin (MULTIVITAMIN WITH MINERALS) TABS tablet Take 1 tablet by mouth every morning. Centrum Silver   Yes [provider]  rosuvastatin (CRESTOR) 20 MG tablet TAKE 1 TABLET BY MOUTH EVERY DAY Patient taking differently: Take 20 mg by mouth every morning. 04/02/19  Yes Adrian Prows, MD     Interim history/subjective:  Brought to ICU on norepinephrine, epinephrine, milrinone. Received 6  units FFP 1 unit platelet 2 units PRBC in the OR.  Objective   Blood pressure 112/67, pulse 60, temperature 97.8 F (36.6 C), temperature source Oral, resp. rate 18, height _0  (1.702 m), weight 68 kg, SpO2 97 %. PAP: (24-37)/(2-23) 37/23      Intake/Output Summary (Last 24 hours) at 10/12/2020 1636 Last data filed at 10/07/2020 1618 Gross per 24 hour  Intake 7951 ml  Output 7297 ml  Net 654 ml   Filed Weights   10/15/2020 1600  Weight: 68 kg     Examination: Physical Exam Constitutional:      Interventions: She is sedated and intubated.  HENT:     Head:     Comments: Orally intubated with protruding tongue Eyes:     Conjunctiva/sclera: Conjunctivae normal.  Cardiovascular:     Rate and Rhythm: Normal rate.     Pulses: Normal pulses.     Heart sounds: Heart sounds are distant.    No friction rub.  Pulmonary:     Effort: She is intubated.     Breath sounds: Normal breath sounds.     Comments: On rest ECMO ventilator settings tidal volume 50 mL Chest:     Comments: Open chest with Dacron patch.  Minimal chest tube drainage but increasing bleeding around wound. Abdominal:     General: Abdomen is flat.  Genitourinary:    Comments: Minimal urine output via Foley. Musculoskeletal:     Right lower leg: No edema.     Left lower leg: No edema.  Neurological:     GCS: GCS eye subscore is 1. GCS verbal subscore is 1. GCS motor subscore is 1.     Ancillary tests (personally reviewed)  CBC: Recent Labs  Lab 10/17/2020 1333 10/22/20 0051 10/23/20 0351 10/24/20 0233 10/18/2020 0305 10/20/2020 0808 10/13/2020 1038 10/08/2020 1047 10/24/2020 1519 10/16/2020 1523 11/02/2020 1534 10/13/2020 1537 10/24/2020 1545  WBC 4.4 6.0 5.7 5.3 4.6  --   --   --   --   --   --   --   --   NEUTROABS 1.5*  --   --   --   --   --   --   --   --   --   --   --   --   HGB 13.0 12.2 10.9* 10.8* 10.4*   < > 7.7*   < > 9.2* 9.2* 9.2* 9.9* 9.9*  HCT 39.5 37.4 33.8* 32.4* 31.8*   < > 22.7*   < > 27.0* 27.0* 27.0* 29.0* 29.0*  MCV 85.9 85.4 86.2 85.5 85.7  --   --   --   --   --   --   --   --   PLT 264 246 234 207 207  --  86*  --   --   --   --   --   --    < > = values in this interval not displayed.    Basic Metabolic Panel: Recent Labs  Lab 11/03/2020 1333 10/22/20 1759 10/23/20 0351 10/24/20 0233 10/11/2020 0305 10/06/2020 0808 11/02/2020 1333 10/24/2020 1407 10/24/2020 1430 11/04/2020 1519 11/03/2020 1523 11/03/2020 1534 10/16/2020 1537 10/24/2020 1545  NA  137 136 138 138 136   < > 144   < > 142 145 144 144 144 145  K 3.6 3.0* 3.3* 3.7 3.7   < > 3.2*   < > 3.6 3.7 3.8 3.3* 3.3* 3.2*  CL 103 106 106 107 105   < >  104  --  99  --  99 100  --  100  CO2 _0 21*  --   --   --   --   --   --   --   --   --   GLUCOSE 100* 126* 97 111* 100*   < > 165*  --  176*  --  216* 218*  --  218*  BUN _1 < > 12  --  10  --  9 9  --  9  CREATININE 1.38* 1.34* 1.33* 1.52* 1.66*   < > 1.00  --  0.70  --  0.80 0.80  --  0.80  CALCIUM 9.3 8.3* 8.4* 8.1* 8.1*  --   --   --   --   --   --   --   --   --    < > = values in this interval not displayed.   GFR: Estimated Creatinine Clearance: 58.2 mL/min (by C-G formula based on SCr of 0.8 mg/dL). Recent Labs  Lab 10/22/20 0051 10/23/20 0351 10/24/20 0233 10/18/2020 0305  WBC 6.0 5.7 5.3 4.6    Liver Function Tests: Recent Labs  Lab 11/03/2020 1333  AST 24  ALT 14  ALKPHOS 25*  BILITOT 0.5  PROT 7.3  ALBUMIN 3.9   No results for input(s): LIPASE, AMYLASE in the last 168 hours. No results for input(s): AMMONIA in the last 168 hours.  ABG    Component Value Date/Time   PHART 7.429 10/07/2020 1537   PCO2ART 31.3 (L) 10/24/2020 1537   PO2ART 433 (H) 10/17/2020 1537   HCO3 20.7 10/12/2020 1537   TCO2 22 10/12/2020 1545   ACIDBASEDEF 3.0 (H) 10/20/2020 1537   O2SAT 100.0 10/08/2020 1537     Coagulation Profile: Recent Labs  Lab 10/06/2020 0305  INR 1.2    Cardiac Enzymes: No results for input(s): CKTOTAL, CKMB, CKMBINDEX, TROPONINI in the last 168 hours.  HbA1C: Hgb A1c MFr Bld  Date/Time Value Ref Range Status  10/11/2020 06:58 PM 6.1 (H) 4.8 - 5.6 % Final    Comment:    (NOTE) Pre diabetes:          5.7%-6.4%  Diabetes:              >6.4%  Glycemic control for   <7.0% adults with diabetes   12/09/2017 11:42 AM 6.0 (H) 4.8 - 5.6 % Final    Comment:             Prediabetes: 5.7 - 6.4          Diabetes: >6.4          Glycemic control for adults with diabetes: <7.0      CBG: Recent Labs  Lab 10/11/2020 1717  GLUCAP 76     Assessment & Plan:  Critically ill due to cardiogenic and vasoplegic shock following CABG complicated by perioperative cardiac arrest requiring inotropic, vasopressor mechanical cardiac support and VA ECMO  Ischemic cardiomyopathy due to recent acute non-ST elevation coronary syndrome Critically ill due to acute hypoxic respiratory failure requiring mechanical ventilation and ECMO support Acute kidney injury Acute blood loss anemia Coagulopathy secondary to cardiopulmonary bypass  Plan:  -Immediate goal is to achieve adequate hemostasis: Transfuse cryoprecipitate and 2 units PRBC, continue TXA.  Reassess coagulation profile.  If minimal chest tube output after 6 hours will initiate ECMO anticoagulation at low-dose with goal heparin level 0.3. -Maintain ECMO flow 2.5-3  L/min, support intravascular volume with colloid and blood products as necessary as will be volume seeking for first 24-48 hours of ECMO run. -Titrate vasopressin and norepinephrine to keep MAP 70-80 -Titrate epinephrine to normal SCV O2 -Continue fixed dose milrinone for RV support -Continue ultra protective lung ventilation.  Follow for spontaneous recovery of tidal volume as lung compliance improves. -We will need fluid removal once hemodynamics improve and vasoplegic state stabilizes.  Anticipate need for CRRT initiation in next 48 hours.  Daily Goals Checklist  Pain/Anxiety/Delirium protocol (if indicated): Dilaudid and Versed infusions VAP protocol (if indicated): Bundle in place Respiratory support goals: ECMO rest setting, keep plateau pressure less than 25 Blood pressure target: MAP 70-80 DVT prophylaxis: Full anticoagulation for ECMO Nutritional status and feeding goals: Defer nutrition initiation for 24 to 48 hours GI prophylaxis: PPI Fluid status goals: Support intravascular volume with colloid Urinary catheter: Guide hemodynamic management Central  lines: Right IJ MAC introducer, left radial art line Glucose control: Insulin infusion by EndoTool Mobility/therapy needs: Bedrest Antibiotic de-escalation: Vancomycin and cefuroxime for open chest Home medication reconciliation: On hold Daily labs: Per ECMO protocol Code Status: Full code Family Communication: Per Dr. Roxan Hockey Disposition: ICU  CRITICAL CARE Performed by: Kipp Brood   Total critical care time: 90 minutes  Critical care time was exclusive of separately billable procedures and treating other patients.  Critical care was necessary to treat or prevent imminent or life-threatening deterioration.  Critical care was time spent personally by me on the following activities: development of treatment plan with patient and/or surrogate as well as nursing, discussions with consultants, evaluation of patient's response to treatment, examination of patient, obtaining history from patient or surrogate, ordering and performing treatments and interventions, ordering and review of laboratory studies, ordering and review of radiographic studies, pulse oximetry, re-evaluation of patient's condition and participation in multidisciplinary rounds.  Kipp Brood, MD [facility] ICU Physician [place]  Pager: [phone] Mobile: [phone] After hours: [phone].    10/11/2020, 4:36 PM

## 2020-10-25 NOTE — Progress Notes (Signed)
  Echocardiogram 2D Echocardiogram has been performed.  Veronica Swanson F 11/02/2020, 6:15 PM

## 2020-10-25 NOTE — Interval H&P Note (Signed)
History and Physical Interval Note: Creatinine up slightly again today at 1.66 Given ongoing episodes of unstable CP, we need to proceed with CABG 10/10/2020 7:08 AM  Veronica Swanson  has presented today for surgery, with the diagnosis of CAD LMD.  The various methods of treatment have been discussed with the patient and family. After consideration of risks, benefits and other options for treatment, the patient has consented to  Procedure(s): CORONARY ARTERY BYPASS GRAFTING (CABG) (N/A) TRANSESOPHAGEAL ECHOCARDIOGRAM (TEE) (N/A) as a surgical intervention.  The patient's history has been reviewed, patient examined, no change in status, stable for surgery.  I have reviewed the patient's chart and labs.  Questions were answered to the patient's satisfaction.     [name]

## 2020-10-25 NOTE — Progress Notes (Signed)
  ECMO INITIATION   Patient: Veronica Swanson, October 23, 1945, 75 y.o. Location:   Date of Service:  10/11/2020     Time: 4:13 PM  Date of Admission: 10/15/2020 Admitting diagnosis: NSTEMI (non-ST elevated myocardial infarction) (HCC)  Ht: 5\' 7"  (170.2 cm) Wt: 68 kg BSA: Body surface area is 1.79 meters squared.  Blood Type: A POS Allergies:  Allergies  Allergen Reactions   Penicillins Other (See Comments)    Dizzy     Past medical history:  Past Medical History:  Diagnosis Date   Carotid artery occlusion    Coronary artery disease    Hyperlipidemia    Hypertension    Myocardial infarction (HCC) 1997   Past surgical history:  Past Surgical History:  Procedure Laterality Date   ABDOMINAL HYSTERECTOMY     CARDIAC CATHETERIZATION     LEFT HEART CATH AND CORONARY ANGIOGRAPHY N/A 10/28/2020   Procedure: LEFT HEART CATH AND CORONARY ANGIOGRAPHY;  Surgeon: 10/23/2020, MD;  Location: [facility] INVASIVE CV LAB;  Service: Cardiovascular;  Laterality: N/A;    Indication for ECMO: Post-Cardiotomy  ECMO was deployed at 1330 and initiated at 1452  Anticoagulation not given, ACT >400. Cannulated for VA    .    ECMO Cannula Information     Staff Present  Primary Perfusionist Hendrickson MD  Assisting Perfusionist/ECMO Specialist Elder Negus RN and Devota Pace   Cannulating Physician Larry Sierras MD   ECMO Lot Numbers  CardioHelp Console  Dorris Fetch  Oxygenator  [id number]  Tubing Pack  [id number]  ECMO Goals  Flow goal     2.5-3.0LPM  Anticoagulation goal     N/A  Cardiac goal     Map >65  Respiratory goal     > 90%  Other goal        ECMO Handoff  Patient Information * Age Height Weight BSA IBW BMI  75 y.o. 5\' 7"  (170.2 cm)  (68 kg Body surface area is 1.79 meters squared. No data recorded Body mass index is 23.49 kg/m.   Review History * Primary Diagnosis   NSTEMI (non-ST elevated myocardial infarction) [facility])  Prior Cardiac Arrest within 24hrs of  ECMO initiation? yes  ECMO and MCS * Type ECMO Flow ECMO Sweep Gases    VA 3.03 4            Additional Mechanical Support Impella  Ventilation *          Cannula Size and Locations       Drainage 36/46 RA   Return 21 Ao    *Cannula(e) sutured and anchored, secured and dressed. yes  Labs and Imaging *  *Cannulation position verified via imaging on arrival to ICU. Concerns communicated to attending surgeon. Labs reviewed. yes  All ECMO safety checks complete. ECMO flowsheet initiated, applicable charges captured, LDA's entered/confirmed, imaging and labs verified, blood products available, and report given to [facility].

## 2020-10-25 NOTE — Progress Notes (Addendum)
Pharmacy Antibiotic Note  Veronica Swanson is a 75 y.o. female admitted on 08-Nov-2020.  Pharmacy has been consulted for vancomycin dosing for open chest and on ECMO. Vancomycin 125mg  x1 given prior to surgery. Patient on levofloxacin for PCN allergy documented as dizziness and one time doses of cefazolin given x2 today  Plan: Vancomycin 500mg  IV q12h based on nomogram  Change levofloxacin to cefuroxime 1.5g q8h per discussion with Dr.  Monitor renal function, cultures, and clinical progression  Height: 5\' 7"  (170.2 cm) Weight: 68 kg (150 lb) IBW/kg (Calculated) : 61.6  Temp (24hrs), Avg:97.9 F (36.6 C), Min:97.8 F (36.6 C), Max:98 F (36.7 C)  Recent Labs  Lab 10/22/20 0051 10/22/20 1759 10/23/20 0351 10/24/20 0233 10/17/2020 0305 10/23/2020 0808 10/28/2020 1333 11/03/2020 1430 10/13/2020 1523 10/26/2020 1534 10/09/2020 1545 10/17/2020 1555  WBC 6.0  --  5.7 5.3 4.6  --   --   --   --   --   --  5.7  CREATININE  --    < > 1.33* 1.52* 1.66*   < > 1.00 0.70 0.80 0.80 0.80  --    < > = values in this interval not displayed.    Estimated Creatinine Clearance: 58.2 mL/min (by C-G formula based on SCr of 0.8 mg/dL).    Allergies  Allergen Reactions   Penicillins Other (See Comments)    Dizzy     Antimicrobials this admission: Vancomycin 9/20 >>  Cefuroxime 9/20 >>  Dose adjustments this admission: N/a  Microbiology results:   Thank you for allowing pharmacy to be a part of this patient's care.  10/23/2020, PharmD, BCPS Clinical Pharmacist  10/21/2020 6:40 PM

## 2020-10-25 NOTE — Anesthesia Procedure Notes (Signed)
Procedure Name: Intubation Date/Time: 10/23/2020 8:00 AM Performed by: Thelma Comp, CRNA Pre-anesthesia Checklist: Patient identified, Emergency Drugs available, Suction available and Patient being monitored Patient Re-evaluated:Patient Re-evaluated prior to induction Oxygen Delivery Method: Circle System Utilized Preoxygenation: Pre-oxygenation with 100% oxygen Induction Type: IV induction Ventilation: Mask ventilation without difficulty and Oral airway inserted - appropriate to patient size Laryngoscope Size: Mac and 3 Grade View: Grade I Tube type: Oral Tube size: 8.0 mm Number of attempts: 1 Airway Equipment and Method: Stylet and Oral airway Placement Confirmation: ETT inserted through vocal cords under direct vision, positive ETCO2 and breath sounds checked- equal and bilateral Secured at: 22 cm Tube secured with: Tape Dental Injury: Teeth and Oropharynx as per pre-operative assessment

## 2020-10-25 NOTE — Anesthesia Procedure Notes (Signed)
Arterial Line Insertion Start/End09/25/2022 7:00 AM, 10/25/2020 7:05 AM Performed by: Carlos American, CRNA, CRNA  Patient location: Pre-op. Preanesthetic checklist: patient identified, IV checked, site marked, risks and benefits discussed, surgical consent, monitors and equipment checked, pre-op evaluation, timeout performed and anesthesia consent Lidocaine 1% used for infiltration Left, radial was placed Catheter size: 20 G Hand hygiene performed  and maximum sterile barriers used   Attempts: 1 Procedure performed without using ultrasound guided technique. Following insertion, dressing applied and Biopatch. Post procedure assessment: normal and unchanged  Patient tolerated the procedure well with no immediate complications.

## 2020-10-25 NOTE — Procedures (Signed)
      [address]       [place] [address]             [phone]       Mrs. Donaghey developed progressive increased blood collection under the esmarch on her chest.  She was already under deep sedation.  Using sterile technique the esmarch dressing was opened from the right side. ~ 1750 ml of clot and blood was evacuated. There was some bleeding superiorly and blood coming from behind the heart. It was not possible to directly address either location. Both areas were packed with 4 x 18 gauze sponges which decreased the bleeding significantly.  The esmark dressing was resutured to the skin.  She remained hemodynamically unchanged other than a decrease in PA pressures after removing clot.  Salvatore Decent Dorris Fetch, MD [facility] [phone]

## 2020-10-25 NOTE — Anesthesia Postprocedure Evaluation (Signed)
Anesthesia Post Note  Patient: Veronica Swanson  Procedure(s) Performed: CORONARY ARTERY BYPASS GRAFTING (CABG) TIMES FOUR, ON PUMP, USING LEFT INTERNAL MAMMARY ARTERY AND ENDOSCOPICALLY HARVESTED RIGHT GREATER SAPHENOUS VEIN (Chest) TRANSESOPHAGEAL ECHOCARDIOGRAM (TEE) ENDOVEIN HARVEST OF GREATER SAPHENOUS VEIN (Right) APPLICATION OF CELL SAVER PLACEMENT OF IMPELLA LEFT VENTRICULAR ASSIST DEVICE CANNULATION FOR ECMO (EXTRACORPOREAL MEMBRANE OXYGENATION)     Patient location during evaluation: SICU Anesthesia Type: General Level of consciousness: sedated Pain management: pain level controlled Vital Signs Assessment: post-procedure vital signs reviewed and stable Respiratory status: patient remains intubated per anesthesia plan Cardiovascular status: stable Postop Assessment: no apparent nausea or vomiting Anesthetic complications: no Comments: Unable to separate from cardiopulmonary bypass despite multiple attempts. Decision made to place Impella and initiate VA ECMO. Patient received multiple units of PRBC's, FFP, and platelets intraoperatively.Baseline TEG obtained. Cryoprecipitate recommended to the ICU team. Patient transported to the ICU on a ventilator with respiratory therapy present.    No notable events documented.  Last Vitals:  Vitals:   10/23/2020 1732 10/12/2020 2000  BP:    Pulse:  (!) 110  Resp:  (!) 0  Temp:  (!) 35.9 C  SpO2: 100% 100%    Last Pain:  Vitals:   10/31/2020 0430  TempSrc: Oral  PainSc:                  Catheryn Bacon Khalilah Hoke

## 2020-10-25 NOTE — Consult Note (Signed)
Advanced Heart Failure/ECMO Team Consult Note   Primary Physician: Raymon Mutton., FNP PCP-Cardiologist:  None  Reason for Consultation: Institution of [facility]  HPI:    Veronica Swanson is seen today for ECMO initiation at the request of Dr. Dorris Fetch.   75 y.o. with history of HTN, hyperlipidemia and prior PCI to LCx in 1997 as well as smoking (recently quit) was admitted on 9/16 with chest pain and NSTEMI.  HS-TnI elevated around 2000.  She underwent cath showing 80% distal left main stenosis, totally occluded proximal LCx with collaterals, 70% distal LAD.  Echo showed EF 35-40% with regional WMAs and normal RV.  Decision was made for CABG.   Patient arrived in OR this morning for CABG.  She was having chest pain in the OR.  She became hypotensive and had a cardiac arrest in the OR and was urgently cannulated for cardiopulmonary bypass.  She had CABG x 4 with LIMA-LAD, SVG-D, and seq SVG-OM1 and OM2.  She was unable to wean from bypass due to pulmonary edema with inability to successfully oxygenate.  Given inability to separate from CPB due to inability to oxygen, decision was made to place her on [place] ECMO.  She had central cannulation with arterial limb at the ascending aorta and venous limb at the RA.  We elected to place Impella 5.5 for LV decompression.  Initial placement was complicated wire perforation of the left ventricle which was repaired.  Subsequent placement was successful with position confirmed by TEE and fluoroscopy.   Patient required multiple units of blood products in the OR and after arriving in [facility].  She was noted to have massive hemoptysis while in the OR.  She was stabilized and transferred to the 2H unit where she subsequently had a chest washout.  The chest was left open.    Most recent ABG 7.32/44/391.   Review of Systems: Unable to obtain.   Home Medications Prior to Admission medications   Medication Sig Start Date End Date Taking? Authorizing Provider   amLODipine (NORVASC) 5 MG tablet Take 5 mg by mouth every morning. 08/29/20  Yes [provider]  aspirin 81 MG EC tablet Take 162 mg by mouth every morning. Swallow whole.   Yes [provider]  latanoprost (XALATAN) 0.005 % ophthalmic solution Place 1 drop into both eyes at bedtime. 01/15/20  Yes [provider]  losartan-hydrochlorothiazide (HYZAAR) 50-12.5 MG tablet TAKE 1 TABLET BY MOUTH EVERY DAY Patient taking differently: Take 1 tablet by mouth every morning. 04/06/19  Yes Toniann Fail, NP  Multiple Vitamin (MULTIVITAMIN WITH MINERALS) TABS tablet Take 1 tablet by mouth every morning. Centrum Silver   Yes [provider]  rosuvastatin (CRESTOR) 20 MG tablet TAKE 1 TABLET BY MOUTH EVERY DAY Patient taking differently: Take 20 mg by mouth every morning. 04/02/19  Yes Yates Decamp, MD    Past Medical History: Past Medical History:  Diagnosis Date   Carotid artery occlusion    Coronary artery disease    Hyperlipidemia    Hypertension    Myocardial infarction (HCC) 1997    Past Surgical History: Past Surgical History:  Procedure Laterality Date   ABDOMINAL HYSTERECTOMY     CARDIAC CATHETERIZATION     LEFT HEART CATH AND CORONARY ANGIOGRAPHY N/A 2020/11/14   Procedure: LEFT HEART CATH AND CORONARY ANGIOGRAPHY;  Surgeon: Elder Negus, MD;  Location: [facility] INVASIVE CV LAB;  Service: Cardiovascular;  Laterality: N/A;    Family History: Family  History  Problem Relation Age of Onset   Heart disease Mother    Hypertension Father    Heart disease Father    Thyroid disease Neg Hx     Social History: Social History   Socioeconomic History   Marital status: Single    Spouse name: Not on file   Number of children: Not on file   Years of education: Not on file   Highest education level: Not on file  Occupational History   Not on file  Tobacco Use   Smoking status: Some Days    Types: Cigarettes   Smokeless tobacco: Never    Tobacco comments:    2 per day   Substance and Sexual Activity   Alcohol use: Never   Drug use: Never   Sexual activity: Not on file  Other Topics Concern   Not on file  Social History Narrative   Not on file   Social Determinants of Health   Financial Resource Strain: Not on file  Food Insecurity: Not on file  Transportation Needs: Not on file  Physical Activity: Not on file  Stress: Not on file  Social Connections: Not on file    Allergies:  Allergies  Allergen Reactions   Penicillins Other (See Comments)    Dizzy     Objective:    Vital Signs:   Temp:  [96.6 F (35.9 C)-98 F (36.7 C)] 96.6 F (35.9 C) (09/20 2000) Pulse Rate:  [50-110] 110 (09/20 2000) Resp:  [0-18] 0 (09/20 2000) BP: (108-112)/(56-67) 112/67 (09/20 0430) SpO2:  [97 %-100 %] 100 % (09/20 2000) Arterial Line BP: (74)/(63) 74/63 (09/20 2000) FiO2 (%):  [40 %-100 %] 40 % (09/20 1732) Last BM Date: 10/23/20  Weight change: Filed Weights   10/11/2020 1600  Weight: 68 kg    Intake/Output:   Intake/Output Summary (Last 24 hours) at 11/02/2020 2017 Last data filed at 10/07/2020 2000 Gross per 24 hour  Intake 8939.69 ml  Output 9697 ml  Net -757.31 ml      Physical Exam    General:  Intubated/sedated.  HEENT: normal Neck: supple. JVP not elevated. Carotids 2+ bilat; no bruits. No lymphadenopathy or thyromegaly appreciated. Cor: Chest wall open.  Lungs: Decreased bilaterally.  Abdomen: soft, nontender, nondistended. No hepatosplenomegaly. No bruits or masses. Good bowel sounds. Extremities: no cyanosis, clubbing, rash, edema Neuro: Sedated    Telemetry   NSR rate 100s (personally reviewed).   Labs   Basic Metabolic Panel: Recent Labs  Lab 10/29/2020 1333 10/22/20 1759 10/23/20 0351 10/24/20 0233 10/23/2020 0305 10/13/2020 0808 10/24/2020 1333 11/02/2020 1407 10/29/2020 1430 10/20/2020 1519 11/03/2020 1523 11/02/2020 1534 10/15/2020 1537 10/08/2020 1545 10/24/2020 1717 10/19/2020 1947  NA  137 136 138 138 136   < > 144   < > 142   < > 144 144 144 145 144 145  K 3.6 3.0* 3.3* 3.7 3.7   < > 3.2*   < > 3.6   < > 3.8 3.3* 3.3* 3.2* 2.7* 2.8*  CL 103 106 106 107 105   < > 104  --  99  --  99 100  --  100  --   --   CO2 23 23 22 22  21*  --   --   --   --   --   --   --   --   --   --   --   GLUCOSE 100* 126* 97 111* 100*   < > 165*  --  176*  --  216* 218*  --  218*  --   --   BUN 16 11 10 14 15    < > 12  --  10  --  9 9  --  9  --   --   CREATININE 1.38* 1.34* 1.33* 1.52* 1.66*   < > 1.00  --  0.70  --  0.80 0.80  --  0.80  --   --   CALCIUM 9.3 8.3* 8.4* 8.1* 8.1*  --   --   --   --   --   --   --   --   --   --   --    < > = values in this interval not displayed.    Liver Function Tests: Recent Labs  Lab Oct 28, 2020 1333  AST 24  ALT 14  ALKPHOS 25*  BILITOT 0.5  PROT 7.3  ALBUMIN 3.9   No results for input(s): LIPASE, AMYLASE in the last 168 hours. No results for input(s): AMMONIA in the last 168 hours.  CBC: Recent Labs  Lab 10/28/2020 1333 10/22/20 0051 10/23/20 0351 10/24/20 0233 10/06/2020 0305 10/18/2020 0808 10/07/2020 1038 10/22/2020 1047 10/07/2020 1537 10/14/2020 1545 10/24/2020 1555 10/24/2020 1717 10/12/2020 1947  WBC 4.4 6.0 5.7 5.3 4.6  --   --   --   --   --  5.7  --   --   NEUTROABS 1.5*  --   --   --   --   --   --   --   --   --   --   --   --   HGB 13.0 12.2 10.9* 10.8* 10.4*   < > 7.7*   < > 9.9* 9.9* 8.6* 8.2* 8.2*  HCT 39.5 37.4 33.8* 32.4* 31.8*   < > 22.7*   < > 29.0* 29.0* 25.2* 24.0* 24.0*  MCV 85.9 85.4 86.2 85.5 85.7  --   --   --   --   --  87.2  --   --   PLT 264 246 234 207 207  --  86*  --   --   --  81*  --   --    < > = values in this interval not displayed.    Cardiac Enzymes: No results for input(s): CKTOTAL, CKMB, CKMBINDEX, TROPONINI in the last 168 hours.  BNP: BNP (last 3 results) Recent Labs    10/22/20 0954  BNP 1,154.2*    ProBNP (last 3 results) No results for input(s): PROBNP in the last 8760 hours.   CBG: Recent Labs   Lab 28-Oct-2020 1717 10/11/2020 1710 10/23/2020 1825 10/14/2020 1948  GLUCAP 76 222* 235* 226*    Coagulation Studies: Recent Labs    10/16/2020 0305 10/18/2020 1555  LABPROT 15.4* 20.6*  INR 1.2 1.8*     Imaging   DG Chest Port 1 View  Result Date: 10/13/2020 CLINICAL DATA:  Status post cardiac surgery. EXAM: PORTABLE CHEST 1 VIEW COMPARISON:  Chest x-ray 28-Oct-2020. FINDINGS: Endotracheal tube tip is approximately 3.6 cm above the carina. Swan-Ganz catheter tip projects over the main pulmonary artery. Enteric tube extends below the diaphragm into the stomach, its distal tip is not included on the image. ECMO catheters are present. Mediastinal drain is present. There are diffuse bilateral lung infiltrates. Costophrenic angles are clear. There is no definite pneumothorax. Artifact likely overlies the right lung apex. The cardiomediastinal silhouette appears stable. There are atherosclerotic calcifications of the aorta. No  acute fractures are seen. IMPRESSION: 1. Diffuse bilateral airspace disease worrisome for edema, hemorrhage or infection. 2. Lines and tubes as above. Electronically Signed   By: Darliss Cheney M.D.   On: 10/06/2020 17:45   DG C-Arm 1-60 Min-No Report  Result Date: 10/15/2020 Fluoroscopy was utilized by the requesting physician.  No radiographic interpretation.   DG C-Arm 1-60 Min-No Report  Result Date: 10/19/2020 Fluoroscopy was utilized by the requesting physician.  No radiographic interpretation.     Medications:     Current Medications:  sodium chloride   Intravenous Once   sodium chloride   Intravenous Once   sodium chloride   Intravenous Once   sodium chloride   Intravenous Once   [START ON 10/26/2020] acetaminophen  1,000 mg Oral Q6H   Or   [START ON 10/26/2020] acetaminophen (TYLENOL) oral liquid 160 mg/5 mL  1,000 mg Per Tube Q6H   acetaminophen (TYLENOL) oral liquid 160 mg/5 mL  650 mg Per Tube Once   Or   acetaminophen  650 mg Rectal Once   [START ON  10/26/2020] aspirin EC  325 mg Oral Daily   Or   [START ON 10/26/2020] aspirin  324 mg Per Tube Daily   [START ON 10/26/2020] bisacodyl  10 mg Oral Daily   Or   [START ON 10/26/2020] bisacodyl  10 mg Rectal Daily   calcium chloride  1 g Intravenous Once   chlorhexidine  15 mL Mouth/Throat NOW   Chlorhexidine Gluconate Cloth  6 each Topical Daily   docusate  100 mg Per Tube BID    HYDROmorphone (DILAUDID) injection  0.5 mg Intravenous Once   [START ON 10/26/2020] pantoprazole (PROTONIX) IV  40 mg Intravenous Q24H   polyethylene glycol  17 g Per Tube Daily   rosuvastatin  40 mg Oral Daily   [START ON 10/26/2020] sodium chloride flush  3 mL Intravenous Q12H    Infusions:  sodium chloride     [START ON 10/26/2020] sodium chloride     sodium chloride     sodium chloride     sodium chloride     albumin human 12.5 g (10/17/2020 1809)   cefUROXime (ZINACEF)  IV     dexmedetomidine (PRECEDEX) IV infusion     DOPamine     epinephrine     HYDROmorphone 1.5 mg/hr (10/10/2020 1807)   insulin     lactated ringers     lactated ringers     lactated ringers     magnesium sulfate 4 g (10/22/2020 1859)   midazolam 2 mg/hr (10/19/2020 1723)   milrinone     nitroGLYCERIN     norepinephrine (LEVOPHED) Adult infusion 15 mcg/min (10/18/2020 1754)   phenylephrine (NEO-SYNEPHRINE) Adult infusion     vancomycin     vasopressin 0.03 Units/min (10/19/2020 1719)     Assessment/Plan   1. Shock: Cardiogenic/vasoplegic post-CABG with acute lung injury. Central cannulation for [facility], ascending aorta and RA.  Impella 5.5 for venting (ascending aorta). Goal to eventually wean ECMO to Impella support alone.  Currently on milrinone 0.375, NE 15, vasopressin 0.03, epinephrine 7.  - Titrate pressors for MAP >65.  - Continue milrinone and follow SVO2.  - ECMO flow stable currently, dependent on volume (receiving PRBCs).  Will aim to keep flow 2.5-3 L/min, will need to support intravascular volume with blood products  initially.  - Impella at P2, flow 0.5 L/min.  Position assessed under echo at bedside and stable.  - Need heparin for ECMO circuit and Impella.  Will initiate around midnight with low dose heparin, goal heparin level 0.3 if chest tube output minimal.  2. Anemia/coagulopathy: Multiple units PRBCs, FFP, platelets.  Chest washout at bedside.  - Frequent CBCs and product transfusion as needed.  3. Acute hypoxemic respiratory failure: ALI with pulmonary edema and pulmonary hemorrhage.   - Lung protective ventilation per CCM.  - Will eventually need diuresis/fluid removal.  4. Acute on chronic systolic CHF: Ischemic cardiomyopathy.  Pre-op echo with EF 35-40%.  Intra-op echo with LV EF 25% range but good RV function.  See above.   5. Renal: Repeat BMET, anticipate AKI.  There is a good chance that she will need CVVH within the next 48 hrs for volume management.  6. Thrombocytopenia: Due to coagulopathy.    Length of Stay: 4  Marca Ancona, MD  10/08/2020, 8:17 PM  Advanced Heart Failure Team Pager [phone] (M-F; 7a - 5p)  Please contact [facility] for night-coverage after hours (4p -7a ) and weekends on [email or web address]

## 2020-10-25 NOTE — Anesthesia Procedure Notes (Signed)
Central Venous Catheter Insertion Performed by: Leonides Grills, MD, anesthesiologist Start/End09/16/2022 7:00 AM, 10/25/2020 7:15 AM Patient location: Pre-op. Preanesthetic checklist: patient identified, IV checked, site marked, risks and benefits discussed, surgical consent, monitors and equipment checked, pre-op evaluation, timeout performed and anesthesia consent Position: Trendelenburg Lidocaine 1% used for infiltration and patient sedated Hand hygiene performed , maximum sterile barriers used  and Seldinger technique used Catheter size: 8.5 Fr Total catheter length 10. PA cath and Central line was placed.Sheath introducer PA Cath depth:42 Procedure performed using ultrasound guided technique. Ultrasound Notes:anatomy identified, needle tip was noted to be adjacent to the nerve/plexus identified, no ultrasound evidence of intravascular and/or intraneural injection and image(s) printed for medical record Attempts: 1 Following insertion, line sutured, dressing applied and Biopatch. Post procedure assessment: blood return through all ports, free fluid flow and no air  Patient tolerated the procedure well with no immediate complications.

## 2020-10-25 NOTE — Progress Notes (Signed)
EVENING ROUNDS NOTE :     [address]       [place] [address]             [phone]                 Day of Surgery Procedure(s) (LRB): CORONARY ARTERY BYPASS GRAFTING (CABG) TIMES FOUR, ON PUMP, USING LEFT INTERNAL MAMMARY ARTERY AND ENDOSCOPICALLY HARVESTED RIGHT GREATER SAPHENOUS VEIN (N/A) TRANSESOPHAGEAL ECHOCARDIOGRAM (TEE) (N/A) ENDOVEIN HARVEST OF GREATER SAPHENOUS VEIN (Right) APPLICATION OF CELL SAVER PLACEMENT OF IMPELLA LEFT VENTRICULAR ASSIST DEVICE CANNULATION FOR ECMO (EXTRACORPOREAL MEMBRANE OXYGENATION)   Total Length of Stay:  LOS: 4 days  Events:   S/p washout Will continue to watch output Will f/u on labs    BP 112/67 (BP Location: Right Arm)   Pulse (!) 50   Temp 97.8 F (36.6 C) (Oral)   Resp 16   Ht 5\' 7"  (1.702 m)   Wt 68 kg   SpO2 100%   BMI 23.49 kg/m   PAP: (24-37)/(2-23) 37/23  Vent Mode: PCV FiO2 (%):  [40 %-100 %] 40 % Set Rate:  [16 bmp] 16 bmp PEEP:  [5 cmH20-10 cmH20] 5 cmH20 Plateau Pressure:  [19 cmH20] 19 cmH20   sodium chloride     [START ON 10/26/2020] sodium chloride     sodium chloride     sodium chloride     sodium chloride     albumin human 12.5 g (10/07/2020 1809)   dexmedetomidine (PRECEDEX) IV infusion     DOPamine     epinephrine     HYDROmorphone 1.5 mg/hr (11/04/2020 1807)   insulin     lactated ringers     lactated ringers     lactated ringers     magnesium sulfate 4 g (10/15/2020 1859)   midazolam 2 mg/hr (10/24/2020 1723)   milrinone     nitroGLYCERIN     norepinephrine (LEVOPHED) Adult infusion 10 mcg/min (10/13/2020 1722)   phenylephrine (NEO-SYNEPHRINE) Adult infusion     potassium chloride 10 mEq (10/10/2020 1849)   vancomycin     vasopressin 0.03 Units/min (10/30/2020 1719)    I/O last 3 completed shifts: In: 7958 [I.V.:4200; Blood:3501; Other:7; IV Piggyback:250] Out: 7959; Blood:3072]   CBC Latest Ref Rng & Units 10/28/2020 10/18/2020 10/24/2020  WBC 4.0 - 10.5 K/uL 5.7  - -  Hemoglobin 12.0 - 15.0 g/dL 10/15/2020) 7.1(I) 9.6(V)  Hematocrit 36.0 - 46.0 % 25.2(L) 29.0(L) 29.0(L)  Platelets 150 - 400 K/uL 81(L) - -    BMP Latest Ref Rng & Units 10/06/2020 10/30/2020 10/29/2020  Glucose 70 - 99 mg/dL 10/25/2020) - 810(F)  BUN 8 - 23 mg/dL 9 - 9  Creatinine 751(W - 1.00 mg/dL 2.58 - 5.27  BUN/Creat Ratio 12 - 28 - - -  Sodium 135 - 145 mmol/L 145 144 144  Potassium 3.5 - 5.1 mmol/L 3.2(L) 3.3(L) 3.3(L)  Chloride 98 - 111 mmol/L 100 - 100  CO2 22 - 32 mmol/L - - -  Calcium 8.9 - 10.3 mg/dL - - -    ABG    Component Value Date/Time   PHART 7.429 11/02/2020 1537   PCO2ART 31.3 (L) 10/23/2020 1537   PO2ART 433 (H) 10/21/2020 1537   HCO3 20.7 10/17/2020 1537   TCO2 22 10/08/2020 1545   ACIDBASEDEF 3.0 (H) 10/20/2020 1537   O2SAT 100.0 10/10/2020 1537       10/31/2020, MD 10/29/2020 7:24 PM

## 2020-10-26 ENCOUNTER — Encounter (HOSPITAL_COMMUNITY): Payer: Self-pay | Admitting: Anesthesiology

## 2020-10-26 ENCOUNTER — Inpatient Hospital Stay (HOSPITAL_COMMUNITY): Payer: Medicare Other

## 2020-10-26 ENCOUNTER — Encounter (HOSPITAL_COMMUNITY): Payer: Self-pay | Admitting: Thoracic Surgery (Cardiothoracic Vascular Surgery)

## 2020-10-26 DIAGNOSIS — Z9281 Personal history of extracorporeal membrane oxygenation (ECMO): Secondary | ICD-10-CM

## 2020-10-26 DIAGNOSIS — Z951 Presence of aortocoronary bypass graft: Secondary | ICD-10-CM | POA: Diagnosis not present

## 2020-10-26 DIAGNOSIS — R570 Cardiogenic shock: Secondary | ICD-10-CM | POA: Diagnosis not present

## 2020-10-26 LAB — BPAM FFP
Blood Product Expiration Date: 202209222359
Blood Product Expiration Date: 202209222359
Blood Product Expiration Date: 202209222359
Blood Product Expiration Date: 202209222359
Blood Product Expiration Date: 202209222359
Blood Product Expiration Date: 202209222359
Blood Product Expiration Date: 202209252359
Blood Product Expiration Date: 202209252359
Blood Product Expiration Date: 202209252359
Blood Product Expiration Date: 202209252359
Blood Product Expiration Date: 202209252359
Blood Product Expiration Date: 202209252359
Blood Product Expiration Date: 202209252359
Blood Product Expiration Date: 202209252359
Blood Product Expiration Date: 202209252359
Blood Product Expiration Date: 202209252359
Blood Product Expiration Date: 202209252359
Blood Product Expiration Date: 202209252359
ISSUE DATE / TIME: 202209201332
ISSUE DATE / TIME: 202209201332
ISSUE DATE / TIME: 202209201509
ISSUE DATE / TIME: 202209201509
ISSUE DATE / TIME: 202209201509
ISSUE DATE / TIME: 202209201509
ISSUE DATE / TIME: 202209201831
ISSUE DATE / TIME: 202209201831
ISSUE DATE / TIME: 202209201831
ISSUE DATE / TIME: 202209201831
ISSUE DATE / TIME: 202209202136
ISSUE DATE / TIME: 202209202136
ISSUE DATE / TIME: 202209202136
ISSUE DATE / TIME: 202209202136
ISSUE DATE / TIME: 202209202329
ISSUE DATE / TIME: 202209202329
ISSUE DATE / TIME: 202209202329
ISSUE DATE / TIME: 202209202329
Unit Type and Rh: 6200
Unit Type and Rh: 6200
Unit Type and Rh: 600
Unit Type and Rh: 6200
Unit Type and Rh: 6200
Unit Type and Rh: 6200
Unit Type and Rh: 6200
Unit Type and Rh: 6200
Unit Type and Rh: 6200
Unit Type and Rh: 6200
Unit Type and Rh: 600
Unit Type and Rh: 6200
Unit Type and Rh: 6200
Unit Type and Rh: 6200
Unit Type and Rh: 6200
Unit Type and Rh: 6200
Unit Type and Rh: 6200
Unit Type and Rh: 6200

## 2020-10-26 LAB — POCT I-STAT 7, (LYTES, BLD GAS, ICA,H+H)
Acid-Base Excess: 3 mmol/L — ABNORMAL HIGH (ref 0.0–2.0)
Acid-Base Excess: 4 mmol/L — ABNORMAL HIGH (ref 0.0–2.0)
Acid-Base Excess: 4 mmol/L — ABNORMAL HIGH (ref 0.0–2.0)
Acid-Base Excess: 2 mmol/L (ref 0.0–2.0)
Acid-Base Excess: 2 mmol/L (ref 0.0–2.0)
Acid-Base Excess: 1 mmol/L (ref 0.0–2.0)
Acid-base deficit: 4 mmol/L — ABNORMAL HIGH (ref 0.0–2.0)
Bicarbonate: 21.4 mmol/L (ref 20.0–28.0)
Bicarbonate: 27.4 mmol/L (ref 20.0–28.0)
Bicarbonate: 28.9 mmol/L — ABNORMAL HIGH (ref 20.0–28.0)
Bicarbonate: 29.5 mmol/L — ABNORMAL HIGH (ref 20.0–28.0)
Bicarbonate: 27.7 mmol/L (ref 20.0–28.0)
Bicarbonate: 28.1 mmol/L — ABNORMAL HIGH (ref 20.0–28.0)
Bicarbonate: 24.9 mmol/L (ref 20.0–28.0)
Calcium, Ion: 0.96 mmol/L — ABNORMAL LOW (ref 1.15–1.40)
Calcium, Ion: 0.93 mmol/L — ABNORMAL LOW (ref 1.15–1.40)
Calcium, Ion: 0.94 mmol/L — ABNORMAL LOW (ref 1.15–1.40)
Calcium, Ion: 0.94 mmol/L — ABNORMAL LOW (ref 1.15–1.40)
Calcium, Ion: 0.98 mmol/L — ABNORMAL LOW (ref 1.15–1.40)
Calcium, Ion: 0.97 mmol/L — ABNORMAL LOW (ref 1.15–1.40)
Calcium, Ion: 0.84 mmol/L — CL (ref 1.15–1.40)
HCT: 16 % — ABNORMAL LOW (ref 36.0–46.0)
HCT: 18 % — ABNORMAL LOW (ref 36.0–46.0)
HCT: 18 % — ABNORMAL LOW (ref 36.0–46.0)
HCT: 19 % — ABNORMAL LOW (ref 36.0–46.0)
HCT: 21 % — ABNORMAL LOW (ref 36.0–46.0)
HCT: 25 % — ABNORMAL LOW (ref 36.0–46.0)
HCT: 22 % — ABNORMAL LOW (ref 36.0–46.0)
Hemoglobin: 5.4 g/dL — CL (ref 12.0–15.0)
Hemoglobin: 6.1 g/dL — CL (ref 12.0–15.0)
Hemoglobin: 6.1 g/dL — CL (ref 12.0–15.0)
Hemoglobin: 6.5 g/dL — CL (ref 12.0–15.0)
Hemoglobin: 7.1 g/dL — ABNORMAL LOW (ref 12.0–15.0)
Hemoglobin: 8.5 g/dL — ABNORMAL LOW (ref 12.0–15.0)
Hemoglobin: 7.5 g/dL — ABNORMAL LOW (ref 12.0–15.0)
O2 Saturation: 100 %
O2 Saturation: 100 %
O2 Saturation: 100 %
O2 Saturation: 100 %
O2 Saturation: 100 %
O2 Saturation: 100 %
O2 Saturation: 100 %
Patient temperature: 36.3
Patient temperature: 36
Patient temperature: 36.3
Patient temperature: 36.4
Patient temperature: 36.5
Patient temperature: 36.5
Patient temperature: 36.4
Potassium: 2.9 mmol/L — ABNORMAL LOW (ref 3.5–5.1)
Potassium: 3.3 mmol/L — ABNORMAL LOW (ref 3.5–5.1)
Potassium: 3.6 mmol/L (ref 3.5–5.1)
Potassium: 3.9 mmol/L (ref 3.5–5.1)
Potassium: 4 mmol/L (ref 3.5–5.1)
Potassium: 4.8 mmol/L (ref 3.5–5.1)
Potassium: 4.6 mmol/L (ref 3.5–5.1)
Sodium: 145 mmol/L (ref 135–145)
Sodium: 146 mmol/L — ABNORMAL HIGH (ref 135–145)
Sodium: 145 mmol/L (ref 135–145)
Sodium: 145 mmol/L (ref 135–145)
Sodium: 144 mmol/L (ref 135–145)
Sodium: 143 mmol/L (ref 135–145)
Sodium: 144 mmol/L (ref 135–145)
TCO2: 23 mmol/L (ref 22–32)
TCO2: 29 mmol/L (ref 22–32)
TCO2: 30 mmol/L (ref 22–32)
TCO2: 31 mmol/L (ref 22–32)
TCO2: 29 mmol/L (ref 22–32)
TCO2: 30 mmol/L (ref 22–32)
TCO2: 26 mmol/L (ref 22–32)
pCO2 arterial: 37.5 mmHg (ref 32.0–48.0)
pCO2 arterial: 39.1 mmHg (ref 32.0–48.0)
pCO2 arterial: 46.4 mmHg (ref 32.0–48.0)
pCO2 arterial: 48.3 mmHg — ABNORMAL HIGH (ref 32.0–48.0)
pCO2 arterial: 45.2 mmHg (ref 32.0–48.0)
pCO2 arterial: 46.8 mmHg (ref 32.0–48.0)
pCO2 arterial: 36.4 mmHg (ref 32.0–48.0)
pH, Arterial: 7.361 (ref 7.350–7.450)
pH, Arterial: 7.449 (ref 7.350–7.450)
pH, Arterial: 7.4 (ref 7.350–7.450)
pH, Arterial: 7.392 (ref 7.350–7.450)
pH, Arterial: 7.393 (ref 7.350–7.450)
pH, Arterial: 7.384 (ref 7.350–7.450)
pH, Arterial: 7.441 (ref 7.350–7.450)
pO2, Arterial: 395 mmHg — ABNORMAL HIGH (ref 83.0–108.0)
pO2, Arterial: 497 mmHg — ABNORMAL HIGH (ref 83.0–108.0)
pO2, Arterial: 520 mmHg — ABNORMAL HIGH (ref 83.0–108.0)
pO2, Arterial: 522 mmHg — ABNORMAL HIGH (ref 83.0–108.0)
pO2, Arterial: 388 mmHg — ABNORMAL HIGH (ref 83.0–108.0)
pO2, Arterial: 487 mmHg — ABNORMAL HIGH (ref 83.0–108.0)
pO2, Arterial: 395 mmHg — ABNORMAL HIGH (ref 83.0–108.0)

## 2020-10-26 LAB — PREPARE FRESH FROZEN PLASMA
Unit division: 0
Unit division: 0
Unit division: 0
Unit division: 0
Unit division: 0
Unit division: 0
Unit division: 0
Unit division: 0
Unit division: 0
Unit division: 0
Unit division: 0
Unit division: 0
Unit division: 0
Unit division: 0
Unit division: 0

## 2020-10-26 LAB — PREPARE PLATELET PHERESIS
Unit division: 0
Unit division: 0
Unit division: 0
Unit division: 0
Unit division: 0

## 2020-10-26 LAB — BASIC METABOLIC PANEL
Anion gap: 15 (ref 5–15)
Anion gap: 11 (ref 5–15)
Anion gap: 9 (ref 5–15)
Anion gap: 9 (ref 5–15)
BUN: 10 mg/dL (ref 8–23)
BUN: 10 mg/dL (ref 8–23)
BUN: 10 mg/dL (ref 8–23)
BUN: 10 mg/dL (ref 8–23)
CO2: 23 mmol/L (ref 22–32)
CO2: 24 mmol/L (ref 22–32)
CO2: 24 mmol/L (ref 22–32)
CO2: 24 mmol/L (ref 22–32)
Calcium: 7.1 mg/dL — ABNORMAL LOW (ref 8.9–10.3)
Calcium: 6.6 mg/dL — ABNORMAL LOW (ref 8.9–10.3)
Calcium: 6.6 mg/dL — ABNORMAL LOW (ref 8.9–10.3)
Calcium: 6.7 mg/dL — ABNORMAL LOW (ref 8.9–10.3)
Chloride: 106 mmol/L (ref 98–111)
Chloride: 107 mmol/L (ref 98–111)
Chloride: 108 mmol/L (ref 98–111)
Chloride: 108 mmol/L (ref 98–111)
Creatinine, Ser: 1.39 mg/dL — ABNORMAL HIGH (ref 0.44–1.00)
Creatinine, Ser: 1.2 mg/dL — ABNORMAL HIGH (ref 0.44–1.00)
Creatinine, Ser: 1.19 mg/dL — ABNORMAL HIGH (ref 0.44–1.00)
Creatinine, Ser: 1.29 mg/dL — ABNORMAL HIGH (ref 0.44–1.00)
GFR, Estimated: 39 mL/min — ABNORMAL LOW (ref >60)
GFR, Estimated: 47 mL/min — ABNORMAL LOW (ref >60)
GFR, Estimated: 47 mL/min — ABNORMAL LOW (ref >60)
GFR, Estimated: 43 mL/min — ABNORMAL LOW (ref >60)
Glucose, Bld: 164 mg/dL — ABNORMAL HIGH (ref 70–99)
Glucose, Bld: 112 mg/dL — ABNORMAL HIGH (ref 70–99)
Glucose, Bld: 136 mg/dL — ABNORMAL HIGH (ref 70–99)
Glucose, Bld: 182 mg/dL — ABNORMAL HIGH (ref 70–99)
Potassium: 3.3 mmol/L — ABNORMAL LOW (ref 3.5–5.1)
Potassium: 3.8 mmol/L (ref 3.5–5.1)
Potassium: 4 mmol/L (ref 3.5–5.1)
Potassium: 5 mmol/L (ref 3.5–5.1)
Sodium: 144 mmol/L (ref 135–145)
Sodium: 142 mmol/L (ref 135–145)
Sodium: 141 mmol/L (ref 135–145)
Sodium: 141 mmol/L (ref 135–145)

## 2020-10-26 LAB — CBC WITH DIFFERENTIAL/PLATELET
Abs Immature Granulocytes: 0.01 10*3/uL (ref 0.00–0.07)
Basophils Absolute: 0 10*3/uL (ref 0.0–0.1)
Basophils Relative: 0 %
Eosinophils Absolute: 0 10*3/uL (ref 0.0–0.5)
Eosinophils Relative: 0 %
HCT: 22.4 % — ABNORMAL LOW (ref 36.0–46.0)
Hemoglobin: 7.3 g/dL — ABNORMAL LOW (ref 12.0–15.0)
Immature Granulocytes: 0 %
Lymphocytes Relative: 16 %
Lymphs Abs: 0.4 10*3/uL — ABNORMAL LOW (ref 0.7–4.0)
MCH: 26.9 pg (ref 26.0–34.0)
MCHC: 32.6 g/dL (ref 30.0–36.0)
MCV: 82.7 fL (ref 80.0–100.0)
Monocytes Absolute: 0.2 10*3/uL (ref 0.1–1.0)
Monocytes Relative: 8 %
Neutro Abs: 2.1 10*3/uL (ref 1.7–7.7)
Neutrophils Relative %: 76 %
Platelets: 19 10*3/uL — CL (ref 150–400)
RBC: 2.71 MIL/uL — ABNORMAL LOW (ref 3.87–5.11)
RDW: 20.5 % — ABNORMAL HIGH (ref 11.5–15.5)
WBC: 2.7 10*3/uL — ABNORMAL LOW (ref 4.0–10.5)
nRBC: 0 % (ref 0.0–0.2)

## 2020-10-26 LAB — BPAM PLATELET PHERESIS
Blood Product Expiration Date: 202209212359
Blood Product Expiration Date: 202209232359
Blood Product Expiration Date: 202209232359
Blood Product Expiration Date: 202209202359
Blood Product Expiration Date: 202209222359
ISSUE DATE / TIME: 202209201116
ISSUE DATE / TIME: 202209202338
ISSUE DATE / TIME: 202209202338
ISSUE DATE / TIME: 202209201503
ISSUE DATE / TIME: 202209202313
Unit Type and Rh: 5100
Unit Type and Rh: 5100
Unit Type and Rh: 6200
Unit Type and Rh: 6200
Unit Type and Rh: 6200

## 2020-10-26 LAB — GLOBAL TEG PANEL
CFF Max Amplitude: 13.9 mm — ABNORMAL LOW (ref 15–32)
CFF Max Amplitude: 13.2 mm — ABNORMAL LOW (ref 15–32)
CK with Heparinase (R): 8.1 min (ref 4.3–8.3)
CK with Heparinase (R): 10 min — ABNORMAL HIGH (ref 4.3–8.3)
Citrated Functional Fibrinogen: 253.6 mg/dL — ABNORMAL LOW (ref 278–581)
Citrated Functional Fibrinogen: 240.9 mg/dL — ABNORMAL LOW (ref 278–581)
Citrated Kaolin (K): 4.3 min — ABNORMAL HIGH (ref 0.8–2.1)
Citrated Kaolin (K): 3.3 min — ABNORMAL HIGH (ref 0.8–2.1)
Citrated Kaolin (MA): 43.6 mm — ABNORMAL LOW (ref 52–69)
Citrated Kaolin (MA): 43.5 mm — ABNORMAL LOW (ref 52–69)
Citrated Kaolin (R): >17 min — ABNORMAL HIGH (ref 4.6–9.1)
Citrated Kaolin (R): >17 min — ABNORMAL HIGH (ref 4.6–9.1)
Citrated Kaolin Angle: 48.8 deg — ABNORMAL LOW (ref 63–78)
Citrated Kaolin Angle: 56.1 deg — ABNORMAL LOW (ref 63–78)
Citrated Rapid TEG (MA): 43 mm — ABNORMAL LOW (ref 52–70)
Citrated Rapid TEG (MA): 44.2 mm — ABNORMAL LOW (ref 52–70)

## 2020-10-26 LAB — CBC
HCT: 20.5 % — ABNORMAL LOW (ref 36.0–46.0)
HCT: 23.9 % — ABNORMAL LOW (ref 36.0–46.0)
HCT: 21.4 % — ABNORMAL LOW (ref 36.0–46.0)
HCT: 27.7 % — ABNORMAL LOW (ref 36.0–46.0)
Hemoglobin: 7.1 g/dL — ABNORMAL LOW (ref 12.0–15.0)
Hemoglobin: 8.2 g/dL — ABNORMAL LOW (ref 12.0–15.0)
Hemoglobin: 7.4 g/dL — ABNORMAL LOW (ref 12.0–15.0)
Hemoglobin: 9.9 g/dL — ABNORMAL LOW (ref 12.0–15.0)
MCH: 29.1 pg (ref 26.0–34.0)
MCH: 28.7 pg (ref 26.0–34.0)
MCH: 28.7 pg (ref 26.0–34.0)
MCH: 29.7 pg (ref 26.0–34.0)
MCHC: 34.6 g/dL (ref 30.0–36.0)
MCHC: 34.3 g/dL (ref 30.0–36.0)
MCHC: 34.6 g/dL (ref 30.0–36.0)
MCHC: 35.7 g/dL (ref 30.0–36.0)
MCV: 84 fL (ref 80.0–100.0)
MCV: 83.6 fL (ref 80.0–100.0)
MCV: 82.9 fL (ref 80.0–100.0)
MCV: 83.2 fL (ref 80.0–100.0)
Platelets: 38 10*3/uL — ABNORMAL LOW (ref 150–400)
Platelets: 71 10*3/uL — ABNORMAL LOW (ref 150–400)
Platelets: 88 10*3/uL — ABNORMAL LOW (ref 150–400)
Platelets: 61 10*3/uL — ABNORMAL LOW (ref 150–400)
RBC: 2.44 MIL/uL — ABNORMAL LOW (ref 3.87–5.11)
RBC: 2.86 MIL/uL — ABNORMAL LOW (ref 3.87–5.11)
RBC: 2.58 MIL/uL — ABNORMAL LOW (ref 3.87–5.11)
RBC: 3.33 MIL/uL — ABNORMAL LOW (ref 3.87–5.11)
RDW: 17.7 % — ABNORMAL HIGH (ref 11.5–15.5)
RDW: 16.5 % — ABNORMAL HIGH (ref 11.5–15.5)
RDW: 15.1 % (ref 11.5–15.5)
RDW: 14.5 % (ref 11.5–15.5)
WBC: 2.1 10*3/uL — ABNORMAL LOW (ref 4.0–10.5)
WBC: 3.5 10*3/uL — ABNORMAL LOW (ref 4.0–10.5)
WBC: 3.2 10*3/uL — ABNORMAL LOW (ref 4.0–10.5)
WBC: 4.1 10*3/uL (ref 4.0–10.5)
nRBC: 1 % — ABNORMAL HIGH (ref 0.0–0.2)
nRBC: 0 % (ref 0.0–0.2)
nRBC: 0.6 % — ABNORMAL HIGH (ref 0.0–0.2)
nRBC: 0.7 % — ABNORMAL HIGH (ref 0.0–0.2)

## 2020-10-26 LAB — GLUCOSE, CAPILLARY
Glucose-Capillary: 205 mg/dL — ABNORMAL HIGH (ref 70–99)
Glucose-Capillary: 238 mg/dL — ABNORMAL HIGH (ref 70–99)
Glucose-Capillary: 204 mg/dL — ABNORMAL HIGH (ref 70–99)
Glucose-Capillary: 187 mg/dL — ABNORMAL HIGH (ref 70–99)
Glucose-Capillary: 149 mg/dL — ABNORMAL HIGH (ref 70–99)
Glucose-Capillary: 129 mg/dL — ABNORMAL HIGH (ref 70–99)
Glucose-Capillary: 131 mg/dL — ABNORMAL HIGH (ref 70–99)
Glucose-Capillary: 111 mg/dL — ABNORMAL HIGH (ref 70–99)
Glucose-Capillary: 90 mg/dL (ref 70–99)
Glucose-Capillary: 81 mg/dL (ref 70–99)
Glucose-Capillary: 114 mg/dL — ABNORMAL HIGH (ref 70–99)
Glucose-Capillary: 153 mg/dL — ABNORMAL HIGH (ref 70–99)
Glucose-Capillary: 177 mg/dL — ABNORMAL HIGH (ref 70–99)

## 2020-10-26 LAB — APTT
aPTT: 66 seconds — ABNORMAL HIGH (ref 24–36)
aPTT: 72 seconds — ABNORMAL HIGH (ref 24–36)
aPTT: 81 seconds — ABNORMAL HIGH (ref 24–36)
aPTT: 73 seconds — ABNORMAL HIGH (ref 24–36)

## 2020-10-26 LAB — BPAM CRYOPRECIPITATE
Blood Product Expiration Date: 202209202309
ISSUE DATE / TIME: 202209201735
Unit Type and Rh: 6200

## 2020-10-26 LAB — PREPARE RBC (CROSSMATCH)

## 2020-10-26 LAB — HEPATIC FUNCTION PANEL
ALT: 22 U/L (ref 0–44)
AST: 63 U/L — ABNORMAL HIGH (ref 15–41)
Albumin: 2.2 g/dL — ABNORMAL LOW (ref 3.5–5.0)
Alkaline Phosphatase: 25 U/L — ABNORMAL LOW (ref 38–126)
Bilirubin, Direct: 0.3 mg/dL — ABNORMAL HIGH (ref 0.0–0.2)
Indirect Bilirubin: 0.8 mg/dL (ref 0.3–0.9)
Total Bilirubin: 1.1 mg/dL (ref 0.3–1.2)
Total Protein: 3.7 g/dL — ABNORMAL LOW (ref 6.5–8.1)

## 2020-10-26 LAB — COOXEMETRY PANEL
Carboxyhemoglobin: 2.3 % — ABNORMAL HIGH (ref 0.5–1.5)
Methemoglobin: 1.2 % (ref 0.0–1.5)
O2 Saturation: 85.5 %
Total hemoglobin: 7.2 g/dL — ABNORMAL LOW (ref 12.0–16.0)

## 2020-10-26 LAB — FIBRINOGEN
Fibrinogen: 189 mg/dL — ABNORMAL LOW (ref 210–475)
Fibrinogen: 177 mg/dL — ABNORMAL LOW (ref 210–475)
Fibrinogen: 168 mg/dL — ABNORMAL LOW (ref 210–475)
Fibrinogen: 247 mg/dL (ref 210–475)

## 2020-10-26 LAB — PROTIME-INR
INR: 1.4 — ABNORMAL HIGH (ref 0.8–1.2)
INR: 1.6 — ABNORMAL HIGH (ref 0.8–1.2)
INR: 1.9 — ABNORMAL HIGH (ref 0.8–1.2)
INR: 1.8 — ABNORMAL HIGH (ref 0.8–1.2)
Prothrombin Time: 17.5 seconds — ABNORMAL HIGH (ref 11.4–15.2)
Prothrombin Time: 19 seconds — ABNORMAL HIGH (ref 11.4–15.2)
Prothrombin Time: 21.6 seconds — ABNORMAL HIGH (ref 11.4–15.2)
Prothrombin Time: 20.6 seconds — ABNORMAL HIGH (ref 11.4–15.2)

## 2020-10-26 LAB — PREPARE CRYOPRECIPITATE: Unit division: 0

## 2020-10-26 LAB — LACTIC ACID, PLASMA: Lactic Acid, Venous: 6.2 mmol/L (ref 0.5–1.9)

## 2020-10-26 LAB — MAGNESIUM: Magnesium: 1.7 mg/dL (ref 1.7–2.4)

## 2020-10-26 LAB — HEPARIN LEVEL (UNFRACTIONATED): Heparin Unfractionated: <0.1 IU/mL — ABNORMAL LOW (ref 0.30–0.70)

## 2020-10-26 LAB — LACTATE DEHYDROGENASE: LDH: 134 U/L (ref 98–192)

## 2020-10-26 MED ORDER — SODIUM CHLORIDE 0.9% IV SOLUTION: Freq: Once | INTRAVENOUS | Status: AC

## 2020-10-26 MED ORDER — SODIUM CHLORIDE 0.9% IV SOLUTION: Freq: Once | INTRAVENOUS | Status: DC

## 2020-10-26 MED ORDER — AMIODARONE HCL IN DEXTROSE 360-4.14 MG/200ML-% IV SOLN
60.0000 mg/h | INTRAVENOUS | Status: DC
  Administered 2020-10-26 – 2020-10-27 (×4): 60 mg/h via INTRAVENOUS
  Filled 2020-10-26 (×2): qty 200
  Filled 2020-10-26: qty 400

## 2020-10-26 MED ORDER — ORAL CARE MOUTH RINSE
15.0000 mL | OROMUCOSAL | Status: DC
  Administered 2020-10-26 – 2020-11-01 (×62): 15 mL via OROMUCOSAL

## 2020-10-26 MED ORDER — ALBUMIN HUMAN 5 % IV SOLN
250.0000 mL | INTRAVENOUS | Status: AC | PRN
Start: 2020-10-26 — End: 2020-10-27
  Administered 2020-10-26 – 2020-10-27 (×5): 12.5 g via INTRAVENOUS
  Filled 2020-10-26 (×4): qty 250

## 2020-10-26 MED ORDER — ALBUMIN HUMAN 5 % IV SOLN
INTRAVENOUS | Status: AC
  Administered 2020-10-26: 12.5 g
  Filled 2020-10-26: qty 750

## 2020-10-26 MED ORDER — POTASSIUM CHLORIDE 10 MEQ/50ML IV SOLN
10.0000 meq | INTRAVENOUS | Status: AC
  Administered 2020-10-26 (×5): 10 meq via INTRAVENOUS
  Filled 2020-10-26 (×6): qty 50

## 2020-10-26 MED ORDER — SODIUM CHLORIDE 0.9 % IV SOLN: INTRAVENOUS | Status: DC

## 2020-10-26 MED ORDER — SODIUM CHLORIDE 0.9% FLUSH: 10.0000 mL | INTRAVENOUS | Status: DC | PRN

## 2020-10-26 MED ORDER — INSULIN ASPART 100 UNIT/ML IJ SOLN
0.0000 [IU] | INTRAMUSCULAR | Status: DC
Start: 2020-10-26 — End: 2020-11-02
  Administered 2020-10-26: 4 [IU] via SUBCUTANEOUS
  Administered 2020-10-26 – 2020-10-28 (×7): 2 [IU] via SUBCUTANEOUS
  Administered 2020-10-29 (×2): 4 [IU] via SUBCUTANEOUS
  Administered 2020-10-29 (×2): 2 [IU] via SUBCUTANEOUS
  Administered 2020-10-29 – 2020-10-30 (×4): 4 [IU] via SUBCUTANEOUS
  Administered 2020-10-30: 8 [IU] via SUBCUTANEOUS
  Administered 2020-10-30: 2 [IU] via SUBCUTANEOUS
  Administered 2020-10-30 (×2): 4 [IU] via SUBCUTANEOUS
  Administered 2020-10-31 (×4): 2 [IU] via SUBCUTANEOUS
  Administered 2020-11-01: 4 [IU] via SUBCUTANEOUS
  Administered 2020-11-01 (×3): 2 [IU] via SUBCUTANEOUS

## 2020-10-26 MED ORDER — MAGNESIUM SULFATE 4 GM/100ML IV SOLN
4.0000 g | Freq: Once | INTRAVENOUS | Status: AC
  Administered 2020-10-26: 4 g via INTRAVENOUS
  Filled 2020-10-26: qty 100

## 2020-10-26 MED ORDER — AMIODARONE HCL IN DEXTROSE 360-4.14 MG/200ML-% IV SOLN
INTRAVENOUS | Status: AC
  Filled 2020-10-26: qty 200

## 2020-10-26 MED ORDER — CALCIUM GLUCONATE-NACL 2-0.675 GM/100ML-% IV SOLN
2.0000 g | Freq: Once | INTRAVENOUS | Status: AC
  Administered 2020-10-26: 2000 mg via INTRAVENOUS
  Filled 2020-10-26: qty 100

## 2020-10-26 MED ORDER — CHLORHEXIDINE GLUCONATE 0.12% ORAL RINSE (MEDLINE KIT)
15.0000 mL | Freq: Two times a day (BID) | OROMUCOSAL | Status: DC
  Administered 2020-10-26 – 2020-10-31 (×12): 15 mL via OROMUCOSAL

## 2020-10-26 MED ORDER — SODIUM CHLORIDE 0.9% FLUSH
10.0000 mL | Freq: Two times a day (BID) | INTRAVENOUS | Status: DC
  Administered 2020-10-26 – 2020-11-01 (×10): 10 mL

## 2020-10-26 MED ORDER — HEMOSTATIC AGENTS (NO CHARGE) OPTIME
TOPICAL | Status: DC | PRN
  Administered 2020-10-26 (×5): 1 via TOPICAL

## 2020-10-26 MED FILL — Electrolyte-R (PH 7.4) Solution: INTRAVENOUS | Qty: 5000 | Status: AC

## 2020-10-26 MED FILL — Lidocaine HCl Local Soln Prefilled Syringe 100 MG/5ML (2%): INTRAMUSCULAR | Qty: 5 | Status: AC

## 2020-10-26 MED FILL — Heparin Sodium (Porcine) Inj 1000 Unit/ML: INTRAMUSCULAR | Qty: 30 | Status: AC

## 2020-10-26 MED FILL — Sodium Chloride IV Soln 0.9%: INTRAVENOUS | Qty: 6000 | Status: AC

## 2020-10-26 MED FILL — Heparin Sodium (Porcine) Inj 1000 Unit/ML: INTRAMUSCULAR | Qty: 10 | Status: AC

## 2020-10-26 MED FILL — Sodium Bicarbonate IV Soln 8.4%: INTRAVENOUS | Qty: 200 | Status: AC

## 2020-10-26 MED FILL — Calcium Chloride Inj 10%: INTRAVENOUS | Qty: 10 | Status: AC

## 2020-10-26 NOTE — Progress Notes (Signed)
Dr. Dorris Fetch notified of increasing CT output despite administration of orders blood products. Notified of all current labs results at the time and that STAT labs were being sent. All critical values were reported to Dr. Dorris Fetch. MD stated he was coming to bedside to reevaluate her bleeding and to prepare her for open chest. Also notified Dr. Shirlee Latch of the above to keep him updated.   I notified the charge nurse and she activated OR team.   ECMO ran smoothly during the procedure with one event of dropped flows and venous suction when MD was pushing on the heart and cannulas- flows returned immediately. Bedside RN administered blood products as ordered by Dr. Dorris Fetch.   Patient's BP and MAP remain stable on current flows (2.9-3.0 at 2670 RPMs) with maintained pulsatility via impella and inotropic medications. See MAR for RN's titrations. Current BP 76/68(70) on 5 epi, 0.375 mil, and 0.02 vaso.   CT output currently stable at <100 mL/hr. There is consistent oozing from the R chest of the [facility] dressing.   Plan is to keep MAP >65 but avoid hypertension to avoid bleeding. Continue ordered blood administration orders (1 platelet left), and send repeat labs once all products have been administered.

## 2020-10-26 NOTE — Progress Notes (Addendum)
MTP implemented during bedside open heart washout.  4 FFP, 4 RBCs, 2 platelets given. Verified by 2 RNs   [id number] 22 [id number] Q (START: 0019 END: 0200) [id number] 22 [id number] W (START: 0200 END: 0400)  W2399 22 052340 Z (START: 2354 END: 0030) W2399 22 [id number] 3 (START: 2350 END: 0023) [id number] 22 [id number] A (START: 0023 END: 0100) L2751 22 [id number] 4 (START: 0043 END: 0120)  B4496 22 759163 T (START: 2354 END: 0015) W4665 22 993570 K (START: 2354 END: 0030) V7793 22 903009 T (START: 0026 END: 0100) Q3300 22 [id number] N (START: 0039 END: 0130)

## 2020-10-26 NOTE — Progress Notes (Signed)
      [address]       [place] [address]             [phone]      Remains sedated on vent, ECMO  BP (!) 82/74   Pulse 79   Temp 97.7 F (36.5 C) (Core)   Resp (!) 0   Ht 5\' 7"  (1.702 m)   Wt 82.6 kg   SpO2 100%   BMI 28.52 kg/m  ECMO flows 2.4-2.7 L/min Impella on p3 with flow 0.3- .06  Still making urine CT output down PA pressures remain low  Being transfused for anemia  07 C. Viviann Spare, MD [facility] [phone]

## 2020-10-26 NOTE — CV Procedure (Signed)
[facility] ECM[facility] NOTE:   Indication: Respiratory failure. Cardiogenic shock    Initial cannulation date: 9/20   ECMO type: [facility] ECM[facility]   Dual lumen inflow/return cannula:   1) Central cannulation (RA/AO)   ECMO events:   - Initial cannulation 10/10/2020 - Cannula repositioned N/A     Daily data:   Flow 2.8 L RPM 2600 Sweep  2.5 L   Labs:   ABG    Component Value Date/Time   PHART 7.400 10/26/2020 0614   PCO2ART 46.4 10/26/2020 0614   PO2ART 520 (H) 10/26/2020 0614   HCO3 28.9 (H) 10/26/2020 0614   TCO2 30 10/26/2020 0614   ACIDBASEDEF 3.0 (H) 10/26/2020 2330   O2SAT 100.0 10/26/2020 0614    Hgb 6.1 Platelets 38 LDH 134 PTT = 66 Lactic acid 6.2   Plan:  Continue ECMO support. Push flow as tolerated Continue to replace blood products. Keep hgb > 8 PLTs > 50k  Optimize preload CVP 10-12 goal Continue pressors Continue Impella for LV vent. Parameters look good today.       Discussed in multidisciplinary fashion on ECMO rounds with CCM, Cardiology, ECMO coordinator/specialist, RT, PharmD and nursing staff all present.    Arvilla Meres, MD  8:14 AM

## 2020-10-26 NOTE — Progress Notes (Signed)
1 Day Post-Op Procedure(s) (LRB): BEDSIDE POST OPERATIVE CORONARY ARTERY BYPASS GRAFTING EXPLORATION (N/A) Subjective: Rounds with ECMO team Intubated, sedated  Objective: Vital signs in last 24 hours: Temp:  [96.3 F (35.7 C)-97.3 F (36.3 C)] 97.3 F (36.3 C) (09/21 0630) Pulse Rate:  [50-112] 77 (09/21 0630) Cardiac Rhythm: Normal sinus rhythm (09/21 0400) Resp:  [0-48] 0 (09/21 0615) SpO2:  [100 %] 100 % (09/21 0630) Arterial Line BP: (65-115)/(60-80) 75/69 (09/21 0630) FiO2 (%):  [40 %-100 %] 40 % (09/21 0630) Weight:  [82.6 kg] 82.6 kg (09/21 0500)  Hemodynamic parameters for last 24 hours: PAP: (9-188)/(-8-29) 22/3  Intake/Output from previous day: 09/20 0701 - 09/21 0700 In: 17249.9 [I.V.:7067.6; Blood:6121.2; IV Piggyback:977.5] Out: 13086 [Urine:8540; Blood:5022; Chest Tube:3790] Intake/Output this shift: No intake/output data recorded.  Intubated, sedated Esmarch dressing in place, not distended Edematous Serosanguinous drainage from CT  Lab Results: Recent Labs    10/22/2020 2328 10/07/2020 2330 10/26/20 0409 10/26/20 0412 10/26/20 0614  WBC 2.7*  --  2.1*  --   --   HGB 7.3*   < > 7.1* 6.1* 6.1*  HCT 22.4*   < > 20.5* 18.0* 18.0*  PLT 19*  --  38*  --   --    < > = values in this interval not displayed.   BMET:  Recent Labs    11/04/2020 2328 10/11/2020 2330 10/26/20 0409 10/26/20 0412 10/26/20 0614  NA 145   < > 144 146* 145  K 3.3*   < > 3.3* 3.3* 3.6  CL 106  --  106  --   --   CO2 20*  --  23  --   --   GLUCOSE 242*  --  164*  --   --   BUN 11  --  10  --   --   CREATININE 1.44*  --  1.39*  --   --   CALCIUM 7.5*  --  7.1*  --   --    < > = values in this interval not displayed.    PT/INR:  Recent Labs    10/26/20 0409  LABPROT 17.5*  INR 1.4*   ABG    Component Value Date/Time   PHART 7.400 10/26/2020 0614   HCO3 28.9 (H) 10/26/2020 0614   TCO2 30 10/26/2020 0614   ACIDBASEDEF 3.0 (H) 10/13/2020 2330   O2SAT 100.0 10/26/2020  0614   CBG (last 3)  Recent Labs    10/26/20 0455 10/26/20 0611 10/26/20 0653  GLUCAP 129* 131* 111*    Assessment/Plan: S/P Procedure(s) (LRB): BEDSIDE POST OPERATIVE CORONARY ARTERY BYPASS GRAFTING EXPLORATION (N/A) Remains critically ill requiring full support with [facility] ECMO NEURO- sedated CV- 0.5 -1 L flow with Impella, 2.75 with ECMO  Requiring multiple pressors  Filling pressures low RESP_ Acute respiratory failure secondary to cardiac arrest and flash pulmonary edema  CXR edema/ ARDS pattern RENAL- good UO and creatinine 1.4  Will need diuresis when hemodynamics can tolerate ENDO- CBG mildly elevated GI- will need to address nutrition in near future HEME- anemia, coagulopathy and thrombocytopenia secondary to massive blood loss  Currently CT output is down dramatically  Replace PRBC and factors as labs indicate    LOS: 5 days    [name] 10/26/2020

## 2020-10-26 NOTE — Progress Notes (Signed)
Progress Note  Patient Name: Lenora Boys Date of Encounter: 10/26/2020  Ms. Amescua was last seen in the office in October 2021 and was tentatively scheduled to follow-up in October 2022.  She presents to the hospital with CCS class IV anginal discomfort and ruled in for non-STEMI.  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  Patient was scheduled for CABG on 10/11/2020.  Based on review of electronic medical records and notes that she had chest discomfort when she arrived to the operating room and thereafter became hypotensive and subsequently suffered a cardiac arrest likely due to progressive CAD.  She underwent four-vessel bypass but due to pulmonary edema/lung injury/cardiogenic shock she was having difficulty oxygenating and was transitioned to [place] ECMO /  Impella.   She was transferred to CVICU postoperatively and has been cared by critical care medicine, advanced heart failure team as well as CT surgery.  She has required multiple units of blood products due to bleeding overnight requiring chest washouts x2.  Patient is currently on inotropic support, 3 vasopressors, amiodarone drip, VA ECMO and Impella support.  Her urine output appears to be adequate, most recent hemoglobin 7.1 g/dL, serum creatinine 4.43 mg/dL.  No family present at bedside.  We will follow the patient peripherally.  Will defer her current management postoperatively to CCM, advanced heart failure, and CT surgery.  No charge submitted.  Tessa Lerner, DO, [facility] [place] Office: [phone] 10/26/2020, 3:36 PM

## 2020-10-26 NOTE — Progress Notes (Signed)
Initial Nutrition Assessment  DOCUMENTATION CODES:   Not applicable  INTERVENTION:   Recommend initiation of TF within 24-48 hours  Tube Feeding Recommendations: Pivot 1.5 at 60 ml/hr Begin at 20 ml/hr with titration by 10 mL q 8 hours until goal rate of 60 ml/hr Provides 135 g of protein, 2160 kcals, 1094 mL of free water   NUTRITION DIAGNOSIS:   Inadequate oral intake related to acute illness as evidenced by NPO status.  GOAL:   Patient will meet greater than or equal to 90% of their needs  MONITOR:   Vent status, Labs, Weight trends, TF tolerance  REASON FOR ASSESSMENT:   Ventilator    ASSESSMENT:   74 yo female admitted with NSTEMI and scheduled for urgent CABG, suffered cardiac arrest post induction, flash pulmonary edema, shock, required intubation and VA ECMO cannulation. PMH includes CAD, HLD, HTN  9/16 Admitted 9/20 2vCAD s/p CABG x 4, cardiac arrest post induction, flash pulmonary edema, VA ECMO cannulation, Intubated  Central VA ECMO with Impella, goal eventually wean ECMO to Impella support alone. Requiring mutliple pressors/inotropes  Pt remains on vent support, sedated, chest open. Severe bleeding overnight, receiving multiple blood products, chest washout x 2 today EBL: 5L Chest tube: 3.8 L  OG tube extends into stomach per chest xray.   Current wt 82.6 kg post-op. Admit weight of 68 kg on 9/16. No weights in between. Net negatiev per I/O flow sheet; + mild edema on exam  Unable to obtain diet and weight history from patient at this time. Recorded po inatke 50-100% on 9/16, 9/17. No Other po intake recorded  Labs: reviewed Meds: colace, miralax, ss novolog   NUTRITION - FOCUSED PHYSICAL EXAM:  Flowsheet Row Most Recent Value  Orbital Region No depletion  Upper Arm Region No depletion  Thoracic and Lumbar Region No depletion  Buccal Region Unable to assess  Temple Region No depletion  Clavicle Bone Region No depletion  Clavicle and  Acromion Bone Region Mild depletion  Scapular Bone Region Mild depletion  Dorsal Hand No depletion  Patellar Region No depletion  Anterior Thigh Region No depletion  Posterior Calf Region No depletion  Edema (RD Assessment) Mild       Diet Order:   Diet Order     None       EDUCATION NEEDS:   Not appropriate for education at this time  Skin:  Skin Assessment: Skin Integrity Issues: Skin Integrity Issues:: Other (Comment), Incisions Incisions: multiple chest Other: open sternum  Last BM:  9/18  Height:   Ht Readings from Last 1 Encounters:  10/17/2020 5\' 7"  (1.702 m)    Weight:   Wt Readings from Last 1 Encounters:  10/26/20 82.6 kg      BMI:  Body mass index is 28.52 kg/m.  Estimated Nutritional Needs:   Kcal:  2040-2380 kcals  Protein:  120-150 g  Fluid:  >/= 1.7 L    01-02-2005 MS, RDN, LDN, CNSC Registered Dietitian III Clinical Nutrition RD Pager and On-Call Pager Number Located in [place]

## 2020-10-26 NOTE — Op Note (Signed)
NAME: Veronica, Swanson MEDICAL RECORD NO: [id number] ACCOUNT NO: [id number] DATE OF BIRTH: September 14, 1945 FACILITY: [facility] LOCATION: [facility] PHYSICIAN: Salvatore Decent. Dorris Fetch, MD  Operative Report   DATE OF PROCEDURE: 10/17/2020  PREOPERATIVE DIAGNOSIS:  Left main and 2-vessel coronary disease.  POSTOPERATIVE DIAGNOSES:  Left main and 2-vessel coronary artery disease plus cardiac arrest after induction, flash pulmonary edema.  CLINICAL NOTE:  Ms. Veronica Swanson is a 75 year old woman who presented with unstable chest pain.  She was found to have left main and severe 2-vessel coronary artery disease.  She had impaired left ventricular function with ejection fraction of approximately  35%.  She was offered coronary artery bypass grafting for treatment.  The indications, risks, benefits, and alternatives were discussed in detail with the patient.  She understood and accepted the risks and agreed to proceed.  DESCRIPTION OF PROCEDURE:  Ms. Veronica Swanson was brought to the preoperative holding area on 10/30/2020.  She had a Swan-Ganz catheter and arterial blood pressure monitoring line placed.  She was taken to the operating room, anesthetized and intubated.   Intravenous antibiotics were administered.  A Foley catheter was placed.  She became progressively more hypotensive and was unresponsive to epinephrine given intravenously.  CPR was initiated and she was rapidly prepped and draped.  When I arrived, CPR  was ongoing and she had a pulseless electrical activity rhythm. With CPR ongoing, a sternotomy incision was made.  The patient was heparinized.  The sternotomy was performed using an oscillating saw.  Simultaneously, incision was made in the medial  aspect of the right leg and the greater saphenous vein was harvested from the right thigh endoscopically.  Sternal retractor was placed.  The pericardium was opened.  There was no cardiac activity.  Open massage was performed.  The aorta was quickly  cannulated via a  2-0 Ethibond pledgeted pursestring suture. A dual stage venous cannula was placed via pursestring suture in the right atrium.  Cardiopulmonary bypass was initiated.  ACT did turn out to be adequate.  The patient fibrillated and required  multiple defibrillations during this time, but ultimately resumed a rhythm once back on cardiopulmonary bypass.  She was given amiodarone intravenously.  The sternal retractor was removed and Rultract retractor was placed. The left mammary was harvested  using standard technique.  The vein and mammary both were good quality vessels.  It should be noted that upon opening the pleural space, the left lung was markedly distended and turned out the right lung was too. Both lungs were very edematous and did not  deflate when the ventilator was stopped.  The coronary arteries were inspected and anastomotic sites were chosen.  The conduits were inspected and prepared.  A retrograde cardioplegia cannula was placed via pursestring suture in the right atrium and  directed into the coronary sinus.  An antegrade cardioplegia cannula was placed in the ascending aorta and a foam pad was placed in the pericardium to insulate the heart.  A temperature probe was placed in the myocardial septum.  The aorta was cross clamped, cardiac arrest then was achieved with a combination of cold antegrade and retrograde blood cardioplegia.  There was a rapid diastolic arrest.  There was septal cooling to 10 degrees Celsius.  A reversed saphenous vein graft was placed sequentially to obtuse marginals 1 and 2.  These were both 1.5 mm vessels.  A side-to-side anastomosis was performed to OM1 and end-to-side to OM2. A probe passed easily proximally and distally at the completion of  each anastomosis,  cardioplegia was administered down the vein graft and there was good flow and good hemostasis.  Next, a reversed saphenous vein graft was placed end-to-side to the first diagonal branch of the LAD.  This was a  1.5 mm good quality vessel.  The vein was of good quality.  The end-to-side anastomosis was performed with a running 7-0 Prolene suture.  A  probe passed easily.  Cardioplegia was administered down the graft.  Additional cardioplegia was also administered retrograde.  Next, the left internal mammary artery was bevelled at its distal end.  It was anastomosed end-to-side to the distal LAD.  There was only a short segment of the LAD that was patent.  This was a large vessel, but heavily calcified. A probe did pass into  the distal diagonal branch.  The end-to-side anastomosis was performed with a running 8-0 Prolene suture.  At the completion of the mammary to LAD anastomosis, the bulldog clamp was removed.  There was good hemostasis and septal rewarming was noted.   The bulldog clamp was replaced.  The mammary pedicle was tacked to the epicardial surface of the heart with 6-0 Prolene sutures.  Additional cardioplegia was administered.  The vein grafts were cut to length.  The proximal vein graft anastomoses were performed to 4.5 mm punch aortotomies with running 6-0 Prolene sutures.  At the completion of the final proximal anastomosis, the  patient was placed in Trendelenburg position.  A warm dose of retrograde cardioplegia was administered.  The bulldog clamp was removed from the left mammary artery.  The aortic root was de-aired and the aortic crossclamp was removed.  The total  crossclamp time was 93 minutes.  While rewarming was completed, all proximal and distal anastomoses were inspected for hemostasis.  Epicardial pacing wires were placed on the right ventricle and right atrium.  The retrograde cardioplegia cannula was removed and the suture was tied.   When the patient had reached a core temperature of 37 degrees Celsius, an attempt was made to wean from bypass.  She was on milrinone and epinephrine infusions.  She weaned from bypass.  The initial cardiac index was low, but it did improve with  time and  transesophageal echocardiography showed an ejection fraction of approximately 30-35%, consistent with her preoperative echo.  The prebypass echo was limited due to her code.  Unfortunately, her oxygen saturations steadily dropped into the low 80s.   Blood gas confirmed pO2 in the 40s.  She was placed back on cardiopulmonary bypass with thoughts towards trying to ultrafiltrate, but there was not adequate volume to do so.  Dr. Shirlee Latch was consulted.  A second attempt was made to wean off bypass and  again limited by poor oxygenation, decision was made to proceed with extracorporeal membrane oxygenation.  The plan was to place an Impella 5.5 to vent the left ventricle.  A partial occlusion clamp was placed on the ascending aorta and a graft was sewn  end-to-side to the ascending aorta with a running 5-0 Prolene suture.  The peel-away sheath was placed into the graft.  A wire was passed into the left ventricle and the Impella 5.5 was advanced into the left ventricle, but while trying to position the  device, there was a tear in the posterior aspect of the left ventricle.  The Impella was pulled back and this area was repaired with multiple 2-0 Ethibond pledgeted horizontal mattress sutures.  A wire was again passed in the left ventricle and a new 5.5  Impella was  inserted and had good positioning confirmed both with fluoroscopy and transesophageal echocardiography.  The patient was then weaned from cardiopulmonary bypass, again with low oxygen saturations.  The tubing from the venous cannula and  arterial cannula were connected to the ECMO circuit and ECMO was initiated with flows in the 2.5 liters range. Flows from the Impella were 1-1.5 liters.  The patient had coagulopathic bleeding and blood products were administered.  A partial dose of  protamine was administered.  Chest was copiously irrigated.  There was no opportunity to close the chest.  An Esmarck dressing was sewn to the skin edges.  Three  chest tubes were placed, two Blake drains were placed, one in each pleural space followed by  a regular chest tube in the anterior mediastinum prior to placing the Esmarch dressing.  The patient then was transported from the operating room to the surgical intensive care unit in critical and unstable condition.   SHW D: 2020-11-19 8:57:44 pm T: 10/26/2020 2:47:00 am  JOB: [id number]/ [id number]

## 2020-10-26 NOTE — Significant Event (Signed)
SIGNIFICANT EVENT NOTE  Event Summary: Ongoing bleeding issues, Dr. Dorris Fetch re-opened chest to address. Blood products given to correct coagulopathy.  Total Product Given (during procedure): 4 pRBC 4 FFP 1 Plt  Total Suction Output: 1200cc  MD Notified: 2325 MD Arrival Time: 2350 End Time: 0105  OR notified: 2326 OR arrival: 2353  Plan: Infuse 1 more unit platelets, then re-check CBC and coags. Chest left open, covered with sterile Esmark dressing.   Peyton Bottoms, RN 2H Charge Nurse 10/26/20 1:37 AM

## 2020-10-26 NOTE — Progress Notes (Signed)
ECMO PROGRESS NOTE  NAME:  Veronica Swanson, MRN:  [id number], DOB:  [birth year], LOS: 5 ADMISSION DATE:  10/22/2020, CONSULTATION DATE:  10/24/2020 REFERRING MD:  Roxan Hockey, CHIEF COMPLAINT:  VA-ECMO   HPI/course in hospital  75 year old woman who was placed on VA-ECMO support following CABGx4.  She presented 4 days ago with CCS class 4 angina and ruled in for NSTEMI. Echo showed EF of 35% with mild mitral regurgitation and an anterior wall regional wall motion abnormalities.  Coronary catheterization revealed triple-vessel disease.  LVEDP was normal at that time.  She was scheduled for urgent CABG.  This morning she began experiencing retrosternal chest pain around the time of arrival in the operating room.  She received a standard cardiac conduction with no hypotension.  However, she became progressively hypotensive and suffered a cardiac arrest likely due to a further ischemic event.  She was urgently cannulated for cardiopulmonary bypass.  She developed severe pulmonary edema and was very difficult to oxygenate.  She underwent SL bypass with reasonable targets.  She could not separate from bypass due to pulmonary edema and poor LV function.  She was converted to [facility] ECMO and a 5 5 Impella was placed and she was transferred to the ICU.  Past Medical History   Past Medical History:  Diagnosis Date   Carotid artery occlusion    Coronary artery disease    Hyperlipidemia    Hypertension    Myocardial infarction ([place]) 1997     Past Surgical History:  Procedure Laterality Date   ABDOMINAL HYSTERECTOMY     CARDIAC CATHETERIZATION     LEFT HEART CATH AND CORONARY ANGIOGRAPHY N/A 10/20/2020   Procedure: LEFT HEART CATH AND CORONARY ANGIOGRAPHY;  Surgeon: Nigel Mormon, MD;  Location: [facility];  Service: Cardiovascular;  Laterality: N/A;     Review of Systems:   Review of Systems  Unable to perform ROS: Critical illness   Social History   reports that she  has been smoking cigarettes. She has never used smokeless tobacco. She reports that she does not drink alcohol and does not use drugs.   Family History   Her family history includes Heart disease in her father and mother; Hypertension in her father. There is no history of Thyroid disease.   Allergies Allergies  Allergen Reactions   Penicillins Other (See Comments)    Dizzy      Home Medications  Prior to Admission medications   Medication Sig Start Date End Date Taking? Authorizing Provider  amLODipine (NORVASC) 5 MG tablet Take 5 mg by mouth every morning. 08/29/20  Yes [provider]  aspirin 81 MG EC tablet Take 162 mg by mouth every morning. Swallow whole.   Yes [provider]  latanoprost (XALATAN) 0.005 % ophthalmic solution Place 1 drop into both eyes at bedtime. 01/15/20  Yes [provider]  losartan-hydrochlorothiazide (HYZAAR) 50-12.5 MG tablet TAKE 1 TABLET BY MOUTH EVERY DAY Patient taking differently: Take 1 tablet by mouth every morning. 04/06/19  Yes Miquel Dunn, NP  Multiple Vitamin (MULTIVITAMIN WITH MINERALS) TABS tablet Take 1 tablet by mouth every morning. Centrum Silver   Yes [provider]  rosuvastatin (CRESTOR) 20 MG tablet TAKE 1 TABLET BY MOUTH EVERY DAY Patient taking differently: Take 20 mg by mouth every morning. 04/02/19  Yes Adrian Prows, MD     Interim history/subjective:  Bleeding seems to have eased up a bit. Multiple  blood products given in balanced fashion through evening and washed out x 2 by TCTS. Remains on vent.  Objective   Blood pressure 112/67, pulse 77, temperature (!) 97.3 F (36.3 C), temperature source Core (Comment), resp. rate (!) 0, height _0  (1.702 m), weight 82.6 kg, SpO2 100 %. PAP: (9-188)/(-8-29) 22/3  Vent Mode: PCV FiO2 (%):  [40 %-100 %] 40 % Set Rate:  [16 bmp] 16 bmp PEEP:  [5 cmH20-10 cmH20] 5 cmH20 Plateau Pressure:  [14 cmH20-19 cmH20] 15 cmH20   Intake/Output Summary  (Last 24 hours) at 10/26/2020 0755 Last data filed at 10/26/2020 0700 Gross per 24 hour  Intake 17249.88 ml  Output 17352 ml  Net -102.12 ml    Filed Weights   10/28/2020 1600 10/26/20 0500  Weight: 68 kg 82.6 kg    Examination: Intubated and heavily sedated Pupils pinpoint, reactive Multiple mediastinal drains in place with minimal output when not on suction Dressed over sternotomy seems a little more full today but pressor requirements stable Ext with trace edema RASS -5 Foley in place, minimal urine Not triggering vent, DP looks fine  ABG looks good K/Mg/Ca being repleted Lactate 6 from 10 Hgb down, being transfused Plts also down but responded to tx Stable leukopenia CBG ok Net even CXR mild edema vs. ARDS  Assessment & Plan:  Critically ill due to cardiogenic and vasoplegic shock following CABG complicated by perioperative cardiac arrest requiring inotropic, vasopressor mechanical cardiac support and VA ECMO  Ischemic cardiomyopathy due to recent acute non-ST elevation coronary syndrome Critically ill due to acute hypoxic respiratory failure requiring mechanical ventilation and ECMO support Acute kidney injury Acute blood loss anemia Coagulopathy secondary to cardiopulmonary bypass  - Continue vent support, VAP prevention bundle, sedation titrated to RASS -4 to -5 - Adjust sweep for pH 7.35-7.45 - Ongoing blood loss precludes AC at present, work toward balanced blood product resuscitation - Impella, inotrope and ECMO pump management per CHF/TCTS discussion - Levophed/vasopressin titrating to MAP 65 for concurrent vasoplegia from ECMO circuit and arrest - Guarded prognosis  Daily Goals Checklist  Pain/Anxiety/Delirium protocol (if indicated): Dilaudid and Versed infusions VAP protocol (if indicated): Bundle in place Respiratory support goals: ECMO rest setting, keep plateau pressure less than 25, current (9/21) is PEEP 5, Plat 14 Blood pressure target: MAP  70-80 DVT prophylaxis: Daily discussion until bleeding under better control Nutritional status and feeding goals: Defer nutrition initiation for 24 to 48 hours GI prophylaxis: PPI Fluid status goals: Support intravascular volume with colloid Urinary catheter: Guide hemodynamic management Central lines: Right IJ MAC introducer, left radial art line Glucose control: Insulin infusion by EndoTool Mobility/therapy needs: Bedrest Antibiotic de-escalation: Vancomycin and cefuroxime for open chest Home medication reconciliation: On hold Daily labs: Per ECMO protocol Code Status: Full code Family Communication: Per Dr. Roxan Hockey Disposition: ICU   Patient critically ill due to shock, resp failure Interventions to address this today vent and sedation titration Risk of deterioration without these interventions is high  I personally spent 45 minutes providing critical care not including any separately billable procedures  Erskine Emery MD [facility]  Prefer epic messenger for cross cover needs If after hours, please call E-link

## 2020-10-26 NOTE — Progress Notes (Signed)
[place] for heparin Indication:  Impella 5.5 + ECMO [facility]  Allergies  Allergen Reactions   Penicillins Other (See Comments)    Dizzy     Patient Measurements: Height: _0  (170.2 cm) Weight: 82.6 kg (182 lb 1.6 oz) IBW/kg (Calculated) : 61.6 Heparin Dosing Weight: 78.7 kg   Vital Signs: Temp: 97.7 F (36.5 C) (09/21 1545) Temp Source: Core (09/21 1545) BP: 82/74 (09/21 1459) Pulse Rate: 79 (09/21 1545)  Labs: Recent Labs    10/24/20 0233 10/16/2020 0305 10/12/2020 4854 10/19/2020 1957 10/22/2020 1958 10/26/20 0409 10/26/20 0412 10/26/20 0803 10/26/20 0834 10/26/20 0853 10/26/20 1303 10/26/20 1318  HGB 10.8* 10.4*   < >  --    < > 7.1*   < > 8.2* 6.5*  --  7.4* 7.1*  HCT 32.4* 31.8*   < >  --    < > 20.5*   < > 23.9* 19.0*  --  21.4* 21.0*  PLT 207 207   < >  --    < > 38*  --  71*  --   --  88*  --   APTT  --  86*   < >  --    < > 66*  --   --   --  72* 81*  --   LABPROT  --  15.4*   < >  --    < > 17.5*  --   --   --  19.0* 21.6*  --   INR  --  1.2   < >  --    < > 1.4*  --   --   --  1.6* 1.9*  --   HEPARINUNFRC 0.29* 0.44  --  <0.10*  --   --   --   --   --   --   --   --   CREATININE 1.52* 1.66*   < >  --    < > 1.39*  --  1.20*  --   --  1.19*  --    < > = values in this interval not displayed.    Estimated Creatinine Clearance: 44.4 mL/min (A) (by C-G formula based on SCr of 1.19 mg/dL (H)).   Medical History: Past Medical History:  Diagnosis Date   Carotid artery occlusion    Coronary artery disease    Hyperlipidemia    Hypertension    Myocardial infarction ([place]) 1997    Medications:  Scheduled:   sodium chloride   Intravenous Once   sodium chloride   Intravenous Once   sodium chloride   Intravenous Once   sodium chloride   Intravenous Once   acetaminophen  1,000 mg Oral Q6H   Or   acetaminophen (TYLENOL) oral liquid 160 mg/5 mL  1,000 mg Per Tube Q6H   acetaminophen (TYLENOL) oral liquid 160 mg/5 mL  650 mg Per  Tube Once   Or   acetaminophen  650 mg Rectal Once   aspirin EC  325 mg Oral Daily   Or   aspirin  324 mg Per Tube Daily   bisacodyl  10 mg Oral Daily   Or   bisacodyl  10 mg Rectal Daily   calcium chloride  1 g Intravenous Once   chlorhexidine gluconate (MEDLINE KIT)  15 mL Mouth Rinse BID   Chlorhexidine Gluconate Cloth  6 each Topical Daily   docusate  100 mg Per Tube BID    HYDROmorphone (DILAUDID) injection  0.5 mg  Intravenous Once   insulin aspart  0-24 Units Subcutaneous Q4H   mouth rinse  15 mL Mouth Rinse 10 times per day   pantoprazole (PROTONIX) IV  40 mg Intravenous Q24H   polyethylene glycol  17 g Per Tube Daily   rosuvastatin  40 mg Oral Daily   sodium chloride flush  10-40 mL Intracatheter Q12H   sodium chloride flush  3 mL Intravenous Q12H    Assessment: 4 yof who underwent CABG x4 complicated cardiac arrest - now has impella 5.5 and on VA-ECMO. No AC PTA.  VA-ECMO with flow rate ~2.8 L, sweep 2.5 L. Impella 5.5 running at P-3 - purge flow 7 mL/hr (350 units/hr of heparin from purge solution). Purge pressure 500s. Still bleeding however improving.   Goal of Therapy:  Heparin level <0.3 units/ml Monitor platelets by anticoagulation protocol: Yes   Plan:  Continue heparin purge solution (50 units/mL) with Impella No systemic heparin at this time Monitor s/sx of bleeding - monitor heparin level with AM labs  Antonietta Jewel, PharmD, [place] Pharmacist  Phone: [phone] 10/26/2020 4:01 PM  Please check AMION for all [place] phone numbers After 10:00 PM, call [place] [phone]

## 2020-10-26 NOTE — Progress Notes (Signed)
Advanced Heart Failure Rounding Note   Subjective:    On central VA ECMO with Impella at P-3 for vent  Intubated. Sedated. Chest open   Had severe bleeding overnight. Received multiple blood products and had chest washout x 2   On NE 3 Epi 2 VP 0.03  Milrinone 0.375  ECMO flowing ~2.5L    Making urine  Scr 1.39  Hgb 6.1   Objective:   Weight Range:  Vital Signs:   Temp:  [96.3 F (35.7 C)-97.3 F (36.3 C)] 97.3 F (36.3 C) (09/21 0630) Pulse Rate:  [50-112] 77 (09/21 0630) Resp:  [0-48] 0 (09/21 0615) SpO2:  [100 %] 100 % (09/21 0630) Arterial Line BP: (65-115)/(60-80) 75/69 (09/21 0630) FiO2 (%):  [40 %-100 %] 40 % (09/21 0630) Weight:  [82.6 kg] 82.6 kg (09/21 0500) Last BM Date: 10/23/20  Weight change: Filed Weights   10/29/2020 1600 10/26/20 0500  Weight: 68 kg 82.6 kg    Intake/Output:   Intake/Output Summary (Last 24 hours) at 10/26/2020 0800 Last data filed at 10/26/2020 0700 Gross per 24 hour  Intake 17249.88 ml  Output 17352 ml  Net -102.12 ml     Physical Exam: General:  Intubated/sedated  HEENT: normal + ETT Neck: supple.RIJ swan Cor: chest open esmarch in place CTs in place. Ecmo cannulas ok  Lungs: coarse Abdomen: obese soft, nontender, nondistended. No hepatosplenomegaly. No bruits or masses. hypoactive bowel sounds. Extremities: no cyanosis, clubbing, rash, tr edema Neuro: intubated sedated  Telemetry: Sinus 70-80s Personally reviewed   Labs: Basic Metabolic Panel: Recent Labs  Lab 10/24/20 0233 10/24/2020 0305 11/04/2020 0808 11/03/2020 1534 10/14/2020 1537 11/04/2020 1545 10/11/2020 1717 10/24/2020 1958 10/13/2020 2104 10/20/2020 2328 11/03/2020 2330 10/26/20 0409 10/26/20 0412 10/26/20 0614  NA 138 136   < > 144   < > 145   < > 146*   < > 145 146* 144 146* 145  K 3.7 3.7   < > 3.3*   < > 3.2*   < > 2.9*   < > 3.3* 3.2* 3.3* 3.3* 3.6  CL 107 105   < > 100  --  100  --  106  --  106  --  106  --   --   CO2 22 21*  --   --   --   --    --  20*  --  20*  --  23  --   --   GLUCOSE 111* 100*   < > 218*  --  218*  --  245*  --  242*  --  164*  --   --   BUN 14 15   < > 9  --  9  --  11  --  11  --  10  --   --   CREATININE 1.52* 1.66*   < > 0.80  --  0.80  --  1.40*  --  1.44*  --  1.39*  --   --   CALCIUM 8.1* 8.1*  --   --   --   --   --  8.2*  --  7.5*  --  7.1*  --   --   MG  --   --   --   --   --   --   --   --   --   --   --  1.7  --   --    < > = values in this  interval not displayed.    Liver Function Tests: Recent Labs  Lab 10/24/2020 1333 10/15/2020 1958 10/26/20 0409  AST 24 97* 63*  ALT '14 22 22  ' ALKPHOS 25* 16* 25*  BILITOT 0.5 1.3* 1.1  PROT 7.3 3.6* 3.7*  ALBUMIN 3.9 2.6* 2.2*   No results for input(s): LIPASE, AMYLASE in the last 168 hours. No results for input(s): AMMONIA in the last 168 hours.  CBC: Recent Labs  Lab 10/26/2020 1333 10/22/20 0051 10/29/2020 0305 10/07/2020 0808 11/04/2020 1038 11/02/2020 1047 10/22/2020 1555 10/19/2020 1717 10/28/2020 1958 10/12/2020 2104 10/29/2020 2328 10/15/2020 2330 10/26/20 0409 10/26/20 0412 10/26/20 0614  WBC 4.4   < > 4.6  --   --   --  5.7  --  3.9*  --  2.7*  --  2.1*  --   --   NEUTROABS 1.5*  --   --   --   --   --   --   --   --   --  2.1  --   --   --   --   HGB 13.0   < > 10.4*   < > 7.7*   < > 8.6*   < > 9.5*   < > 7.3* 6.8* 7.1* 6.1* 6.1*  HCT 39.5   < > 31.8*   < > 22.7*   < > 25.2*   < > 28.2*   < > 22.4* 20.0* 20.5* 18.0* 18.0*  MCV 85.9   < > 85.7  --   --   --  87.2  --  88.7  --  82.7  --  84.0  --   --   PLT 264   < > 207  --  86*  --  81*  --  49*  --  19*  --  38*  --   --    < > = values in this interval not displayed.    Cardiac Enzymes: No results for input(s): CKTOTAL, CKMB, CKMBINDEX, TROPONINI in the last 168 hours.  BNP: BNP (last 3 results) Recent Labs    10/22/20 0954  BNP 1,154.2*    ProBNP (last 3 results) No results for input(s): PROBNP in the last 8760 hours.    Other results:  Imaging: DG Chest Port 1 View  Result  Date: 10/06/2020 CLINICAL DATA:  Status post cardiac surgery. EXAM: PORTABLE CHEST 1 VIEW COMPARISON:  Chest x-ray 10/26/2020. FINDINGS: Endotracheal tube tip is approximately 3.6 cm above the carina. Swan-Ganz catheter tip projects over the main pulmonary artery. Enteric tube extends below the diaphragm into the stomach, its distal tip is not included on the image. ECMO catheters are present. Mediastinal drain is present. There are diffuse bilateral lung infiltrates. Costophrenic angles are clear. There is no definite pneumothorax. Artifact likely overlies the right lung apex. The cardiomediastinal silhouette appears stable. There are atherosclerotic calcifications of the aorta. No acute fractures are seen. IMPRESSION: 1. Diffuse bilateral airspace disease worrisome for edema, hemorrhage or infection. 2. Lines and tubes as above. Electronically Signed   By: Ronney Asters M.D.   On: 10/15/2020 17:45   DG C-Arm 1-60 Min-No Report  Result Date: 10/20/2020 Fluoroscopy was utilized by the requesting physician.  No radiographic interpretation.   DG C-Arm 1-60 Min-No Report  Result Date: 10/24/2020 Fluoroscopy was utilized by the requesting physician.  No radiographic interpretation.   VAS US DOPPLER PRE CABG  Result Date: 10/25/2020 PREOPERATIVE VASCULAR EVALUATION Patient Name:  Veronica Swanson  Date  of Exam:   10/24/2020 Medical Rec #: [id number]         Accession #:    [id number] Date of Birth: 1946/01/31         Patient Gender: F Patient Age:   75 years Exam Location:  [facility] Procedure:      VAS US DOPPLER PRE CABG Referring Phys: [facility] PATWARDHAN --------------------------------------------------------------------------------  Indications:      Pre-CABG. Risk Factors:     Hypertension, hyperlipidemia, past history of smoking, prior                   MI, coronary artery disease. Comparison Study: No prior study Performing Technologist: Darlin Coco RDMS RVT  Examination Guidelines: A complete  evaluation includes B-mode imaging, spectral Doppler, color Doppler, and power Doppler as needed of all accessible portions of each vessel. Bilateral testing is considered an integral part of a complete examination. Limited examinations for reoccurring indications may be performed as noted.  Right Carotid Findings: +----------+--------+--------+--------+------------+------------------+           PSV cm/sEDV cm/sStenosisDescribe    Comments           +----------+--------+--------+--------+------------+------------------+ CCA Prox  106     11                                             +----------+--------+--------+--------+------------+------------------+ CCA Distal65      11                          intimal thickening +----------+--------+--------+--------+------------+------------------+ ICA Prox  72      17              heterogenous                   +----------+--------+--------+--------+------------+------------------+ ICA Distal49      14                                             +----------+--------+--------+--------+------------+------------------+ ECA       121                                                    +----------+--------+--------+--------+------------+------------------+ +----------+--------+-------+--------+------------+           PSV cm/sEDV cmsDescribeArm Pressure +----------+--------+-------+--------+------------+ Subclavian154                                 +----------+--------+-------+--------+------------+ +---------+--------+--+--------+--+---------+ VertebralPSV cm/s54EDV cm/s16Antegrade +---------+--------+--+--------+--+---------+ Left Carotid Findings: +----------+--------+--------+--------+--------+--------+           PSV cm/sEDV cm/sStenosisDescribeComments +----------+--------+--------+--------+--------+--------+ CCA Prox  144     19                                +----------+--------+--------+--------+--------+--------+ CCA Distal74      11                               +----------+--------+--------+--------+--------+--------+ ICA  Prox  103     23              calcific         +----------+--------+--------+--------+--------+--------+ ICA Distal55      13                               +----------+--------+--------+--------+--------+--------+ ECA       129                                      +----------+--------+--------+--------+--------+--------+ +----------+--------+--------+--------+------------+ SubclavianPSV cm/sEDV cm/sDescribeArm Pressure +----------+--------+--------+--------+------------+           265                                  +----------+--------+--------+--------+------------+ +---------+--------+--+--------+--+--------+ VertebralPSV cm/s49EDV cm/s21Atypical +---------+--------+--+--------+--+--------+  ABI Findings: +---------+------------------+-----+---------+--------+ Right    Rt Pressure (mmHg)IndexWaveform Comment  +---------+------------------+-----+---------+--------+ Brachial 98                     triphasic         +---------+------------------+-----+---------+--------+ PTA                             absent            +---------+------------------+-----+---------+--------+ DP       68                0.69                   +---------+------------------+-----+---------+--------+ Great Toe34                0.35                   +---------+------------------+-----+---------+--------+ +---------+------------------+-----+---------+-------+ Left     Lt Pressure (mmHg)IndexWaveform Comment +---------+------------------+-----+---------+-------+ Brachial 93                     triphasic        +---------+------------------+-----+---------+-------+ PTA      71                0.72                  +---------+------------------+-----+---------+-------+ DP        69                0.70                  +---------+------------------+-----+---------+-------+ Great Toe42                0.43                  +---------+------------------+-----+---------+-------+ +-------+---------------+----------------+ ABI/TBIToday's ABI/TBIPrevious ABI/TBI +-------+---------------+----------------+ Right  0.69 / 0.35                     +-------+---------------+----------------+ Left   0.72 / 0.43                     +-------+---------------+----------------+  Right Doppler Findings: +--------+--------+-----+---------+--------+ Site    PressureIndexDoppler  Comments +--------+--------+-----+---------+--------+ [id number]           triphasic         +--------+--------+-----+---------+--------+ Radial  triphasic         +--------+--------+-----+---------+--------+ Ulnar                triphasic         +--------+--------+-----+---------+--------+  Left Doppler Findings: +--------+--------+-----+---------+--------+ Site    PressureIndexDoppler  Comments +--------+--------+-----+---------+--------+ [id number]           triphasic         +--------+--------+-----+---------+--------+ Radial               triphasic         +--------+--------+-----+---------+--------+ Ulnar                triphasic         +--------+--------+-----+---------+--------+  Summary: Right Carotid: The extracranial vessels were near-normal with only minimal wall                thickening or plaque. Left Carotid: The extracranial vessels were near-normal with only minimal wall               thickening or plaque. Vertebrals: Right vertebral artery demonstrates antegrade flow. Atypical left. Right ABI: Resting right ankle-brachial index indicates moderate right lower extremity arterial disease. The right toe-brachial index is abnormal. Left ABI: Resting left ankle-brachial index indicates moderate left lower extremity arterial disease. The left  toe-brachial index is abnormal. Bilateral Extremity: Doppler waveforms remain within normal limits with compression bilaterally for the radial arteries. Doppler waveforms remain within normal limits with compression bilaterally for the ulnar arteries.  Electronically signed by Servando Snare MD on 10/24/2020 at 4:42:48 PM.    Final (Updated)      Medications:     Scheduled Medications:  sodium chloride   Intravenous Once   sodium chloride   Intravenous Once   sodium chloride   Intravenous Once   sodium chloride   Intravenous Once   acetaminophen  1,000 mg Oral Q6H   Or   acetaminophen (TYLENOL) oral liquid 160 mg/5 mL  1,000 mg Per Tube Q6H   acetaminophen (TYLENOL) oral liquid 160 mg/5 mL  650 mg Per Tube Once   Or   acetaminophen  650 mg Rectal Once   aspirin EC  325 mg Oral Daily   Or   aspirin  324 mg Per Tube Daily   bisacodyl  10 mg Oral Daily   Or   bisacodyl  10 mg Rectal Daily   calcium chloride  1 g Intravenous Once   chlorhexidine gluconate (MEDLINE KIT)  15 mL Mouth Rinse BID   Chlorhexidine Gluconate Cloth  6 each Topical Daily   docusate  100 mg Per Tube BID    HYDROmorphone (DILAUDID) injection  0.5 mg Intravenous Once   mouth rinse  15 mL Mouth Rinse 10 times per day   pantoprazole (PROTONIX) IV  40 mg Intravenous Q24H   polyethylene glycol  17 g Per Tube Daily   rosuvastatin  40 mg Oral Daily   sodium chloride flush  10-40 mL Intracatheter Q12H   sodium chloride flush  3 mL Intravenous Q12H    Infusions:  sodium chloride     sodium chloride     sodium chloride     sodium chloride     sodium chloride     calcium gluconate     cefUROXime (ZINACEF)  IV Stopped (10/26/20 0647)   dexmedetomidine (PRECEDEX) IV infusion 0.7 mcg/kg/hr (10/26/20 0700)   DOPamine     epinephrine 6 mcg/min (10/26/20 0700)   heparin 50 units/mL (Impella PURGE) in dextrose 5 %  1000 mL bag     HYDROmorphone 1.5 mg/hr (10/26/20 0100)   insulin 4.4 Units/hr (10/26/20 0700)   lactated  ringers     lactated ringers 20 mL/hr at 10/26/20 0100   lactated ringers 50 mL/hr at 10/22/2020 2219   magnesium sulfate bolus IVPB 50 mL/hr at 10/26/20 0700   midazolam 3 mg/hr (10/26/20 0700)   milrinone 0.377 mcg/kg/min (10/26/20 0700)   nitroGLYCERIN     norepinephrine (LEVOPHED) Adult infusion Stopped (10/26/20 0049)   phenylephrine (NEO-SYNEPHRINE) Adult infusion     potassium chloride 10 mEq (10/26/20 0750)   vancomycin Stopped (10/11/2020 2234)   vasopressin 0.03 Units/min (10/26/20 0100)    PRN Medications: sodium chloride, dextrose, HYDROmorphone, HYDROmorphone (DILAUDID) injection, HYDROmorphone (DILAUDID) injection, lactated ringers, midazolam, ondansetron (ZOFRAN) IV, sodium chloride flush, sodium chloride flush   Assessment/Plan:     1. Shock: Cardiogenic/vasoplegic/hemorrhagic post-CABG with acute lung injury. Central cannulation for VA ECMO, ascending aorta and RA.  Impella 5.5 for venting (ascending aorta). Goal to eventually wean ECMO to Impella support alone.  Currently on On NE 3 Epi 2 VP 0.03  Milrinone 0.375 - Titrate pressors for MAP >65.  - Follow co-ox - Continue volume and blood product support. Get CVP to 10-12 - Continue to push ECMO flows as tolerated - Impella at P3, flow 0.4 Position stable  - Only on heparin in purge due to bleeding 2. Acute blood loss Anemia/coagulopathy: Multiple units PRBCs, FFP, platelets.  Chest washout at bedside.  - Frequent CBCs and product transfusion as needed. Check clotting factors - keep Hgb > 8 - consider TEG 3. Acute hypoxemic respiratory failure: ALI with pulmonary edema and pulmonary hemorrhage.   - Lung protective ventilation per CCM.  - Will eventually need diuresis/fluid removal.  - CCM managing 4. Acute on chronic systolic CHF: Ischemic cardiomyopathy.  Pre-op echo with EF 35-40%.  Intra-op echo with LV EF 25% range but good RV function.  See above.   5. Renal: Renal function suprisongly stable thus far. Continue  to follow.  6. Thrombocytopenia: Due to coagulopathy.  Replace as needed   CRITICAL CARE Performed by: Glori Bickers  Total critical care time: 45 minutes  Critical care time was exclusive of separately billable procedures and treating other patients.  Critical care was necessary to treat or prevent imminent or life-threatening deterioration.  Critical care was time spent personally by me (independent of midlevel providers or residents) on the following activities: development of treatment plan with patient and/or surrogate as well as nursing, discussions with consultants, evaluation of patient's response to treatment, examination of patient, obtaining history from patient or surrogate, ordering and performing treatments and interventions, ordering and review of laboratory studies, ordering and review of radiographic studies, pulse oximetry and re-evaluation of patient's condition.    Length of Stay: [place] 10/26/2020, 8:00 AM  Advanced Heart Failure Team Pager [phone] (M-F; 7a - 4p)  Please contact [facility] for night-coverage after hours (4p -7a ) and weekends on [email or web address]

## 2020-10-26 NOTE — Progress Notes (Signed)
[place] for heparin Indication:  Impella 5.5 + ECMO [facility]  Allergies  Allergen Reactions   Penicillins Other (See Comments)    Dizzy     Patient Measurements: Height: '5\' 7"'  (170.2 cm) Weight: 82.6 kg (182 lb 1.6 oz) IBW/kg (Calculated) : 61.6 Heparin Dosing Weight: 78.7 kg   Vital Signs: Temp: 97.7 F (36.5 C) (09/21 1900) Temp Source: Core (09/21 1825) BP: 82/74 (09/21 1459) Pulse Rate: 79 (09/21 1900)  Labs: Recent Labs    10/20/2020 0305 10/08/2020 0808 10/11/2020 1957 10/12/2020 1958 10/26/20 0409 10/26/20 0412 10/26/20 0803 10/26/20 0834 10/26/20 0853 10/26/20 1303 10/26/20 1318 10/26/20 1740 10/26/20 1759  HGB 10.4*   < >  --    < > 7.1*   < > 8.2*   < >  --  7.4* 7.1*  --  8.5*  HCT 31.8*   < >  --    < > 20.5*   < > 23.9*   < >  --  21.4* 21.0*  --  25.0*  PLT 207   < >  --    < > 38*  --  71*  --   --  88*  --   --   --   APTT 86*   < >  --    < > 66*  --   --   --  72* 81*  --   --   --   LABPROT 15.4*   < >  --    < > 17.5*  --   --   --  19.0* 21.6*  --   --   --   INR 1.2   < >  --    < > 1.4*  --   --   --  1.6* 1.9*  --   --   --   HEPARINUNFRC 0.44  --  <0.10*  --   --   --   --   --   --   --   --  <0.10*  --   CREATININE 1.66*   < >  --    < > 1.39*  --  1.20*  --   --  1.19*  --   --   --    < > = values in this interval not displayed.     Estimated Creatinine Clearance: 44.4 mL/min (A) (by C-G formula based on SCr of 1.19 mg/dL (H)).   Medical History: Past Medical History:  Diagnosis Date   Carotid artery occlusion    Coronary artery disease    Hyperlipidemia    Hypertension    Myocardial infarction ([place]) 1997    Medications:  Scheduled:   sodium chloride   Intravenous Once   sodium chloride   Intravenous Once   sodium chloride   Intravenous Once   sodium chloride   Intravenous Once   acetaminophen  1,000 mg Oral Q6H   Or   acetaminophen (TYLENOL) oral liquid 160 mg/5 mL  1,000 mg Per Tube Q6H    acetaminophen (TYLENOL) oral liquid 160 mg/5 mL  650 mg Per Tube Once   Or   acetaminophen  650 mg Rectal Once   aspirin EC  325 mg Oral Daily   Or   aspirin  324 mg Per Tube Daily   bisacodyl  10 mg Oral Daily   Or   bisacodyl  10 mg Rectal Daily   calcium chloride  1 g Intravenous Once   chlorhexidine  gluconate (MEDLINE KIT)  15 mL Mouth Rinse BID   Chlorhexidine Gluconate Cloth  6 each Topical Daily   docusate  100 mg Per Tube BID    HYDROmorphone (DILAUDID) injection  0.5 mg Intravenous Once   insulin aspart  0-24 Units Subcutaneous Q4H   mouth rinse  15 mL Mouth Rinse 10 times per day   pantoprazole (PROTONIX) IV  40 mg Intravenous Q24H   polyethylene glycol  17 g Per Tube Daily   rosuvastatin  40 mg Oral Daily   sodium chloride flush  10-40 mL Intracatheter Q12H   sodium chloride flush  3 mL Intravenous Q12H    Assessment: 52 yof who underwent CABG x4 complicated cardiac arrest - now has impella 5.5 and on VA-ECMO. No AC PTA.  Heparin level undetectable. Heparin purge currently at 6.9 ml/hr (345 units/hr of heparin from the purge). Noted to be still bleeding but overall appears to be slowing per RN.   Goal of Therapy:  Heparin level <0.3 units/ml Monitor platelets by anticoagulation protocol: Yes   Plan:  Continue heparin purge solution (50 units/mL) with Impella No systemic heparin at this time Monitor s/sx of bleeding - monitor heparin level with AM labs  Cristela Felt, PharmD, BCPS Clinical Pharmacist 10/26/2020 7:02 PM

## 2020-10-26 NOTE — Interval H&P Note (Signed)
History and Physical Interval Note:  10/26/2020 8:30 AM  Veronica Swanson  has presented today for surgery, with the diagnosis of post operative bleeding.  The various methods of treatment have been discussed with the patient and family. After consideration of risks, benefits and other options for treatment, the patient has consented to  Procedure(s): BEDSIDE POST OPERATIVE CORONARY ARTERY BYPASS GRAFTING EXPLORATION (N/A) as a surgical intervention.  The patient's history has been reviewed, patient examined, no change in status, stable for surgery.  I have reviewed the patient's chart and labs.  Questions were answered to the patient's satisfaction.     [name]

## 2020-10-27 ENCOUNTER — Encounter (HOSPITAL_COMMUNITY)
Admission: EM | Disposition: E | Payer: Self-pay | Source: Home / Self Care | Attending: Thoracic Surgery (Cardiothoracic Vascular Surgery)

## 2020-10-27 ENCOUNTER — Encounter (HOSPITAL_COMMUNITY): Payer: Self-pay | Admitting: Anesthesiology

## 2020-10-27 ENCOUNTER — Inpatient Hospital Stay (HOSPITAL_COMMUNITY): Payer: Medicare Other

## 2020-10-27 DIAGNOSIS — I5023 Acute on chronic systolic (congestive) heart failure: Secondary | ICD-10-CM | POA: Diagnosis not present

## 2020-10-27 DIAGNOSIS — J9601 Acute respiratory failure with hypoxia: Secondary | ICD-10-CM

## 2020-10-27 DIAGNOSIS — Z951 Presence of aortocoronary bypass graft: Secondary | ICD-10-CM | POA: Diagnosis not present

## 2020-10-27 DIAGNOSIS — R570 Cardiogenic shock: Secondary | ICD-10-CM | POA: Diagnosis not present

## 2020-10-27 DIAGNOSIS — I5020 Unspecified systolic (congestive) heart failure: Secondary | ICD-10-CM | POA: Diagnosis not present

## 2020-10-27 DIAGNOSIS — R079 Chest pain, unspecified: Secondary | ICD-10-CM

## 2020-10-27 DIAGNOSIS — I340 Nonrheumatic mitral (valve) insufficiency: Secondary | ICD-10-CM

## 2020-10-27 HISTORY — PX: STERNAL WOUND DEBRIDEMENT: SHX1058

## 2020-10-27 LAB — BASIC METABOLIC PANEL
Anion gap: 8 (ref 5–15)
Anion gap: 9 (ref 5–15)
Anion gap: 9 (ref 5–15)
Anion gap: 6 (ref 5–15)
BUN: 12 mg/dL (ref 8–23)
BUN: 8 mg/dL (ref 8–23)
BUN: 13 mg/dL (ref 8–23)
BUN: 14 mg/dL (ref 8–23)
CO2: 23 mmol/L (ref 22–32)
CO2: 23 mmol/L (ref 22–32)
CO2: 24 mmol/L (ref 22–32)
CO2: 24 mmol/L (ref 22–32)
Calcium: 6.4 mg/dL — CL (ref 8.9–10.3)
Calcium: 7 mg/dL — ABNORMAL LOW (ref 8.9–10.3)
Calcium: 6.4 mg/dL — CL (ref 8.9–10.3)
Calcium: 6.4 mg/dL — CL (ref 8.9–10.3)
Chloride: 108 mmol/L (ref 98–111)
Chloride: 109 mmol/L (ref 98–111)
Chloride: 108 mmol/L (ref 98–111)
Chloride: 111 mmol/L (ref 98–111)
Creatinine, Ser: 1.26 mg/dL — ABNORMAL HIGH (ref 0.44–1.00)
Creatinine, Ser: 1.25 mg/dL — ABNORMAL HIGH (ref 0.44–1.00)
Creatinine, Ser: 1.32 mg/dL — ABNORMAL HIGH (ref 0.44–1.00)
Creatinine, Ser: 1.34 mg/dL — ABNORMAL HIGH (ref 0.44–1.00)
GFR, Estimated: 44 mL/min — ABNORMAL LOW (ref >60)
GFR, Estimated: 45 mL/min — ABNORMAL LOW (ref >60)
GFR, Estimated: 42 mL/min — ABNORMAL LOW (ref >60)
GFR, Estimated: 41 mL/min — ABNORMAL LOW (ref >60)
Glucose, Bld: 180 mg/dL — ABNORMAL HIGH (ref 70–99)
Glucose, Bld: 152 mg/dL — ABNORMAL HIGH (ref 70–99)
Glucose, Bld: 137 mg/dL — ABNORMAL HIGH (ref 70–99)
Glucose, Bld: 107 mg/dL — ABNORMAL HIGH (ref 70–99)
Potassium: 4.8 mmol/L (ref 3.5–5.1)
Potassium: 4.8 mmol/L (ref 3.5–5.1)
Potassium: 4.6 mmol/L (ref 3.5–5.1)
Potassium: 4.6 mmol/L (ref 3.5–5.1)
Sodium: 139 mmol/L (ref 135–145)
Sodium: 141 mmol/L (ref 135–145)
Sodium: 141 mmol/L (ref 135–145)
Sodium: 141 mmol/L (ref 135–145)

## 2020-10-27 LAB — CBC
HCT: 23.5 % — ABNORMAL LOW (ref 36.0–46.0)
HCT: 26.7 % — ABNORMAL LOW (ref 36.0–46.0)
HCT: 27 % — ABNORMAL LOW (ref 36.0–46.0)
HCT: 27.5 % — ABNORMAL LOW (ref 36.0–46.0)
Hemoglobin: 8.1 g/dL — ABNORMAL LOW (ref 12.0–15.0)
Hemoglobin: 9.2 g/dL — ABNORMAL LOW (ref 12.0–15.0)
Hemoglobin: 9.6 g/dL — ABNORMAL LOW (ref 12.0–15.0)
Hemoglobin: 9.8 g/dL — ABNORMAL LOW (ref 12.0–15.0)
MCH: 29.5 pg (ref 26.0–34.0)
MCH: 29.8 pg (ref 26.0–34.0)
MCH: 30.2 pg (ref 26.0–34.0)
MCH: 30.5 pg (ref 26.0–34.0)
MCHC: 34.5 g/dL (ref 30.0–36.0)
MCHC: 34.5 g/dL (ref 30.0–36.0)
MCHC: 35.6 g/dL (ref 30.0–36.0)
MCHC: 35.6 g/dL (ref 30.0–36.0)
MCV: 84.6 fL (ref 80.0–100.0)
MCV: 85.5 fL (ref 80.0–100.0)
MCV: 85.7 fL (ref 80.0–100.0)
MCV: 86.4 fL (ref 80.0–100.0)
Platelets: 45 10*3/uL — ABNORMAL LOW (ref 150–400)
Platelets: 54 10*3/uL — ABNORMAL LOW (ref 150–400)
Platelets: 57 10*3/uL — ABNORMAL LOW (ref 150–400)
Platelets: 64 10*3/uL — ABNORMAL LOW (ref 150–400)
RBC: 2.75 MIL/uL — ABNORMAL LOW (ref 3.87–5.11)
RBC: 3.09 MIL/uL — ABNORMAL LOW (ref 3.87–5.11)
RBC: 3.15 MIL/uL — ABNORMAL LOW (ref 3.87–5.11)
RBC: 3.25 MIL/uL — ABNORMAL LOW (ref 3.87–5.11)
RDW: 14.7 % (ref 11.5–15.5)
RDW: 15 % (ref 11.5–15.5)
RDW: 15 % (ref 11.5–15.5)
RDW: 15.2 % (ref 11.5–15.5)
WBC: 3.6 10*3/uL — ABNORMAL LOW (ref 4.0–10.5)
WBC: 3.8 10*3/uL — ABNORMAL LOW (ref 4.0–10.5)
WBC: 4.9 10*3/uL (ref 4.0–10.5)
WBC: 4.9 10*3/uL (ref 4.0–10.5)
nRBC: 0.6 % — ABNORMAL HIGH (ref 0.0–0.2)
nRBC: 0.8 % — ABNORMAL HIGH (ref 0.0–0.2)
nRBC: 0.8 % — ABNORMAL HIGH (ref 0.0–0.2)
nRBC: 1.1 % — ABNORMAL HIGH (ref 0.0–0.2)

## 2020-10-27 LAB — COOXEMETRY PANEL
Carboxyhemoglobin: 1.9 % — ABNORMAL HIGH (ref 0.5–1.5)
Methemoglobin: 1 % (ref 0.0–1.5)
O2 Saturation: 84.8 %
Total hemoglobin: 8.5 g/dL — ABNORMAL LOW (ref 12.0–16.0)

## 2020-10-27 LAB — POCT I-STAT 7, (LYTES, BLD GAS, ICA,H+H)
Acid-Base Excess: 2 mmol/L (ref 0.0–2.0)
Acid-Base Excess: 1 mmol/L (ref 0.0–2.0)
Acid-Base Excess: 3 mmol/L — ABNORMAL HIGH (ref 0.0–2.0)
Bicarbonate: 27.4 mmol/L (ref 20.0–28.0)
Bicarbonate: 26.6 mmol/L (ref 20.0–28.0)
Bicarbonate: 28.6 mmol/L — ABNORMAL HIGH (ref 20.0–28.0)
Calcium, Ion: 1.04 mmol/L — ABNORMAL LOW (ref 1.15–1.40)
Calcium, Ion: 1 mmol/L — ABNORMAL LOW (ref 1.15–1.40)
Calcium, Ion: 0.96 mmol/L — ABNORMAL LOW (ref 1.15–1.40)
HCT: 22 % — ABNORMAL LOW (ref 36.0–46.0)
HCT: 23 % — ABNORMAL LOW (ref 36.0–46.0)
HCT: 25 % — ABNORMAL LOW (ref 36.0–46.0)
Hemoglobin: 7.5 g/dL — ABNORMAL LOW (ref 12.0–15.0)
Hemoglobin: 7.8 g/dL — ABNORMAL LOW (ref 12.0–15.0)
Hemoglobin: 8.5 g/dL — ABNORMAL LOW (ref 12.0–15.0)
O2 Saturation: 100 %
O2 Saturation: 100 %
O2 Saturation: 100 %
Patient temperature: 36.4
Patient temperature: 36.5
Potassium: 4.8 mmol/L (ref 3.5–5.1)
Potassium: 4.9 mmol/L (ref 3.5–5.1)
Potassium: 4.5 mmol/L (ref 3.5–5.1)
Sodium: 142 mmol/L (ref 135–145)
Sodium: 142 mmol/L (ref 135–145)
Sodium: 143 mmol/L (ref 135–145)
TCO2: 29 mmol/L (ref 22–32)
TCO2: 28 mmol/L (ref 22–32)
TCO2: 30 mmol/L (ref 22–32)
pCO2 arterial: 44.1 mmHg (ref 32.0–48.0)
pCO2 arterial: 47.4 mmHg (ref 32.0–48.0)
pCO2 arterial: 48.1 mmHg — ABNORMAL HIGH (ref 32.0–48.0)
pH, Arterial: 7.397 (ref 7.350–7.450)
pH, Arterial: 7.358 (ref 7.350–7.450)
pH, Arterial: 7.379 (ref 7.350–7.450)
pO2, Arterial: 503 mmHg — ABNORMAL HIGH (ref 83.0–108.0)
pO2, Arterial: 446 mmHg — ABNORMAL HIGH (ref 83.0–108.0)
pO2, Arterial: 510 mmHg — ABNORMAL HIGH (ref 83.0–108.0)

## 2020-10-27 LAB — BPAM PLATELET PHERESIS
Blood Product Expiration Date: 202209232359
ISSUE DATE / TIME: 202209210832
Unit Type and Rh: 6200

## 2020-10-27 LAB — BPAM FFP
Blood Product Expiration Date: 202209252359
Blood Product Expiration Date: 202209252359
ISSUE DATE / TIME: 202209211722
ISSUE DATE / TIME: 202209211722
Unit Type and Rh: 6200
Unit Type and Rh: 6200

## 2020-10-27 LAB — APTT
aPTT: 61 seconds — ABNORMAL HIGH (ref 24–36)
aPTT: 63 seconds — ABNORMAL HIGH (ref 24–36)
aPTT: 47 seconds — ABNORMAL HIGH (ref 24–36)
aPTT: 46 seconds — ABNORMAL HIGH (ref 24–36)

## 2020-10-27 LAB — HEPATIC FUNCTION PANEL
ALT: 16 U/L (ref 0–44)
AST: 37 U/L (ref 15–41)
Albumin: 2.4 g/dL — ABNORMAL LOW (ref 3.5–5.0)
Alkaline Phosphatase: 21 U/L — ABNORMAL LOW (ref 38–126)
Bilirubin, Direct: 0.6 mg/dL — ABNORMAL HIGH (ref 0.0–0.2)
Indirect Bilirubin: 1.1 mg/dL — ABNORMAL HIGH (ref 0.3–0.9)
Total Bilirubin: 1.7 mg/dL — ABNORMAL HIGH (ref 0.3–1.2)
Total Protein: 3.8 g/dL — ABNORMAL LOW (ref 6.5–8.1)

## 2020-10-27 LAB — PREPARE FRESH FROZEN PLASMA
Unit division: 0
Unit division: 0

## 2020-10-27 LAB — PREPARE RBC (CROSSMATCH)

## 2020-10-27 LAB — GLOBAL TEG PANEL
CFF Max Amplitude: 17.5 mm (ref 15–32)
CK with Heparinase (R): 8.8 min — ABNORMAL HIGH (ref 4.3–8.3)
Citrated Functional Fibrinogen: 319.3 mg/dL (ref 278–581)
Citrated Kaolin (K): 2.9 min — ABNORMAL HIGH (ref 0.8–2.1)
Citrated Kaolin (MA): 46.3 mm — ABNORMAL LOW (ref 52–69)
Citrated Kaolin (R): 13 min — ABNORMAL HIGH (ref 4.6–9.1)
Citrated Kaolin Angle: 54 deg — ABNORMAL LOW (ref 63–78)
Citrated Rapid TEG (MA): 46.7 mm — ABNORMAL LOW (ref 52–70)

## 2020-10-27 LAB — PROTIME-INR
INR: 1.6 — ABNORMAL HIGH (ref 0.8–1.2)
INR: 1.7 — ABNORMAL HIGH (ref 0.8–1.2)
INR: 1.3 — ABNORMAL HIGH (ref 0.8–1.2)
INR: 1.4 — ABNORMAL HIGH (ref 0.8–1.2)
Prothrombin Time: 18.9 seconds — ABNORMAL HIGH (ref 11.4–15.2)
Prothrombin Time: 19.6 seconds — ABNORMAL HIGH (ref 11.4–15.2)
Prothrombin Time: 15.9 seconds — ABNORMAL HIGH (ref 11.4–15.2)
Prothrombin Time: 17.2 seconds — ABNORMAL HIGH (ref 11.4–15.2)

## 2020-10-27 LAB — FIBRINOGEN
Fibrinogen: 305 mg/dL (ref 210–475)
Fibrinogen: 372 mg/dL (ref 210–475)
Fibrinogen: 374 mg/dL (ref 210–475)
Fibrinogen: 410 mg/dL (ref 210–475)

## 2020-10-27 LAB — LACTATE DEHYDROGENASE: LDH: 166 U/L (ref 98–192)

## 2020-10-27 LAB — GLUCOSE, CAPILLARY
Glucose-Capillary: 131 mg/dL — ABNORMAL HIGH (ref 70–99)
Glucose-Capillary: 134 mg/dL — ABNORMAL HIGH (ref 70–99)
Glucose-Capillary: 139 mg/dL — ABNORMAL HIGH (ref 70–99)
Glucose-Capillary: 144 mg/dL — ABNORMAL HIGH (ref 70–99)
Glucose-Capillary: 157 mg/dL — ABNORMAL HIGH (ref 70–99)
Glucose-Capillary: 90 mg/dL (ref 70–99)

## 2020-10-27 LAB — LACTIC ACID, PLASMA
Lactic Acid, Venous: 1.8 mmol/L (ref 0.5–1.9)
Lactic Acid, Venous: 1.6 mmol/L (ref 0.5–1.9)

## 2020-10-27 LAB — PREPARE PLATELET PHERESIS: Unit division: 0

## 2020-10-27 LAB — MAGNESIUM: Magnesium: 1.8 mg/dL (ref 1.7–2.4)

## 2020-10-27 LAB — VANCOMYCIN, TROUGH: Vancomycin Tr: 11 ug/mL — ABNORMAL LOW (ref 15–20)

## 2020-10-27 LAB — HEPARIN LEVEL (UNFRACTIONATED): Heparin Unfractionated: <0.1 IU/mL — ABNORMAL LOW (ref 0.30–0.70)

## 2020-10-27 SURGERY — DEBRIDEMENT, WOUND, STERNUM
Anesthesia: General

## 2020-10-27 MED ORDER — CALCIUM GLUCONATE-NACL 2-0.675 GM/100ML-% IV SOLN
2.0000 g | Freq: Once | INTRAVENOUS | Status: AC
  Administered 2020-10-27: 2000 mg via INTRAVENOUS
  Filled 2020-10-27: qty 100

## 2020-10-27 MED ORDER — 0.9 % SODIUM CHLORIDE (POUR BTL) OPTIME
TOPICAL | Status: DC | PRN
  Administered 2020-10-27: 1000 mL

## 2020-10-27 MED ORDER — ALBUMIN HUMAN 5 % IV SOLN
250.0000 mL | INTRAVENOUS | Status: AC | PRN
  Administered 2020-10-27 – 2020-10-28 (×3): 12.5 g via INTRAVENOUS
  Filled 2020-10-27 (×3): qty 250

## 2020-10-27 MED ORDER — SODIUM CHLORIDE 0.9% IV SOLUTION: Freq: Once | INTRAVENOUS | Status: DC

## 2020-10-27 MED ORDER — VITAL HIGH PROTEIN PO LIQD: 1000.0000 mL | ORAL | Status: DC

## 2020-10-27 MED ORDER — ALBUMIN HUMAN 5 % IV SOLN: 12.5000 g | Freq: Once | INTRAVENOUS | Status: AC

## 2020-10-27 MED ORDER — SODIUM CHLORIDE 0.9% IV SOLUTION: Freq: Once | INTRAVENOUS | Status: AC

## 2020-10-27 MED ORDER — PIVOT 1.5 CAL PO LIQD
1000.0000 mL | ORAL | Status: DC
  Administered 2020-10-28: 1000 mL

## 2020-10-27 MED ORDER — DEXMEDETOMIDINE HCL IN NACL 400 MCG/100ML IV SOLN
0.4000 ug/kg/h | INTRAVENOUS | Status: DC
Start: 2020-10-27 — End: 2020-11-02
  Administered 2020-10-27: 0.7 ug/kg/h via INTRAVENOUS
  Administered 2020-10-27: 0.4 ug/kg/h via INTRAVENOUS
  Administered 2020-10-27: 1.2 ug/kg/h via INTRAVENOUS
  Administered 2020-10-28: 0.7 ug/kg/h via INTRAVENOUS
  Filled 2020-10-27 (×2): qty 100
  Filled 2020-10-27: qty 200
  Filled 2020-10-27 (×2): qty 100

## 2020-10-27 MED ORDER — MAGNESIUM SULFATE 2 GM/50ML IV SOLN
2.0000 g | Freq: Once | INTRAVENOUS | Status: AC
  Administered 2020-10-27: 2 g via INTRAVENOUS
  Filled 2020-10-27: qty 50

## 2020-10-27 MED ORDER — HEMOSTATIC AGENTS (NO CHARGE) OPTIME
TOPICAL | Status: DC | PRN
  Administered 2020-10-27 (×3): 1 via TOPICAL

## 2020-10-27 MED ORDER — VANCOMYCIN HCL 750 MG/150ML IV SOLN
750.0000 mg | Freq: Two times a day (BID) | INTRAVENOUS | Status: DC
  Administered 2020-10-27 – 2020-10-29 (×4): 750 mg via INTRAVENOUS
  Filled 2020-10-27 (×6): qty 150

## 2020-10-27 MED FILL — Magnesium Sulfate Inj 50%: INTRAMUSCULAR | Qty: 10 | Status: AC

## 2020-10-27 MED FILL — Potassium Chloride Inj 2 mEq/ML: INTRAVENOUS | Qty: 40 | Status: AC

## 2020-10-27 MED FILL — Heparin Sodium (Porcine) Inj 1000 Unit/ML: Qty: 1000 | Status: AC

## 2020-10-27 SURGICAL SUPPLY — 48 items
BANDAGE ESMARK 6X9 LF (GAUZE/BANDAGES/DRESSINGS) ×1 IMPLANT
BANDAGE HEMOSTAT MRDH 4X4 STRL (MISCELLANEOUS) ×1 IMPLANT
BLADE CLIPPER SURG (BLADE) ×2 IMPLANT
BLADE SURG 10 STRL SS (BLADE) ×2 IMPLANT
BNDG ESMARK 6X9 LF (GAUZE/BANDAGES/DRESSINGS) ×2
BNDG HEMOSTAT MRDH 4X4 STRL (MISCELLANEOUS) ×2
CANISTER SUCT 3000ML PPV (MISCELLANEOUS) ×2 IMPLANT
CNTNR URN SCR LID CUP LEK RST (MISCELLANEOUS) IMPLANT
CONT SPEC 4OZ STRL OR WHT (MISCELLANEOUS)
COUNTER NEEDLE 20 DBL MAG RED (NEEDLE) ×2 IMPLANT
DRAPE INCISE IOBAN 66X45 STRL (DRAPES) ×2 IMPLANT
DRAPE LAPAROSCOPIC ABDOMINAL (DRAPES) ×2 IMPLANT
DRAPE WARM FLUID 44X44 (DRAPES) IMPLANT
ELECT REM PT RETURN 9FT ADLT (ELECTROSURGICAL) ×2
ELECTRODE REM PT RTRN 9FT ADLT (ELECTROSURGICAL) ×1 IMPLANT
GAUZE SPONGE 4X4 12PLY STRL (GAUZE/BANDAGES/DRESSINGS) ×6 IMPLANT
GLOVE SURG SIGNA 7.5 PF LTX (GLOVE) ×4 IMPLANT
GOWN STRL REUS W/ TWL LRG LVL3 (GOWN DISPOSABLE) ×2 IMPLANT
GOWN STRL REUS W/ TWL XL LVL3 (GOWN DISPOSABLE) ×1 IMPLANT
GOWN STRL REUS W/TWL LRG LVL3 (GOWN DISPOSABLE) ×4
GOWN STRL REUS W/TWL XL LVL3 (GOWN DISPOSABLE) ×2
HANDPIECE INTERPULSE COAX TIP (DISPOSABLE)
KIT BASIN OR (CUSTOM PROCEDURE TRAY) ×2 IMPLANT
KIT TOURNIQUET VASCULAR (KITS) ×2 IMPLANT
KIT TURNOVER KIT B (KITS) ×2 IMPLANT
NS IRRIG 1000ML POUR BTL (IV SOLUTION) ×4 IMPLANT
PACK CHEST (CUSTOM PROCEDURE TRAY) ×2 IMPLANT
PACK SURGICAL SETUP 50X90 (CUSTOM PROCEDURE TRAY) ×2 IMPLANT
PAD ARMBOARD 7.5X6 YLW CONV (MISCELLANEOUS) ×4 IMPLANT
PENCIL BUTTON HOLSTER BLD 10FT (ELECTRODE) ×2 IMPLANT
SEALANT PATCH FIBRIN 2X4IN (MISCELLANEOUS) ×4 IMPLANT
SET HNDPC FAN SPRY TIP SCT (DISPOSABLE) IMPLANT
SPONGE T-LAP 18X18 ~~LOC~~+RFID (SPONGE) ×4 IMPLANT
SPONGE T-LAP 4X18 ~~LOC~~+RFID (SPONGE) ×2 IMPLANT
SUT PROLENE 2 0 MH 48 (SUTURE) ×2 IMPLANT
SUT PROLENE 5 0 C 1 36 (SUTURE) ×4 IMPLANT
SUT SILK 1 TIES 10X30 (SUTURE) ×2 IMPLANT
SUT STEEL STERNAL CCS#1 18IN (SUTURE) IMPLANT
SUT VIC AB 3-0 X1 27 (SUTURE) ×4 IMPLANT
SWAB COLLECTION DEVICE MRSA (MISCELLANEOUS) IMPLANT
SWAB CULTURE ESWAB REG 1ML (MISCELLANEOUS) IMPLANT
SYR 5ML LL (SYRINGE) IMPLANT
SYR BULB IRRIG 60ML STRL (SYRINGE) ×2 IMPLANT
TOWEL GREEN STERILE (TOWEL DISPOSABLE) ×2 IMPLANT
TOWEL GREEN STERILE FF (TOWEL DISPOSABLE) ×2 IMPLANT
TUBE CONNECTING 20X1/4 (TUBING) ×2 IMPLANT
WATER STERILE IRR 1000ML POUR (IV SOLUTION) ×2 IMPLANT
YANKAUER SUCT BULB TIP NO VENT (SUCTIONS) ×2 IMPLANT

## 2020-10-27 NOTE — Progress Notes (Addendum)
Patient ID: Veronica Swanson, female   DOB: [birth year], 75 y.o.   MRN: [id number]    Advanced Heart Failure Rounding Note   Subjective:    On central VA ECMO with Impella at P-3 for vent  Intubated. Sedated. Chest open.  Still with bleeding in chest, received multiple blood products.    On epinerphrine 5, VP 0.03, Milrinone 0.375. SvO2 85%.   NSR in 50s with PACs, lost capture of atrial lead.   ECMO  Speed 2500 rpm Flow 2.76 L/min pVenous -6 DeltaP 10 Sweep 3  Impella P3, flow 0.8 L/min  Making urine  Scr 1.39 => 1.25  Hgb 8.1 Platelets 45   Objective:   Weight Range:  Vital Signs:   Temp:  [97.3 F (36.3 C)-97.7 F (36.5 C)] 97.5 F (36.4 C) (09/22 0700) Pulse Rate:  [44-83] 80 (09/22 0344) Resp:  [0-23] 0 (09/22 0700) BP: (82-89)/(74-81) 89/81 (09/22 0344) SpO2:  [100 %] 100 % (09/22 0400) Arterial Line BP: (70-99)/(66-92) 81/76 (09/22 0700) FiO2 (%):  [40 %] 40 % (09/22 0344) Weight:  [85.9 kg] 85.9 kg (09/22 0415) Last BM Date: 10/23/20  Weight change: Filed Weights   10/09/2020 1600 10/26/20 0500 10/14/2020 0415  Weight: 68 kg 82.6 kg 85.9 kg    Intake/Output:   Intake/Output Summary (Last 24 hours) at 10/14/2020 0738 Last data filed at 10/18/2020 0700 Gross per 24 hour  Intake 8993.1 ml  Output 4185 ml  Net 4808.1 ml     Physical Exam: General: Intubated/sedated Neck: No JVD, no thyromegaly or thyroid nodule.  Lungs: Decreased bilaterally.  CV: Chest open Abdomen: Soft, nontender, no hepatosplenomegaly, no distention.  Skin: Intact without lesions or rashes.  Neurologic: Sedated Extremities: No clubbing or cyanosis.  HEENT: Normal.    Telemetry: Sinus 50s with PACs Personally reviewed   Labs: Basic Metabolic Panel: Recent Labs  Lab 10/26/20 0409 10/26/20 0412 10/26/20 0803 10/26/20 0834 10/26/20 1303 10/26/20 1318 10/26/20 1954 10/26/20 2018 10/26/20 2358 10/07/2020 0418 10/13/2020 0419  NA 144   < > 142   < > 141   < > 141 144  139 142 141  K 3.3*   < > 3.8   < > 4.0   < > 5.0 4.6 4.8 4.8 4.8  CL 106  --  107  --  108  --  108  --  108  --  109  CO2 23  --  24  --  24  --  24  --  23  --  23  GLUCOSE 164*  --  112*  --  136*  --  182*  --  180*  --  152*  BUN 10  --  10  --  10  --  10  --  12  --  8  CREATININE 1.39*  --  1.20*  --  1.19*  --  1.29*  --  1.26*  --  1.25*  CALCIUM 7.1*  --  6.6*  --  6.6*  --  6.7*  --  6.4*  --  7.0*  MG 1.7  --   --   --   --   --   --   --   --   --  1.8   < > = values in this interval not displayed.    Liver Function Tests: Recent Labs  Lab 10/23/2020 1333 10/26/2020 1958 10/26/20 0409 10/11/2020 0419  AST 24 97* 63* 37  ALT _0 ALKPHOS  25* 16* 25* 21*  BILITOT 0.5 1.3* 1.1 1.7*  PROT 7.3 3.6* 3.7* 3.8*  ALBUMIN 3.9 2.6* 2.2* 2.4*   No results for input(s): LIPASE, AMYLASE in the last 168 hours. No results for input(s): AMMONIA in the last 168 hours.  CBC: Recent Labs  Lab 11/04/2020 1333 10/22/20 0051 10/24/2020 2328 11/03/2020 2330 10/26/20 0803 10/26/20 0834 10/26/20 1303 10/26/20 1318 10/26/20 1954 10/26/20 2018 10/26/20 2358 10/20/2020 0418 10/26/2020 0519  WBC 4.4   < > 2.7*   < > 3.5*  --  3.2*  --  4.1  --  4.9  --  4.9  NEUTROABS 1.5*  --  2.1  --   --   --   --   --   --   --   --   --   --   HGB 13.0   < > 7.3*   < > 8.2*   < > 7.4*   < > 9.9* 7.5* 9.8* 7.5* 8.1*  HCT 39.5   < > 22.4*   < > 23.9*   < > 21.4*   < > 27.7* 22.0* 27.5* 22.0* 23.5*  MCV 85.9   < > 82.7   < > 83.6  --  82.9  --  83.2  --  84.6  --  85.5  PLT 264   < > 19*   < > 71*  --  88*  --  61*  --  54*  --  45*   < > = values in this interval not displayed.    Cardiac Enzymes: No results for input(s): CKTOTAL, CKMB, CKMBINDEX, TROPONINI in the last 168 hours.  BNP: BNP (last 3 results) Recent Labs    10/22/20 0954  BNP 1,154.2*    ProBNP (last 3 results) No results for input(s): PROBNP in the last 8760 hours.    Other results:  Imaging: DG Chest Port 1  View  Result Date: 10/26/2020 CLINICAL DATA:  Status post cardiac surgery.  On ECMO. EXAM: PORTABLE CHEST 1 VIEW COMPARISON:  October 25, 2020. FINDINGS: Stable position of endotracheal and nasogastric tubes. ECMO tubing is noted. Impella device is also noted. Right internal jugular Swan-Ganz catheter is seen directed toward left pulmonary artery. Bilateral chest tubes are noted without pneumothorax. Bilateral lung opacities are concerning for edema and probable bibasilar atelectasis. Bony thorax unremarkable. IMPRESSION: Stable support apparatus including ECMO tubing and Impella device. Bilateral lung opacities are noted concerning for edema and possible bibasilar atelectasis. Bilateral chest tubes are noted without pneumothorax. Electronically Signed   By: Marijo Conception M.D.   On: 10/26/2020 08:27   DG Chest Port 1 View  Result Date: 11/03/2020 CLINICAL DATA:  Status post cardiac surgery. EXAM: PORTABLE CHEST 1 VIEW COMPARISON:  Chest x-ray 10/20/2020. FINDINGS: Endotracheal tube tip is approximately 3.6 cm above the carina. Swan-Ganz catheter tip projects over the main pulmonary artery. Enteric tube extends below the diaphragm into the stomach, its distal tip is not included on the image. ECMO catheters are present. Mediastinal drain is present. There are diffuse bilateral lung infiltrates. Costophrenic angles are clear. There is no definite pneumothorax. Artifact likely overlies the right lung apex. The cardiomediastinal silhouette appears stable. There are atherosclerotic calcifications of the aorta. No acute fractures are seen. IMPRESSION: 1. Diffuse bilateral airspace disease worrisome for edema, hemorrhage or infection. 2. Lines and tubes as above. Electronically Signed   By: Ronney Asters M.D.   On: 11/02/2020 17:45   DG C-Arm 1-60 Min-No Report  Result Date: 10/28/2020 Fluoroscopy was utilized by the requesting physician.  No radiographic interpretation.   DG C-Arm 1-60 Min-No  Report  Result Date: 10/10/2020 Fluoroscopy was utilized by the requesting physician.  No radiographic interpretation.     Medications:     Scheduled Medications:  sodium chloride   Intravenous Once   sodium chloride   Intravenous Once   sodium chloride   Intravenous Once   sodium chloride   Intravenous Once   sodium chloride   Intravenous Once   acetaminophen  1,000 mg Oral Q6H   Or   acetaminophen (TYLENOL) oral liquid 160 mg/5 mL  1,000 mg Per Tube Q6H   acetaminophen (TYLENOL) oral liquid 160 mg/5 mL  650 mg Per Tube Once   Or   acetaminophen  650 mg Rectal Once   aspirin EC  325 mg Oral Daily   Or   aspirin  324 mg Per Tube Daily   bisacodyl  10 mg Oral Daily   Or   bisacodyl  10 mg Rectal Daily   chlorhexidine gluconate (MEDLINE KIT)  15 mL Mouth Rinse BID   Chlorhexidine Gluconate Cloth  6 each Topical Daily   docusate  100 mg Per Tube BID    HYDROmorphone (DILAUDID) injection  0.5 mg Intravenous Once   insulin aspart  0-24 Units Subcutaneous Q4H   mouth rinse  15 mL Mouth Rinse 10 times per day   pantoprazole (PROTONIX) IV  40 mg Intravenous Q24H   polyethylene glycol  17 g Per Tube Daily   rosuvastatin  40 mg Oral Daily   sodium chloride flush  10-40 mL Intracatheter Q12H   sodium chloride flush  3 mL Intravenous Q12H    Infusions:  sodium chloride     sodium chloride     sodium chloride     sodium chloride     sodium chloride     sodium chloride 100 mL/hr at 10/17/2020 0700   albumin human 100 mL/hr at 10/06/2020 0700   cefUROXime (ZINACEF)  IV Stopped (10/17/2020 0545)   dexmedetomidine (PRECEDEX) IV infusion     DOPamine     epinephrine 5 mcg/min (10/20/2020 0700)   heparin 50 units/mL (Impella PURGE) in dextrose 5 % 1000 mL bag     HYDROmorphone 3 mg/hr (10/29/2020 0700)   lactated ringers     lactated ringers 20 mL/hr at 10/26/20 0100   lactated ringers 50 mL/hr at 10/20/2020 2219   magnesium sulfate bolus IVPB     midazolam 3 mg/hr (10/15/2020 0700)    milrinone 0.375 mcg/kg/min (10/26/2020 0700)   nitroGLYCERIN     norepinephrine (LEVOPHED) Adult infusion Stopped (10/26/20 0049)   phenylephrine (NEO-SYNEPHRINE) Adult infusion     vancomycin Stopped (10/26/20 2048)   vasopressin 0.03 Units/min (10/15/2020 0700)    PRN Medications: sodium chloride, albumin human, HYDROmorphone, HYDROmorphone (DILAUDID) injection, HYDROmorphone (DILAUDID) injection, lactated ringers, midazolam, ondansetron (ZOFRAN) IV, sodium chloride flush, sodium chloride flush   Assessment/Plan:     1. Shock: Cardiogenic/vasoplegic/hemorrhagic post-CABG with acute lung injury. Central cannulation for VA ECMO, ascending aorta and RA.  Impella 5.5 for venting (ascending aorta). Goal to eventually wean ECMO to Impella support alone.  Currently on epinephrine 5, VP 0.03, milrinone 0.375. SvO2 85%.  I>>O, weight up 7 lbs.  Still with good UOP and creatinine 1.25. LDH stable.  - Titrate pressors for MAP >65.  - Send lactate this morning.  - Continue volume and blood product support. Goal CVP to 10-12 - ECMO circuit stable,  no systemic heparin.  - Impella at P3, flow 0.8, no alarms.  Heparin in purge only.   - Hold diuresis with ongoing bleeding.  2. Acute blood loss Anemia/coagulopathy: Multiple units PRBCs, FFP, platelets.  Has had chest washout x 2.  Still with bleeding in chest,have lost capture of atrial lead.  - Frequent CBCs and product transfusion as needed.  - keep Hgb > 8 - Appears to need chest washout this morning, surgery to see.  3. Acute hypoxemic respiratory failure: ALI with pulmonary edema and pulmonary hemorrhage.  CXR with bilateral pulmonary edema.  - Lung protective ventilation per CCM.  - Will eventually need diuresis/fluid removal. Hold today with ongoing bleeding.  - CCM managing 4. Acute on chronic systolic CHF: Ischemic cardiomyopathy.  Pre-op echo with EF 35-40%.  Intra-op echo with LV EF 25% range but good RV function.  See above.   - TEE today  after chest washout.  5. Renal: Renal function stable thus far. Continue to follow.  6. Thrombocytopenia: Due to coagulopathy.  Replace as needed 7.  FEN: Trickle feeds.    CRITICAL CARE Performed by: Loralie Champagne  Total critical care time: 45 minutes  Critical care time was exclusive of separately billable procedures and treating other patients.  Critical care was necessary to treat or prevent imminent or life-threatening deterioration.  Critical care was time spent personally by me (independent of midlevel providers or residents) on the following activities: development of treatment plan with patient and/or surrogate as well as nursing, discussions with consultants, evaluation of patient's response to treatment, examination of patient, obtaining history from patient or surrogate, ordering and performing treatments and interventions, ordering and review of laboratory studies, ordering and review of radiographic studies, pulse oximetry and re-evaluation of patient's condition.    Length of Stay: [place] 10/12/2020, 7:38 AM  Advanced Heart Failure Team Pager [phone] (M-F; 7a - 4p)  Please contact [facility] for night-coverage after hours (4p -7a ) and weekends on [email or web address]

## 2020-10-27 NOTE — Progress Notes (Signed)
Patient ID: Veronica Swanson, female   DOB: 04-13-1945, 75 y.o.   MRN: [id number] Extracorporeal support note   ECLS support day: Indication: 2  Configuration: Central RA/Ao  Pump speed: 2500 rpm Pump flow: 2.76 L/min  Sweep gas: 3  Circuit check: stable, minimal thrombus Anticoagulant: None, only heparin in Impella purge due to ongoing bleeding Anticoagulation targets: N/A  Changes in support: None  Anticipated goals/duration of support: Wean to Impella.   Marca Ancona November 25, 2020 7:53 AM

## 2020-10-27 NOTE — CV Procedure (Signed)
Procedure: TEE  Sedation: Per CCM  Indication: Evaluation of LV/RV function on ECMO.   Findings: Please see echo section for full report.  The left ventricle is small/decompressed, EF 20%.  Impella catheter noted at 5.4 cm in the LV (good position).  The right ventricle is normal in size with moderate systolic dysfunction.  ECMO inflow cannula noted in the right atrium.  Mild mitral regurgitation.   Veronica Swanson 10/30/2020 4:16 PM

## 2020-10-27 NOTE — OR Nursing (Signed)
3 4x18s intentionally left in chest during bedside procedure today (10/23/2020).

## 2020-10-27 NOTE — Progress Notes (Signed)
Patient ID: Veronica Swanson, female   DOB: [birth year], 75 y.o.   MRN: [id number] [facility] Evening Rounds:  Hemodynamics stable on ECMO tonight after washout at bedside today. ECMO flo 4.4 L/min.    Impella at P3.  Vasopressor requirement stable. UO good CT output 110 last hr, 30 before that. Thin, bloody.  BMET    Component Value Date/Time   NA 141 10/26/2020 1624   NA 141 12/07/2019 1316   K 4.6 10/29/2020 1624   CL 108 10/26/2020 1624   CO2 24 10/10/2020 1624   GLUCOSE 137 (H) 10/13/2020 1624   BUN 13 10/20/2020 1624   BUN 20 12/07/2019 1316   CREATININE 1.32 (H) 10/25/2020 1624   CALCIUM 6.4 (LL) 10/24/2020 1624   GFRNONAA 42 (L) 10/11/2020 1624   GFRAA 48 (L) 12/07/2019 1316   CBC    Component Value Date/Time   WBC 3.8 (L) 10/10/2020 1624   RBC 3.09 (L) 10/23/2020 1624   HGB 9.2 (L) 10/28/2020 1624   HGB 11.2 12/09/2017 1142   HCT 26.7 (L) 10/26/2020 1624   HCT 33.7 (L) 12/09/2017 1142   PLT 64 (L) 10/19/2020 1624   PLT 319 12/09/2017 1142   MCV 86.4 10/11/2020 1624   MCV 82 12/09/2017 1142   MCH 29.8 10/30/2020 1624   MCHC 34.5 11/04/2020 1624   RDW 15.2 11/04/2020 1624   RDW 14.1 12/09/2017 1142   LYMPHSABS 0.4 (L) 10/06/2020 2328   LYMPHSABS 2.5 12/09/2017 1142   MONOABS 0.2 10/16/2020 2328   EOSABS 0.0 10/22/2020 2328   EOSABS 0.1 12/09/2017 1142   BASOSABS 0.0 10/10/2020 2328   BASOSABS 0.0 12/09/2017 1142   Continue present course.

## 2020-10-27 NOTE — Progress Notes (Signed)
[place] for heparin Indication:  Impella 5.5 + ECMO [facility]  Allergies  Allergen Reactions   Penicillins Other (See Comments)    Dizzy     Patient Measurements: Height: '5\' 7"'  (170.2 cm) Weight: 85.9 kg (189 lb 6 oz) IBW/kg (Calculated) : 61.6 Heparin Dosing Weight: 78.7 kg   Vital Signs: Temp: 97.5 F (36.4 C) (09/22 1145) Temp Source: Core (09/22 0754) BP: 78/75 (09/22 0754) Pulse Rate: 65 (09/22 0754)  Labs: Recent Labs    10/24/2020 1957 10/20/2020 1958 10/26/20 1740 10/26/20 1759 10/26/20 1954 10/26/20 2018 10/26/20 2358 10/16/2020 0418 10/07/2020 0419 10/29/2020 0519 10/10/2020 0730 10/20/2020 1113  HGB  --    < >  --    < > 9.9*   < > 9.8* 7.5*  --  8.1*  --  7.8*  HCT  --    < >  --    < > 27.7*   < > 27.5* 22.0*  --  23.5*  --  23.0*  PLT  --    < >  --   --  61*  --  54*  --   --  45*  --   --   APTT  --    < >  --   --  73*  --  61*  --   --   --  63*  --   LABPROT  --    < >  --   --  20.6*  --  18.9*  --   --   --  19.6*  --   INR  --    < >  --   --  1.8*  --  1.6*  --   --   --  1.7*  --   HEPARINUNFRC <0.10*  --  <0.10*  --   --   --   --   --  <0.10*  --   --   --   CREATININE  --    < >  --   --  1.29*  --  1.26*  --  1.25*  --   --   --    < > = values in this interval not displayed.     Estimated Creatinine Clearance: 43.1 mL/min (A) (by C-G formula based on SCr of 1.25 mg/dL (H)).   Medical History: Past Medical History:  Diagnosis Date   Carotid artery occlusion    Coronary artery disease    Hyperlipidemia    Hypertension    Myocardial infarction ([place]) 1997    Medications:  Scheduled:   sodium chloride   Intravenous Once   sodium chloride   Intravenous Once   sodium chloride   Intravenous Once   sodium chloride   Intravenous Once   acetaminophen  1,000 mg Oral Q6H   Or   acetaminophen (TYLENOL) oral liquid 160 mg/5 mL  1,000 mg Per Tube Q6H   acetaminophen (TYLENOL) oral liquid 160 mg/5 mL  650 mg Per Tube  Once   Or   acetaminophen  650 mg Rectal Once   aspirin EC  325 mg Oral Daily   Or   aspirin  324 mg Per Tube Daily   bisacodyl  10 mg Oral Daily   Or   bisacodyl  10 mg Rectal Daily   chlorhexidine gluconate (MEDLINE KIT)  15 mL Mouth Rinse BID   Chlorhexidine Gluconate Cloth  6 each Topical Daily   docusate  100 mg Per Tube BID  HYDROmorphone (DILAUDID) injection  0.5 mg Intravenous Once   insulin aspart  0-24 Units Subcutaneous Q4H   mouth rinse  15 mL Mouth Rinse 10 times per day   pantoprazole (PROTONIX) IV  40 mg Intravenous Q24H   polyethylene glycol  17 g Per Tube Daily   rosuvastatin  40 mg Oral Daily   sodium chloride flush  10-40 mL Intracatheter Q12H   sodium chloride flush  3 mL Intravenous Q12H    Assessment: 13 yof who underwent CABG x4 complicated cardiac arrest - now has impella 5.5 and on VA-ECMO. No AC PTA.  VA-ECMO with flow rate ~2.66 L, sweep 3.5 L. Impella 5.5 running at P-3 - purge flow 7 mL/hr (350 units/hr of heparin from purge solution). Purge pressure 500s. Still bleeding however improving.   Goal of Therapy:  Heparin level <0.3 units/ml Monitor platelets by anticoagulation protocol: Yes   Plan:  Continue heparin purge solution (50 units/mL) with Impella No systemic heparin at this time Monitor s/sx of bleeding - monitor heparin level with AM labs Monitoring with TEGs  Cathrine Muster, PharmD PGY2 Cardiology Pharmacy Resident Phone: [phone] 10/07/2020  11:50 AM  Please check [email or web address] for unit-specific pharmacy phone numbers.

## 2020-10-27 NOTE — Progress Notes (Signed)
eLink Physician-Brief Progress Note Patient Name: Veronica Swanson DOB: Jun 24, 1945 MRN: [id number]   Date of Service  10/16/2020  HPI/Events of Note  Calcium = 6.4 which corrects to 7.84 (Low) given albumin = 2.2.  eICU Interventions  Will replace Ca++.     Intervention Category Major Interventions: Electrolyte abnormality - evaluation and management  Veronica Swanson 10/13/2020, 1:59 AM

## 2020-10-27 NOTE — Progress Notes (Signed)
[facility] DAILY ICU PROGRESS NOTE                   [address]            [place] [address]          [phone]   2 Days Post-Op Procedure(s) (LRB): BEDSIDE POST OPERATIVE CORONARY ARTERY BYPASS GRAFTING EXPLORATION (N/A)  Total Length of Stay:  LOS: 6 days   Subjective: Sedated on vent/ECMO  Objective: Vital signs in last 24 hours: Temp:  [97.3 F (36.3 C)-97.7 F (36.5 C)] 97.5 F (36.4 C) (09/22 0700) Pulse Rate:  [44-83] 80 (09/22 0344) Cardiac Rhythm: Atrial paced (09/22 0400) Resp:  [0-23] 0 (09/22 0700) BP: (82-89)/(74-81) 89/81 (09/22 0344) SpO2:  [100 %] 100 % (09/22 0400) Arterial Line BP: (70-99)/(66-92) 81/76 (09/22 0700) FiO2 (%):  [40 %] 40 % (09/22 0344) Weight:  [85.9 kg] 85.9 kg (09/22 0415)  Filed Weights   10/06/2020 1600 10/26/20 0500 10/06/2020 0415  Weight: 68 kg 82.6 kg 85.9 kg    Weight change: 3.3 kg   Hemodynamic parameters for last 24 hours: PAP: (5-14)/(0-8) 12/6  Intake/Output from previous day: 09/21 0701 - 09/22 0700 In: 8993.1 [I.V.:3766.3; Blood:2762.4; IV Piggyback:2299.6] Out: 0962 [Urine:3345; Chest Tube:840]  Intake/Output this shift: No intake/output data recorded.  Current Meds: Scheduled Meds:  sodium chloride   Intravenous Once   sodium chloride   Intravenous Once   sodium chloride   Intravenous Once   sodium chloride   Intravenous Once   sodium chloride   Intravenous Once   acetaminophen  1,000 mg Oral Q6H   Or   acetaminophen (TYLENOL) oral liquid 160 mg/5 mL  1,000 mg Per Tube Q6H   acetaminophen (TYLENOL) oral liquid 160 mg/5 mL  650 mg Per Tube Once   Or   acetaminophen  650 mg Rectal Once   aspirin EC  325 mg Oral Daily   Or   aspirin  324 mg Per Tube Daily   bisacodyl  10 mg Oral Daily   Or   bisacodyl  10 mg Rectal Daily   chlorhexidine gluconate (MEDLINE KIT)  15 mL Mouth Rinse BID   Chlorhexidine Gluconate Cloth  6 each Topical Daily   docusate  100 mg Per Tube BID    HYDROmorphone  (DILAUDID) injection  0.5 mg Intravenous Once   insulin aspart  0-24 Units Subcutaneous Q4H   mouth rinse  15 mL Mouth Rinse 10 times per day   pantoprazole (PROTONIX) IV  40 mg Intravenous Q24H   polyethylene glycol  17 g Per Tube Daily   rosuvastatin  40 mg Oral Daily   sodium chloride flush  10-40 mL Intracatheter Q12H   sodium chloride flush  3 mL Intravenous Q12H   Continuous Infusions:  sodium chloride     sodium chloride     sodium chloride     sodium chloride     sodium chloride     sodium chloride 100 mL/hr at 10/09/2020 0700   albumin human 100 mL/hr at 10/13/2020 0700   amiodarone 60 mg/hr (10/24/2020 0700)   cefUROXime (ZINACEF)  IV Stopped (10/26/2020 0545)   dexmedetomidine (PRECEDEX) IV infusion 0.7 mcg/kg/hr (10/12/2020 0700)   DOPamine     epinephrine 5 mcg/min (10/26/2020 0700)   heparin 50 units/mL (Impella PURGE) in dextrose 5 % 1000 mL bag     HYDROmorphone 3 mg/hr (10/14/2020 0700)   lactated ringers     lactated ringers 20 mL/hr  at 10/26/20 0100   lactated ringers 50 mL/hr at 10/20/2020 2219   magnesium sulfate bolus IVPB     midazolam 3 mg/hr (10/26/2020 0700)   milrinone 0.375 mcg/kg/min (10/11/2020 0700)   nitroGLYCERIN     norepinephrine (LEVOPHED) Adult infusion Stopped (10/26/20 0049)   phenylephrine (NEO-SYNEPHRINE) Adult infusion     vancomycin Stopped (10/26/20 2048)   vasopressin 0.03 Units/min (10/29/2020 0700)   PRN Meds:.sodium chloride, albumin human, HYDROmorphone, HYDROmorphone (DILAUDID) injection, HYDROmorphone (DILAUDID) injection, lactated ringers, midazolam, ondansetron (ZOFRAN) IV, sodium chloride flush, sodium chloride flush  Neurologic: edated on vent Heart: difficult to assess Lungs: difiult to assess Abdomen: soft, non distended or rigid Extremities: cool/perfused  Lab Results: CBC: Recent Labs    10/26/20 2358 10/11/2020 0418 10/10/2020 0519  WBC 4.9  --  4.9  HGB 9.8* 7.5* 8.1*  HCT 27.5* 22.0* 23.5*  PLT 54*  --  45*   BMET:  Recent  Labs    10/26/20 2358 10/24/2020 0418 10/24/2020 0419  NA 139 142 141  K 4.8 4.8 4.8  CL 108  --  109  CO2 23  --  23  GLUCOSE 180*  --  152*  BUN 12  --  8  CREATININE 1.26*  --  1.25*  CALCIUM 6.4*  --  7.0*    CMET: Lab Results  Component Value Date   WBC 4.9 10/20/2020   HGB 8.1 (L) 10/18/2020   HCT 23.5 (L) 10/26/2020   PLT 45 (L) 10/14/2020   GLUCOSE 152 (H) 10/09/2020   CHOL 123 10/28/2020   TRIG 48 11/04/2020   HDL 48 10/10/2020   LDLDIRECT 70 12/07/2019   LDLCALC 65 10/24/2020   ALT 16 10/08/2020   AST 37 10/15/2020   NA 141 10/22/2020   K 4.8 10/08/2020   CL 109 11/04/2020   CREATININE 1.25 (H) 10/18/2020   BUN 8 10/12/2020   CO2 23 10/20/2020   TSH 1.31 02/29/2016   INR 1.6 (H) 10/26/2020   HGBA1C 6.1 (H) 10/09/2020   MICROALBUR 10 12/09/2017      PT/INR:  Recent Labs    10/26/20 2358  LABPROT 18.9*  INR 1.6*   Radiology: No results found.   Assessment/Plan: S/P Procedure(s) (LRB): BEDSIDE POST OPERATIVE CORONARY ARTERY BYPASS GRAFTING EXPLORATION (N/A)  1 afeb, apaced, remains on mult pressor/inotropes, norepi is off- arterial line pressures systoliic 80's, stopping amodarone- ECMO flows adeq, Co-Ox 84 2 Chest tube drainage remains sinificant- may need to consider washout today- chest is open with emark 3 good UOP- renal fxn good with creat 1.25, does not appear to have shock liver 4 vent management per CCM- oxygenating well, no acidosis currently 5 CXR difficult to access 6 conts to require transfuson - received lastnight and getting more today 7 critically ill, guarrded prognosis     John Giovanni PA-C Pager [phone] 11/04/2020 7:32 AM

## 2020-10-27 NOTE — Progress Notes (Signed)
eLink Physician-Brief Progress Note Patient Name: Veronica Swanson DOB: 06/20/1945 MRN: [id number]   Date of Service  10/19/2020  HPI/Events of Note  Hypocalcemia - Ca++ = 6.4 which corrects to 7.68 (Low) given albumin = 2.4.  eICU Interventions  Will replace Ca++.     Intervention Category Major Interventions: Electrolyte abnormality - evaluation and management  Lenell Antu 10/23/2020, 9:13 PM

## 2020-10-27 NOTE — Progress Notes (Signed)
Echocardiogram Echocardiogram Transesophageal has been performed.  Warren Lacy Romelia Bromell RDCS 10/06/2020, 4:12 PM

## 2020-10-27 NOTE — Progress Notes (Signed)
Nutrition Follow-up  DOCUMENTATION CODES:   Not applicable  INTERVENTION:   Tube Feeding via Cortrak:  Pivot 1.5 at 60 ml/hr Begin at 20 ml/hr with titration by 10 mL q 8 hours until goal rate of 60 ml/hr Provides 135 g of protein, 2160 kcals, 1094 mL of free water  NUTRITION DIAGNOSIS:   Inadequate oral intake related to acute illness as evidenced by NPO status.  Being addressed via TF   GOAL:   Patient will meet greater than or equal to 90% of their needs  Progressing  MONITOR:   Vent status, Labs, Weight trends, TF tolerance  REASON FOR ASSESSMENT:   Ventilator    ASSESSMENT:   75 yo female admitted with NSTEMI and scheduled for urgent CABG, suffered cardiac arrest post induction, flash pulmonary edema, shock, required intubation and VA ECMO cannulation. PMH includes CAD, HLD, HTN  9/16 Admitted 9/20 2vCAD s/p CABG x 4, cardiac arrest post induction, flash pulmonary edema, VA ECMO cannulation, Intubated 9/22 Chest washout  Pt remains sedated on vent, central VA ECMO with Impella. Chest remains open. Pt reports on epinephrine, vasopressin, milrinone  Cortrak team placed gastric Cortrak tube today, unable to advance post-pyloric. Plan for Radiology to attempt advancement  Chest tubes with 1560 mL in 24 hours  Current wt 85.7 kg  Labs:reviewed Meds: dulcolax, colace, lasix gtt, ss novolog, miralax, senokot   Diet Order:   Diet Order     None       EDUCATION NEEDS:   Not appropriate for education at this time  Skin:  Skin Assessment: Skin Integrity Issues: Skin Integrity Issues:: Other (Comment), Incisions Incisions: multiple chest Other: open sternum  Last BM:  9/18  Height:   Ht Readings from Last 1 Encounters:  10/26/20 5\' 7"  (1.702 m)    Weight:   Wt Readings from Last 1 Encounters:  10/10/2020 85.9 kg    Ideal Body Weight:     BMI:  Body mass index is 29.66 kg/m.  Estimated Nutritional Needs:   Kcal:  2040-2380  kcals  Protein:  120-150 g  Fluid:  >/= 1.7 L    01-02-2005 MS, RDN, LDN, CNSC Registered Dietitian III Clinical Nutrition RD Pager and On-Call Pager Number Located in [place]

## 2020-10-27 NOTE — Progress Notes (Signed)
Pharmacy Antibiotic Note  Veronica Swanson is a 75 y.o. female admitted on 10/15/2020.  Pharmacy has been consulted for vancomycin dosing for open chest and on ECMO. Vancomycin 125mg  x1 given prior to surgery. Patient on levofloxacin for PCN allergy documented as dizziness and one time doses of cefazolin given x2 today  Changing vancomycin from 500 q12h to 750 mg q 12h based upon vancomycin trough of 11 this AM. Will follow-up with vancomycin trough after 3 doses at 750 mg.  Continues on ECMO, Scr improving from 1.44 post cannulation to 1.25 today. Remains with open chest.  Plan: Vancomycin 750mg  IV q12h based VT today Continue cefuroxime 1.5g q8h Monitor renal function, cultures, and clinical progression  Height: 5\' 7"  (170.2 cm) Weight: 85.9 kg (189 lb 6 oz) IBW/kg (Calculated) : 61.6  Temp (24hrs), Avg:97.5 F (36.4 C), Min:97.3 F (36.3 C), Max:97.7 F (36.5 C)  Recent Labs  Lab 10-26-20 1958 10-26-20 2328 10/26/20 0409 10/26/20 0803 10/26/20 1303 10/26/20 1954 10/26/20 2358 10/14/2020 0419 10/15/2020 0519 10/13/2020 0730 10/19/2020 0752 10/24/2020 1104  WBC 3.9*   < > 2.1* 3.5* 3.2* 4.1 4.9  --  4.9  --   --   --   CREATININE 1.40*   < > 1.39* 1.20* 1.19* 1.29* 1.26* 1.25*  --   --   --   --   LATICACIDVEN 10.4*  --  6.2*  --   --   --   --   --   --   --  1.8 1.6  VANCOTROUGH  --   --   --   --   --   --   --   --   --  11*  --   --    < > = values in this interval not displayed.     Estimated Creatinine Clearance: 43.1 mL/min (A) (by C-G formula based on SCr of 1.25 mg/dL (H)).    Allergies  Allergen Reactions   Penicillins Other (See Comments)    Dizzy     Antimicrobials this admission: Vancomycin 9/20 >>  Cefuroxime 9/20 >>  Dose adjustments this admission: VT 11 (10/16/2020)- changed vancomycin to 750 mg q12h  Microbiology results:   Thank you for allowing pharmacy to be a part of this patient's care.  10/20, PharmD PGY2 Cardiology Pharmacy  Resident Phone: [phone] 11/03/2020  4:28 PM  Please check [email or web address] for unit-specific pharmacy phone numbers.

## 2020-10-27 NOTE — Progress Notes (Signed)
ECMO PROGRESS NOTE  NAME:  Veronica Swanson, MRN:  [id number], DOB:  [birth year], LOS: 6 ADMISSION DATE:  10/29/2020, CONSULTATION DATE:  10/15/2020 REFERRING MD:  Roxan Hockey, CHIEF COMPLAINT:  VA-ECMO   HPI/course in hospital  75 year old woman who was placed on VA-ECMO support following CABGx4.  She presented 4 days ago with CCS class 4 angina and ruled in for NSTEMI. Echo showed EF of 35% with mild mitral regurgitation and an anterior wall regional wall motion abnormalities.  Coronary catheterization revealed triple-vessel disease.  LVEDP was normal at that time.  She was scheduled for urgent CABG.  This morning she began experiencing retrosternal chest pain around the time of arrival in the operating room.  She received a standard cardiac conduction with no hypotension.  However, she became progressively hypotensive and suffered a cardiac arrest likely due to a further ischemic event.  She was urgently cannulated for cardiopulmonary bypass.  She developed severe pulmonary edema and was very difficult to oxygenate.  She underwent SL bypass with reasonable targets.  She could not separate from bypass due to pulmonary edema and poor LV function.  She was converted to [facility] ECMO and a 5 5 Impella was placed and she was transferred to the ICU.  Interim history/subjective:  Still bleeding from mediastinum Pacer stopped capturing  Objective   Blood pressure (!) 89/81, pulse 80, temperature (!) 97.5 F (36.4 C), resp. rate (!) 0, height _0  (1.702 m), weight 85.9 kg, SpO2 100 %. PAP: (5-14)/(0-8) 12/6  Vent Mode: PCV FiO2 (%):  [40 %] 40 % Set Rate:  [16 bmp] 16 bmp PEEP:  [5 cmH20] 5 cmH20 Plateau Pressure:  [14 cmH20-15 cmH20] 15 cmH20   Intake/Output Summary (Last 24 hours) at 11/02/2020 0746 Last data filed at 10/06/2020 0700 Gross per 24 hour  Intake 8993.1 ml  Output 4185 ml  Net 4808.1 ml    Filed Weights   11/04/2020 1600 10/26/20 0500 10/20/2020 0415  Weight: 68 kg  82.6 kg 85.9 kg    Examination: Intubated and heavily sedated Pupils pinpoint, reactive More oozing and swelling over sternotomy site Worsening anasarca RASS -5 Foley in place, minimal urine Not triggering vent, driving pressure looks fine (10cmH2O today)  ABG looks good Mg/Ca being repleted Lactate pending Ongoing slow H/H, plt drop CBG ok CXR worse  Assessment & Plan:  Critically ill due to cardiogenic and vasoplegic shock following CABG complicated by perioperative cardiac arrest requiring inotropic, vasopressor mechanical cardiac support and VA ECMO  Ischemic cardiomyopathy due to recent acute non-ST elevation coronary syndrome Critically ill due to acute hypoxic respiratory failure requiring mechanical ventilation and ECMO support Acute kidney injury Acute blood loss anemia Coagulopathy secondary to cardiopulmonary bypass  - Continue vent support, VAP prevention bundle, sedation titrated to RASS -4 to -5 - Adjust sweep for pH 7.35-7.45 - Ongoing blood loss precludes AC at present, work toward balanced blood product resuscitation - Impella, inotrope and ECMO pump management per CHF/TCTS discussion, getting TEE today and likely another washout - Levophed/vasopressin titrating to MAP 65 for concurrent vasoplegia from ECMO circuit and arrest - Guarded prognosis if we cannot get bleeding under control; really needs AC challenge sometime soon  Daily Goals Checklist  Pain/Anxiety/Delirium protocol (if indicated): Dilaudid and Versed infusions VAP protocol (if indicated): Bundle in place Respiratory support goals: ECMO rest setting, keep plateau pressure less than 25, current (9/22) is PEEP 5, Plat 15 Blood pressure target: MAP 70-80  DVT prophylaxis: Daily discussion until bleeding under better control Nutritional status and feeding goals: on hold for now GI prophylaxis: PPI Fluid status goals: Support intravascular volume with colloid Urinary catheter: Guide hemodynamic  management Central lines: Right IJ MAC introducer, left radial art line Glucose control: Insulin infusion by EndoTool Mobility/therapy needs: Bedrest Antibiotic de-escalation: Vancomycin and cefuroxime for open chest Home medication reconciliation: On hold Daily labs: Per ECMO protocol Code Status: Full code Family Communication: Per Dr. Roxan Hockey Disposition: ICU   Patient critically ill due to shock, resp failure Interventions to address this today vent and sedation titration Risk of deterioration without these interventions is high  I personally spent 43 minutes providing critical care not including any separately billable procedures  Erskine Emery MD [facility]  Prefer epic messenger for cross cover needs If after hours, please call E-link

## 2020-10-27 NOTE — Progress Notes (Signed)
2 Days Post-Op Procedure(s) (LRB): BEDSIDE POST OPERATIVE CORONARY ARTERY BYPASS GRAFTING EXPLORATION (N/A) Subjective: Intubated, sedated  Objective: Vital signs in last 24 hours: Temp:  [97.3 F (36.3 C)-97.7 F (36.5 C)] 97.5 F (36.4 C) (09/22 0800) Pulse Rate:  [44-83] 65 (09/22 0754) Cardiac Rhythm: Atrial paced (09/22 0400) Resp:  [0-23] 0 (09/22 0800) BP: (78-89)/(74-81) 78/75 (09/22 0754) SpO2:  [100 %] 100 % (09/22 0800) Arterial Line BP: (70-99)/(66-92) 78/74 (09/22 0800) FiO2 (%):  [40 %] 40 % (09/22 0800) Weight:  [85.9 kg] 85.9 kg (09/22 0415)  Hemodynamic parameters for last 24 hours: PAP: (5-14)/(0-8) 14/7  Intake/Output from previous day: 09/21 0701 - 09/22 0700 In: 8993.1 [I.V.:3766.3; Blood:2762.4; IV Piggyback:2299.6] Out: 4185 [Urine:3345; Chest Tube:840] Intake/Output this shift: Total I/O In: 235.6 [I.V.:167.2; Other:6.8; IV Piggyback:61.6] Out: 85 [Urine:75; Chest Tube:10]  General appearance: critically ill Neurologic: sedated Heart: difficult to hear heart sounds Lungs: rhonchi bilaterally Extremities: edematous  Lab Results: Recent Labs    10/26/20 2358 10/17/2020 0418 10/12/2020 0519  WBC 4.9  --  4.9  HGB 9.8* 7.5* 8.1*  HCT 27.5* 22.0* 23.5*  PLT 54*  --  45*   BMET:  Recent Labs    10/26/20 2358 10/20/2020 0418 10/26/2020 0419  NA 139 142 141  K 4.8 4.8 4.8  CL 108  --  109  CO2 23  --  23  GLUCOSE 180*  --  152*  BUN 12  --  8  CREATININE 1.26*  --  1.25*  CALCIUM 6.4*  --  7.0*    PT/INR:  Recent Labs    10/26/20 2358  LABPROT 18.9*  INR 1.6*   ABG    Component Value Date/Time   PHART 7.397 10/26/2020 0418   HCO3 27.4 10/23/2020 0418   TCO2 29 10/13/2020 0418   ACIDBASEDEF 3.0 (H) 11/04/2020 2330   O2SAT 84.8 10/29/2020 0419   CBG (last 3)  Recent Labs    10/26/20 1952 10/26/20 2356 10/30/2020 0416  GLUCAP 177* 157* 134*    Assessment/Plan: S/P Procedure(s) (LRB): BEDSIDE POST OPERATIVE CORONARY ARTERY  BYPASS GRAFTING EXPLORATION (N/A) Remains critically ill requiring full life support CV- VA ECMO + Impella 5.5, flows still  2.7/ 0.7  On vasopressin 0.03, milrinone 0.375, epi 5  Off norepi RESP- On ECMO  Lungs still severe edema/ ARDS pattern RENAL- creatinine stable, > 3 L UO yesterday  [place] [facility]- CBG mildly elevated GI- will need to place feeding tube HEME- bleeding has slowed down   Anemia- trasnfuse  Thrombocytopenia  Coagulopathy ID- on Vanco and Zinacef Rounds with Dr. Gala Romney and ECMO team  LOS: 6 days    [name] 10/23/2020

## 2020-10-27 NOTE — Progress Notes (Signed)
Dr. Dorris Fetch at bedside at 1400 with OR staff for washout of chest. Following blood products given emergently during case.   1U plasma unit# [id number] given at 1426, end time 1451  1U platelets unit# [id number] given at 1416, end time 1549  1U PRBCs unit# [id number] given at 1400, end time 1525  1U PRBCs unit# [id number] given at 1525, end time 1708

## 2020-10-28 ENCOUNTER — Inpatient Hospital Stay (HOSPITAL_COMMUNITY): Payer: Medicare Other

## 2020-10-28 ENCOUNTER — Encounter (HOSPITAL_COMMUNITY): Payer: Self-pay | Admitting: Thoracic Surgery (Cardiothoracic Vascular Surgery)

## 2020-10-28 DIAGNOSIS — Z951 Presence of aortocoronary bypass graft: Secondary | ICD-10-CM | POA: Diagnosis not present

## 2020-10-28 DIAGNOSIS — R570 Cardiogenic shock: Secondary | ICD-10-CM

## 2020-10-28 DIAGNOSIS — R079 Chest pain, unspecified: Secondary | ICD-10-CM | POA: Diagnosis not present

## 2020-10-28 LAB — PREPARE RBC (CROSSMATCH)

## 2020-10-28 LAB — TYPE AND SCREEN
ABO/RH(D): A POS
Antibody Screen: NEGATIVE
Unit division: 0
Unit division: 0
Unit division: 0
Unit division: 0
Unit division: 0
Unit division: 0
Unit division: 0
Unit division: 0
Unit division: 0
Unit division: 0
Unit division: 0
Unit division: 0
Unit division: 0
Unit division: 0
Unit division: 0
Unit division: 0
Unit division: 0
Unit division: 0
Unit division: 0
Unit division: 0
Unit division: 0
Unit division: 0
Unit division: 0
Unit division: 0
Unit division: 0
Unit division: 0
Unit division: 0
Unit division: 0
Unit division: 0
Unit division: 0
Unit division: 0
Unit division: 0
Unit division: 0
Unit division: 0
Unit division: 0
Unit division: 0
Unit division: 0
Unit division: 0
Unit division: 0
Unit division: 0
Unit division: 0
Unit division: 0

## 2020-10-28 LAB — BPAM RBC
Blood Product Expiration Date: 202209262359
Blood Product Expiration Date: 202210122359
Blood Product Expiration Date: 202210122359
Blood Product Expiration Date: 202210122359
Blood Product Expiration Date: 202210122359
Blood Product Expiration Date: 202210122359
Blood Product Expiration Date: 202210122359
Blood Product Expiration Date: 202210122359
Blood Product Expiration Date: 202210122359
Blood Product Expiration Date: 202210132359
Blood Product Expiration Date: 202210132359
Blood Product Expiration Date: 202210132359
Blood Product Expiration Date: 202210142359
Blood Product Expiration Date: 202210142359
Blood Product Expiration Date: 202210142359
Blood Product Expiration Date: 202210142359
Blood Product Expiration Date: 202210142359
Blood Product Expiration Date: 202210142359
Blood Product Expiration Date: 202210152359
Blood Product Expiration Date: 202210152359
Blood Product Expiration Date: 202210152359
Blood Product Expiration Date: 202210152359
Blood Product Expiration Date: 202210152359
Blood Product Expiration Date: 202210152359
Blood Product Expiration Date: 202210152359
Blood Product Expiration Date: 202210172359
Blood Product Expiration Date: 202210182359
Blood Product Expiration Date: 202210182359
Blood Product Expiration Date: 202210182359
Blood Product Expiration Date: 202210182359
Blood Product Expiration Date: 202210192359
Blood Product Expiration Date: 202210192359
Blood Product Expiration Date: 202210192359
Blood Product Expiration Date: 202210202359
Blood Product Expiration Date: 202210202359
Blood Product Expiration Date: 202210242359
Blood Product Expiration Date: 202210242359
Blood Product Expiration Date: 202210252359
Blood Product Expiration Date: 202210252359
Blood Product Expiration Date: 202210252359
Blood Product Expiration Date: 202210252359
Blood Product Expiration Date: 202210262359
ISSUE DATE / TIME: 202209200910
ISSUE DATE / TIME: 202209200910
ISSUE DATE / TIME: 202209200910
ISSUE DATE / TIME: 202209200910
ISSUE DATE / TIME: 202209201212
ISSUE DATE / TIME: 202209201212
ISSUE DATE / TIME: 202209201331
ISSUE DATE / TIME: 202209201331
ISSUE DATE / TIME: 202209201413
ISSUE DATE / TIME: 202209201413
ISSUE DATE / TIME: 202209201413
ISSUE DATE / TIME: 202209201413
ISSUE DATE / TIME: 202209201655
ISSUE DATE / TIME: 202209201655
ISSUE DATE / TIME: 202209201655
ISSUE DATE / TIME: 202209201655
ISSUE DATE / TIME: 202209202037
ISSUE DATE / TIME: 202209202037
ISSUE DATE / TIME: 202209202137
ISSUE DATE / TIME: 202209202137
ISSUE DATE / TIME: 202209202331
ISSUE DATE / TIME: 202209202331
ISSUE DATE / TIME: 202209202331
ISSUE DATE / TIME: 202209202331
ISSUE DATE / TIME: 202209210347
ISSUE DATE / TIME: 202209210518
ISSUE DATE / TIME: 202209210530
ISSUE DATE / TIME: 202209210831
ISSUE DATE / TIME: 202209210831
ISSUE DATE / TIME: 202209211406
ISSUE DATE / TIME: 202209211406
ISSUE DATE / TIME: 202209211723
ISSUE DATE / TIME: 202209211723
ISSUE DATE / TIME: 202209220747
ISSUE DATE / TIME: 202209221356
ISSUE DATE / TIME: 202209221356
ISSUE DATE / TIME: 202209221356
ISSUE DATE / TIME: 202209221356
Unit Type and Rh: 6200
Unit Type and Rh: 6200
Unit Type and Rh: 6200
Unit Type and Rh: 6200
Unit Type and Rh: 6200
Unit Type and Rh: 6200
Unit Type and Rh: 6200
Unit Type and Rh: 6200
Unit Type and Rh: 6200
Unit Type and Rh: 6200
Unit Type and Rh: 6200
Unit Type and Rh: 6200
Unit Type and Rh: 6200
Unit Type and Rh: 6200
Unit Type and Rh: 6200
Unit Type and Rh: 6200
Unit Type and Rh: 6200
Unit Type and Rh: 6200
Unit Type and Rh: 6200
Unit Type and Rh: 6200
Unit Type and Rh: 6200
Unit Type and Rh: 6200
Unit Type and Rh: 6200
Unit Type and Rh: 6200
Unit Type and Rh: 6200
Unit Type and Rh: 6200
Unit Type and Rh: 6200
Unit Type and Rh: 6200
Unit Type and Rh: 6200
Unit Type and Rh: 6200
Unit Type and Rh: 6200
Unit Type and Rh: 6200
Unit Type and Rh: 6200
Unit Type and Rh: 6200
Unit Type and Rh: 6200
Unit Type and Rh: 6200
Unit Type and Rh: 6200
Unit Type and Rh: 6200
Unit Type and Rh: 6200
Unit Type and Rh: 6200
Unit Type and Rh: 6200
Unit Type and Rh: 6200

## 2020-10-28 LAB — POCT I-STAT 7, (LYTES, BLD GAS, ICA,H+H)
Acid-Base Excess: 1 mmol/L (ref 0.0–2.0)
Acid-base deficit: 2 mmol/L (ref 0.0–2.0)
Acid-base deficit: 3 mmol/L — ABNORMAL HIGH (ref 0.0–2.0)
Bicarbonate: 22.8 mmol/L (ref 20.0–28.0)
Bicarbonate: 26.4 mmol/L (ref 20.0–28.0)
Bicarbonate: 24.3 mmol/L (ref 20.0–28.0)
Calcium, Ion: 0.98 mmol/L — ABNORMAL LOW (ref 1.15–1.40)
Calcium, Ion: 1.09 mmol/L — ABNORMAL LOW (ref 1.15–1.40)
Calcium, Ion: 1.07 mmol/L — ABNORMAL LOW (ref 1.15–1.40)
HCT: 18 % — ABNORMAL LOW (ref 36.0–46.0)
HCT: 26 % — ABNORMAL LOW (ref 36.0–46.0)
HCT: 22 % — ABNORMAL LOW (ref 36.0–46.0)
Hemoglobin: 6.1 g/dL — CL (ref 12.0–15.0)
Hemoglobin: 8.8 g/dL — ABNORMAL LOW (ref 12.0–15.0)
Hemoglobin: 7.5 g/dL — ABNORMAL LOW (ref 12.0–15.0)
O2 Saturation: 100 %
O2 Saturation: 100 %
O2 Saturation: 100 %
Patient temperature: 36.4
Patient temperature: 36.5
Patient temperature: 36.6
Potassium: 3.8 mmol/L (ref 3.5–5.1)
Potassium: 3.7 mmol/L (ref 3.5–5.1)
Potassium: 4 mmol/L (ref 3.5–5.1)
Sodium: 145 mmol/L (ref 135–145)
Sodium: 142 mmol/L (ref 135–145)
Sodium: 143 mmol/L (ref 135–145)
TCO2: 24 mmol/L (ref 22–32)
TCO2: 28 mmol/L (ref 22–32)
TCO2: 26 mmol/L (ref 22–32)
pCO2 arterial: 38.6 mmHg (ref 32.0–48.0)
pCO2 arterial: 43 mmHg (ref 32.0–48.0)
pCO2 arterial: 52.7 mmHg — ABNORMAL HIGH (ref 32.0–48.0)
pH, Arterial: 7.377 (ref 7.350–7.450)
pH, Arterial: 7.394 (ref 7.350–7.450)
pH, Arterial: 7.27 — ABNORMAL LOW (ref 7.350–7.450)
pO2, Arterial: 397 mmHg — ABNORMAL HIGH (ref 83.0–108.0)
pO2, Arterial: 470 mmHg — ABNORMAL HIGH (ref 83.0–108.0)
pO2, Arterial: 297 mmHg — ABNORMAL HIGH (ref 83.0–108.0)

## 2020-10-28 LAB — PREPARE FRESH FROZEN PLASMA
Unit division: 0
Unit division: 0

## 2020-10-28 LAB — APTT
aPTT: 48 seconds — ABNORMAL HIGH (ref 24–36)
aPTT: 53 seconds — ABNORMAL HIGH (ref 24–36)

## 2020-10-28 LAB — CBC
HCT: 25.6 % — ABNORMAL LOW (ref 36.0–46.0)
HCT: 24 % — ABNORMAL LOW (ref 36.0–46.0)
HCT: 24.7 % — ABNORMAL LOW (ref 36.0–46.0)
HCT: 24.6 % — ABNORMAL LOW (ref 36.0–46.0)
HCT: 24 % — ABNORMAL LOW (ref 36.0–46.0)
Hemoglobin: 8.8 g/dL — ABNORMAL LOW (ref 12.0–15.0)
Hemoglobin: 8.2 g/dL — ABNORMAL LOW (ref 12.0–15.0)
Hemoglobin: 8.6 g/dL — ABNORMAL LOW (ref 12.0–15.0)
Hemoglobin: 8.4 g/dL — ABNORMAL LOW (ref 12.0–15.0)
Hemoglobin: 8.1 g/dL — ABNORMAL LOW (ref 12.0–15.0)
MCH: 29.9 pg (ref 26.0–34.0)
MCH: 30 pg (ref 26.0–34.0)
MCH: 30.2 pg (ref 26.0–34.0)
MCH: 30 pg (ref 26.0–34.0)
MCH: 30.1 pg (ref 26.0–34.0)
MCHC: 34.4 g/dL (ref 30.0–36.0)
MCHC: 34.2 g/dL (ref 30.0–36.0)
MCHC: 34.8 g/dL (ref 30.0–36.0)
MCHC: 34.1 g/dL (ref 30.0–36.0)
MCHC: 33.8 g/dL (ref 30.0–36.0)
MCV: 87.1 fL (ref 80.0–100.0)
MCV: 87.9 fL (ref 80.0–100.0)
MCV: 86.7 fL (ref 80.0–100.0)
MCV: 87.9 fL (ref 80.0–100.0)
MCV: 89.2 fL (ref 80.0–100.0)
Platelets: 51 10*3/uL — ABNORMAL LOW (ref 150–400)
Platelets: 45 10*3/uL — ABNORMAL LOW (ref 150–400)
Platelets: 45 10*3/uL — ABNORMAL LOW (ref 150–400)
Platelets: 44 10*3/uL — ABNORMAL LOW (ref 150–400)
Platelets: 39 10*3/uL — ABNORMAL LOW (ref 150–400)
RBC: 2.94 MIL/uL — ABNORMAL LOW (ref 3.87–5.11)
RBC: 2.73 MIL/uL — ABNORMAL LOW (ref 3.87–5.11)
RBC: 2.85 MIL/uL — ABNORMAL LOW (ref 3.87–5.11)
RBC: 2.8 MIL/uL — ABNORMAL LOW (ref 3.87–5.11)
RBC: 2.69 MIL/uL — ABNORMAL LOW (ref 3.87–5.11)
RDW: 15.4 % (ref 11.5–15.5)
RDW: 15.7 % — ABNORMAL HIGH (ref 11.5–15.5)
RDW: 15.7 % — ABNORMAL HIGH (ref 11.5–15.5)
RDW: 15.9 % — ABNORMAL HIGH (ref 11.5–15.5)
RDW: 16.1 % — ABNORMAL HIGH (ref 11.5–15.5)
WBC: 4.4 10*3/uL (ref 4.0–10.5)
WBC: 4.4 10*3/uL (ref 4.0–10.5)
WBC: 5.1 10*3/uL (ref 4.0–10.5)
WBC: 5.7 10*3/uL (ref 4.0–10.5)
WBC: 6.1 10*3/uL (ref 4.0–10.5)
nRBC: 0.5 % — ABNORMAL HIGH (ref 0.0–0.2)
nRBC: 0.7 % — ABNORMAL HIGH (ref 0.0–0.2)
nRBC: 0.4 % — ABNORMAL HIGH (ref 0.0–0.2)
nRBC: 0.4 % — ABNORMAL HIGH (ref 0.0–0.2)
nRBC: 0.8 % — ABNORMAL HIGH (ref 0.0–0.2)

## 2020-10-28 LAB — FIBRINOGEN
Fibrinogen: 428 mg/dL (ref 210–475)
Fibrinogen: 577 mg/dL — ABNORMAL HIGH (ref 210–475)

## 2020-10-28 LAB — PREPARE PLATELET PHERESIS
Unit division: 0
Unit division: 0

## 2020-10-28 LAB — BPAM PLATELET PHERESIS
Blood Product Expiration Date: 202209232359
Blood Product Expiration Date: 202209242359
ISSUE DATE / TIME: 202209221221
ISSUE DATE / TIME: 202209221221
Unit Type and Rh: 600
Unit Type and Rh: 6200

## 2020-10-28 LAB — BASIC METABOLIC PANEL
Anion gap: 6 (ref 5–15)
Anion gap: 8 (ref 5–15)
BUN: 16 mg/dL (ref 8–23)
BUN: 18 mg/dL (ref 8–23)
CO2: 23 mmol/L (ref 22–32)
CO2: 23 mmol/L (ref 22–32)
Calcium: 6.7 mg/dL — ABNORMAL LOW (ref 8.9–10.3)
Calcium: 7.3 mg/dL — ABNORMAL LOW (ref 8.9–10.3)
Chloride: 112 mmol/L — ABNORMAL HIGH (ref 98–111)
Chloride: 109 mmol/L (ref 98–111)
Creatinine, Ser: 1.33 mg/dL — ABNORMAL HIGH (ref 0.44–1.00)
Creatinine, Ser: 1.53 mg/dL — ABNORMAL HIGH (ref 0.44–1.00)
GFR, Estimated: 41 mL/min — ABNORMAL LOW (ref >60)
GFR, Estimated: 35 mL/min — ABNORMAL LOW (ref >60)
Glucose, Bld: 88 mg/dL (ref 70–99)
Glucose, Bld: 102 mg/dL — ABNORMAL HIGH (ref 70–99)
Potassium: 4.4 mmol/L (ref 3.5–5.1)
Potassium: 3.6 mmol/L (ref 3.5–5.1)
Sodium: 141 mmol/L (ref 135–145)
Sodium: 140 mmol/L (ref 135–145)

## 2020-10-28 LAB — HEPARIN LEVEL (UNFRACTIONATED): Heparin Unfractionated: <0.1 IU/mL — ABNORMAL LOW (ref 0.30–0.70)

## 2020-10-28 LAB — PROTIME-INR
INR: 1.5 — ABNORMAL HIGH (ref 0.8–1.2)
INR: 1.5 — ABNORMAL HIGH (ref 0.8–1.2)
Prothrombin Time: 18.4 seconds — ABNORMAL HIGH (ref 11.4–15.2)
Prothrombin Time: 18 seconds — ABNORMAL HIGH (ref 11.4–15.2)

## 2020-10-28 LAB — HEPATIC FUNCTION PANEL
ALT: 18 U/L (ref 0–44)
AST: 43 U/L — ABNORMAL HIGH (ref 15–41)
Albumin: 2.3 g/dL — ABNORMAL LOW (ref 3.5–5.0)
Alkaline Phosphatase: 24 U/L — ABNORMAL LOW (ref 38–126)
Bilirubin, Direct: 1.3 mg/dL — ABNORMAL HIGH (ref 0.0–0.2)
Indirect Bilirubin: 1.7 mg/dL — ABNORMAL HIGH (ref 0.3–0.9)
Total Bilirubin: 3 mg/dL — ABNORMAL HIGH (ref 0.3–1.2)
Total Protein: 3.9 g/dL — ABNORMAL LOW (ref 6.5–8.1)

## 2020-10-28 LAB — COOXEMETRY PANEL
Carboxyhemoglobin: 2.3 % — ABNORMAL HIGH (ref 0.5–1.5)
Carboxyhemoglobin: 2.7 % — ABNORMAL HIGH (ref 0.5–1.5)
Methemoglobin: 1.2 % (ref 0.0–1.5)
Methemoglobin: 0.9 % (ref 0.0–1.5)
O2 Saturation: 94.8 %
O2 Saturation: 87.7 %
Total hemoglobin: 7.8 g/dL — ABNORMAL LOW (ref 12.0–16.0)
Total hemoglobin: 8.4 g/dL — ABNORMAL LOW (ref 12.0–16.0)

## 2020-10-28 LAB — BPAM FFP
Blood Product Expiration Date: 202209252359
Blood Product Expiration Date: 202209252359
ISSUE DATE / TIME: 202209221221
ISSUE DATE / TIME: 202209221221
Unit Type and Rh: 6200
Unit Type and Rh: 6200

## 2020-10-28 LAB — GLUCOSE, CAPILLARY
Glucose-Capillary: 127 mg/dL — ABNORMAL HIGH (ref 70–99)
Glucose-Capillary: 188 mg/dL — ABNORMAL HIGH (ref 70–99)
Glucose-Capillary: 75 mg/dL (ref 70–99)
Glucose-Capillary: 82 mg/dL (ref 70–99)
Glucose-Capillary: 85 mg/dL (ref 70–99)
Glucose-Capillary: 86 mg/dL (ref 70–99)
Glucose-Capillary: 97 mg/dL (ref 70–99)

## 2020-10-28 LAB — LACTATE DEHYDROGENASE: LDH: 231 U/L — ABNORMAL HIGH (ref 98–192)

## 2020-10-28 LAB — MAGNESIUM: Magnesium: 1.7 mg/dL (ref 1.7–2.4)

## 2020-10-28 LAB — LACTIC ACID, PLASMA: Lactic Acid, Venous: 1 mmol/L (ref 0.5–1.9)

## 2020-10-28 MED ORDER — SODIUM CHLORIDE 0.9 % IV SOLN
INTRAVENOUS | Status: DC | PRN
  Administered 2020-10-28: 1000 mL via INTRAVENOUS

## 2020-10-28 MED ORDER — MAGNESIUM SULFATE 4 GM/100ML IV SOLN
4.0000 g | Freq: Once | INTRAVENOUS | Status: AC
  Administered 2020-10-28: 4 g via INTRAVENOUS
  Filled 2020-10-28: qty 100

## 2020-10-28 MED ORDER — VECURONIUM BROMIDE 10 MG IV SOLR
INTRAVENOUS | Status: AC
  Filled 2020-10-28: qty 10

## 2020-10-28 MED ORDER — SODIUM CHLORIDE 0.9% IV SOLUTION: Freq: Once | INTRAVENOUS | Status: AC

## 2020-10-28 MED ORDER — AMIODARONE IV BOLUS ONLY 150 MG/100ML
150.0000 mg | Freq: Once | INTRAVENOUS | Status: AC
  Administered 2020-10-28: 150 mg via INTRAVENOUS

## 2020-10-28 MED ORDER — DIATRIZOATE MEGLUMINE & SODIUM 66-10 % PO SOLN
30.0000 mL | Freq: Once | ORAL | Status: AC
  Administered 2020-10-28: 10 mL
  Filled 2020-10-28: qty 30

## 2020-10-28 MED ORDER — ALBUMIN HUMAN 5 % IV SOLN
INTRAVENOUS | Status: AC
  Administered 2020-10-28: 12.5 g
  Filled 2020-10-28: qty 500

## 2020-10-28 MED ORDER — POLYETHYLENE GLYCOL 3350 17 G PO PACK
17.0000 g | PACK | Freq: Two times a day (BID) | ORAL | Status: DC
  Administered 2020-10-28 – 2020-10-29 (×3): 17 g
  Filled 2020-10-28 (×5): qty 1

## 2020-10-28 MED ORDER — POTASSIUM CHLORIDE 10 MEQ/50ML IV SOLN
10.0000 meq | INTRAVENOUS | Status: AC
  Administered 2020-10-28 (×4): 10 meq via INTRAVENOUS
  Filled 2020-10-28 (×4): qty 50

## 2020-10-28 MED ORDER — ARTIFICIAL TEARS OPHTHALMIC OINT
TOPICAL_OINTMENT | OPHTHALMIC | Status: DC | PRN
  Filled 2020-10-28: qty 3.5

## 2020-10-28 MED ORDER — VITAL HIGH PROTEIN PO LIQD: 1000.0000 mL | ORAL | Status: DC

## 2020-10-28 MED ORDER — SENNOSIDES 8.8 MG/5ML PO SYRP
10.0000 mL | ORAL_SOLUTION | Freq: Two times a day (BID) | ORAL | Status: DC
  Administered 2020-10-28 – 2020-10-31 (×6): 10 mL via ORAL
  Filled 2020-10-28 (×6): qty 10

## 2020-10-28 MED ORDER — NOREPINEPHRINE 16 MG/250ML-% IV SOLN
0.0000 ug/min | INTRAVENOUS | Status: DC
  Administered 2020-10-28: 2 ug/min via INTRAVENOUS
  Administered 2020-10-29: 6 ug/min via INTRAVENOUS
  Administered 2020-10-30: 18 ug/min via INTRAVENOUS
  Administered 2020-10-30: 16 ug/min via INTRAVENOUS
  Administered 2020-10-31: 10 ug/min via INTRAVENOUS
  Administered 2020-11-01: 28 ug/min via INTRAVENOUS
  Filled 2020-10-28 (×5): qty 250

## 2020-10-28 MED ORDER — AMIODARONE HCL IN DEXTROSE 360-4.14 MG/200ML-% IV SOLN
60.0000 mg/h | INTRAVENOUS | Status: AC
  Administered 2020-10-28: 60 mg/h via INTRAVENOUS
  Filled 2020-10-28: qty 400

## 2020-10-28 MED ORDER — CALCIUM GLUCONATE-NACL 2-0.675 GM/100ML-% IV SOLN
2.0000 g | Freq: Once | INTRAVENOUS | Status: AC
  Administered 2020-10-28: 2000 mg via INTRAVENOUS
  Filled 2020-10-28: qty 100

## 2020-10-28 MED ORDER — AMIODARONE LOAD VIA INFUSION
150.0000 mg | Freq: Once | INTRAVENOUS | Status: AC
  Administered 2020-10-28: 150 mg via INTRAVENOUS
  Filled 2020-10-28: qty 83.34

## 2020-10-28 MED ORDER — FUROSEMIDE 10 MG/ML IJ SOLN: 3.0000 mg/h | INTRAVENOUS | Status: DC

## 2020-10-28 MED ORDER — FUROSEMIDE 10 MG/ML IJ SOLN
3.0000 mg/h | INTRAVENOUS | Status: DC
  Filled 2020-10-28: qty 20

## 2020-10-28 MED ORDER — AMIODARONE HCL IN DEXTROSE 360-4.14 MG/200ML-% IV SOLN
60.0000 mg/h | INTRAVENOUS | Status: DC
  Administered 2020-10-29 – 2020-10-30 (×3): 30 mg/h via INTRAVENOUS
  Administered 2020-10-30 (×3): 60 mg/h via INTRAVENOUS
  Administered 2020-10-31 (×2): 30 mg/h via INTRAVENOUS
  Administered 2020-10-31 – 2020-11-01 (×3): 60 mg/h via INTRAVENOUS
  Filled 2020-10-28 (×6): qty 200
  Filled 2020-10-28: qty 400
  Filled 2020-10-28 (×2): qty 200

## 2020-10-28 MED ORDER — METOCLOPRAMIDE HCL 5 MG/ML IJ SOLN
20.0000 mg | Freq: Once | INTRAVENOUS | Status: AC
  Administered 2020-10-28: 20 mg via INTRAVENOUS
  Filled 2020-10-28: qty 4

## 2020-10-28 MED ORDER — FUROSEMIDE 10 MG/ML IJ SOLN
20.0000 mg | Freq: Once | INTRAMUSCULAR | Status: AC
  Administered 2020-10-28: 20 mg via INTRAVENOUS
  Filled 2020-10-28: qty 2

## 2020-10-28 MED ORDER — ALBUMIN HUMAN 25 % IV SOLN
12.5000 g | Freq: Once | INTRAVENOUS | Status: AC
  Administered 2020-10-28: 12.5 g via INTRAVENOUS

## 2020-10-28 MED ORDER — FUROSEMIDE 10 MG/ML IJ SOLN
5.0000 mg/h | INTRAVENOUS | Status: DC
  Administered 2020-10-28: 5 mg/h via INTRAVENOUS
  Filled 2020-10-28: qty 20

## 2020-10-28 MED ORDER — MAGNESIUM SULFATE 2 GM/50ML IV SOLN: 2.0000 g | Freq: Once | INTRAVENOUS | Status: DC

## 2020-10-28 NOTE — Procedures (Addendum)
Cortrak  Person Inserting Tube:  Kendell Bane C, RD Tube Type:  Cortrak - 43 inches Tube Size:  10 Tube Location:  Left nare Initial Placement:  Stomach Secured by: Bridle Technique Used to Measure Tube Placement:  Marking at nare/corner of mouth Cortrak Secured At:  68 cm  Cortrak Tube Team Note:  Consult received to place a Cortrak feeding tube.  Plan to receive reglan and then cortrak team will attempt to advance tube post pyloric.  Reglan given. Attempted to advance tube without success. Orders place for IR for tube advancement as unable to use cortrak with gastric placement.   Tube requires advancement by IR before use.    If the tube becomes dislodged please keep the tube and contact the Cortrak team at [email or web address] (password [email or web address]) for replacement.  If after hours and replacement cannot be delayed, place a NG tube and confirm placement with an abdominal x-ray.    Cammy Copa., RD, LDN, CNSC See AMiON for contact information

## 2020-10-28 NOTE — Progress Notes (Signed)
Patient ID: Veronica Swanson, female   DOB: 02/18/1945, 75 y.o.   MRN: [id number]    Advanced Heart Failure Rounding Note   Subjective:    - Chest washout 9/22.   On central VA ECMO with Impella at P-3 for vent  Intubated. Sedated. Chest open.  No blood products overnight. .    On epinephrine 0.5, VP 0.03, Milrinone 0.375. SvO2 88%.   NSR in 50s with PACs. Atrial lead does not capture.    ECMO  Speed 2800 rpm Flow 3.44 L/min pVenous -18 DeltaP 14 Sweep 3  Impella P3, flow 0.6 L/min. No alarms  Making urine, 2295 cc.  +2903, weight up.  Scr 1.39 => 1.25 => 1.33 Hgb 8.6 Platelets 45   Objective:   Weight Range:  Vital Signs:   Temp:  [97.3 F (36.3 C)-97.7 F (36.5 C)] 97.5 F (36.4 C) (09/23 0700) Pulse Rate:  [47-68] 61 (09/23 0700) Resp:  [0-18] 16 (09/23 0700) BP: (72-78)/(69-75) 76/70 (09/22 1357) SpO2:  [100 %] 100 % (09/23 0700) Arterial Line BP: (70-97)/(64-83) 97/83 (09/23 0700) FiO2 (%):  [40 %] 40 % (09/23 0400) Weight:  [85.7 kg] 85.7 kg (09/23 0500) Last BM Date: 10/23/20  Weight change: Filed Weights   10/26/20 0500 10/14/2020 0415 10/28/20 0500  Weight: 82.6 kg 85.9 kg 85.7 kg    Intake/Output:   Intake/Output Summary (Last 24 hours) at 10/28/2020 0752 Last data filed at 10/28/2020 0700 Gross per 24 hour  Intake 6757.66 ml  Output 3855 ml  Net 2902.66 ml     Physical Exam: General: Intubated/sedated Neck: JVP elevated, no thyromegaly or thyroid nodule.  Lungs: Decreased bilaterally.  CV: Chest wall open.  Abdomen: Soft, nontender, no hepatosplenomegaly, no distention.  Skin: Intact without lesions or rashes.  Neurologic: Sedated on vent Extremities: No clubbing or cyanosis.  HEENT: Normal.    Telemetry: Sinus 50s Personally reviewed   Labs: Basic Metabolic Panel: Recent Labs  Lab 10/26/20 0409 10/26/20 0412 10/26/20 2358 10/24/2020 0418 10/22/2020 0419 10/29/2020 1113 10/09/2020 1624 11/04/2020 1936 10/08/2020 1941  10/28/20 0212 10/28/20 0436  NA 144   < > 139   < > 141   < > 141 141 143 141 145  K 3.3*   < > 4.8   < > 4.8   < > 4.6 4.6 4.5 4.4 3.8  CL 106   < > 108  --  109  --  108 111  --  112*  --   CO2 23   < > 23  --  23  --  24 24  --  23  --   GLUCOSE 164*   < > 180*  --  152*  --  137* 107*  --  88  --   BUN 10   < > 12  --  8  --  13 14  --  16  --   CREATININE 1.39*   < > 1.26*  --  1.25*  --  1.32* 1.34*  --  1.33*  --   CALCIUM 7.1*   < > 6.4*  --  7.0*  --  6.4* 6.4*  --  6.7*  --   MG 1.7  --   --   --  1.8  --   --   --   --  1.7  --    < > = values in this interval not displayed.    Liver Function Tests: Recent Labs  Lab 10/12/2020 1333 10/28/2020 1958  10/26/20 0409 10/07/2020 0419 10/28/20 0212  AST 24 97* 63* 37 43*  ALT _0 ALKPHOS 25* 16* 25* 21* 24*  BILITOT 0.5 1.3* 1.1 1.7* 3.0*  PROT 7.3 3.6* 3.7* 3.8* 3.9*  ALBUMIN 3.9 2.6* 2.2* 2.4* 2.3*   No results for input(s): LIPASE, AMYLASE in the last 168 hours. No results for input(s): AMMONIA in the last 168 hours.  CBC: Recent Labs  Lab 11/02/2020 1333 10/22/20 0051 10/19/2020 2328 10/11/2020 2330 10/16/2020 0519 10/07/2020 1113 10/17/2020 1624 10/15/2020 1936 10/28/2020 1941 10/28/20 0212 10/28/20 0434 10/28/20 0436  WBC 4.4   < > 2.7*   < > 4.9  --  3.8* 3.6*  --  4.4 4.4  --   NEUTROABS 1.5*  --  2.1  --   --   --   --   --   --   --   --   --   HGB 13.0   < > 7.3*   < > 8.1*   < > 9.2* 9.6* 8.5* 8.8* 8.2* 6.1*  HCT 39.5   < > 22.4*   < > 23.5*   < > 26.7* 27.0* 25.0* 25.6* 24.0* 18.0*  MCV 85.9   < > 82.7   < > 85.5  --  86.4 85.7  --  87.1 87.9  --   PLT 264   < > 19*   < > 45*  --  64* 57*  --  51* 45*  --    < > = values in this interval not displayed.    Cardiac Enzymes: No results for input(s): CKTOTAL, CKMB, CKMBINDEX, TROPONINI in the last 168 hours.  BNP: BNP (last 3 results) Recent Labs    10/22/20 0954  BNP 1,154.2*    ProBNP (last 3 results) No results for input(s): PROBNP in the last  8760 hours.    Other results:  Imaging: DG CHEST PORT 1 VIEW  Result Date: 10/26/2020 CLINICAL DATA:  Post CABG on ECMO EXAM: PORTABLE CHEST 1 VIEW COMPARISON:  Chest x-ray dated October 26, 2020 FINDINGS: New left apical pleural effusion. Bilateral heterogeneous pulmonary opacities, likely due to pulmonary edema. No evidence of pneumothorax. Slight interval retraction of right IJ approach PA catheter with tip projecting over the expected area of the main pulmonary artery. Stable position of ET tube, enteric tube, mediastinal and left-sided chest tubes, Impella device, and ECMO cannula. Surgical sponges project over the mediastinum, unchanged compared to prior. Cardiac and mediastinal contours are unchanged. IMPRESSION: New left apical pleural effusion. Critical Value/emergent results were called by telephone at the time of interpretation on 10/22/2020 at 9:18 am to RN Clare Charon, who verbally acknowledged these results. Electronically Signed   By: Yetta Glassman M.D.   On: 10/06/2020 09:19   ECHO TEE  Result Date: 10/16/2020    TRANSESOPHOGEAL ECHO REPORT   Patient Name:   Veronica Swanson Date of Exam: 10/22/2020 Medical Rec #:  [id number]        Height:       67.0 in Accession #:    [id number]       Weight:       189.4 lb Date of Birth:  1945/10/21        BSA:          1.976 m Patient Age:    36 years         BP:           MAP 70s/NA mmHg Patient  Gender: F                HR:           70 bpm. Exam Location:  Inpatient Procedure: Transesophageal Echo Indications:     ECMO evaluation  History:         Patient has prior history of Echocardiogram examinations. CHF.  Sonographer:     NA Referring Phys:  [place] Diagnosing Phys: Kirk Ruths McleanMD PROCEDURE: The transesophogeal probe was passed without difficulty through the esophogus of the patient. Sedation performed by different physician. The patient developed no complications during the procedure. IMPRESSIONS  1. Left ventricular ejection  fraction, by estimation, is 20 to 25%. The left ventricle is small/decompressed. The left ventricle has severely decreased function. The left ventricle demonstrates global hypokinesis. There is mild left ventricular hypertrophy. Impella at 5.4 cm in the left ventricle.  2. Right ventricular systolic function is moderately reduced. The right ventricular size is normal.  3. No left atrial/left atrial appendage thrombus was detected.  4. ECMO inflow cannula noted in right atrium.  5. Mild posterior leaflet prolapse. The mitral valve is abnormal. Mild mitral valve regurgitation. No evidence of mitral stenosis.  6. Impella catheter across aortic valve. The aortic valve is tricuspid. Aortic valve regurgitation is trivial.  7. No PFO or ASD by color doppler. FINDINGS  Left Ventricle: Left ventricular ejection fraction, by estimation, is 20 to 25%. The left ventricle has severely decreased function. The left ventricle demonstrates global hypokinesis. The left ventricular internal cavity size was small. There is mild left ventricular hypertrophy. Right Ventricle: The right ventricular size is normal. No increase in right ventricular wall thickness. Right ventricular systolic function is moderately reduced. Left Atrium: Left atrial size was normal in size. No left atrial/left atrial appendage thrombus was detected. Right Atrium: Right atrial size was normal in size. Pericardium: There is no evidence of pericardial effusion. Mitral Valve: Mild posterior leaflet prolapse. The mitral valve is abnormal. Mild mitral valve regurgitation. No evidence of mitral valve stenosis. Tricuspid Valve: The tricuspid valve is normal in structure. Tricuspid valve regurgitation is mild. Aortic Valve: Impella catheter across aortic valve. The aortic valve is tricuspid. Aortic valve regurgitation is trivial. Pulmonic Valve: The pulmonic valve was normal in structure. Pulmonic valve regurgitation is trivial. Aorta: The aortic root is normal in size  and structure. IAS/Shunts: No PFO or ASD by color doppler. [place] [employer] Electronically signed by Franki Monte Signature Date/Time: 10/25/2020/4:13:19 PM    Final (Updated)      Medications:     Scheduled Medications:  acetaminophen  1,000 mg Oral Q6H   Or   acetaminophen (TYLENOL) oral liquid 160 mg/5 mL  1,000 mg Per Tube Q6H   acetaminophen (TYLENOL) oral liquid 160 mg/5 mL  650 mg Per Tube Once   Or   acetaminophen  650 mg Rectal Once   aspirin EC  325 mg Oral Daily   Or   aspirin  324 mg Per Tube Daily   bisacodyl  10 mg Oral Daily   Or   bisacodyl  10 mg Rectal Daily   chlorhexidine gluconate (MEDLINE KIT)  15 mL Mouth Rinse BID   Chlorhexidine Gluconate Cloth  6 each Topical Daily   docusate  100 mg Per Tube BID   furosemide  20 mg Intravenous Once   insulin aspart  0-24 Units Subcutaneous Q4H   mouth rinse  15 mL Mouth Rinse 10 times per day   pantoprazole (PROTONIX) IV  40 mg Intravenous Q24H   polyethylene glycol  17 g Per Tube Daily   rosuvastatin  40 mg Oral Daily   sodium chloride flush  10-40 mL Intracatheter Q12H   sodium chloride flush  3 mL Intravenous Q12H    Infusions:  sodium chloride     sodium chloride Stopped (10/28/20 9242)   sodium chloride 100 mL/hr at 10/28/20 0700   albumin human Stopped (10/28/20 0602)   cefUROXime (ZINACEF)  IV 200 mL/hr at 10/28/20 0700   dexmedetomidine (PRECEDEX) IV infusion 0.7 mcg/kg/hr (10/28/20 0700)   DOPamine     epinephrine 0.5 mcg/min (10/28/20 0700)   feeding supplement (PIVOT 1.5 CAL)     furosemide (LASIX) 200 mg in dextrose 5% 100 mL (81m/mL) infusion     heparin 50 units/mL (Impella PURGE) in dextrose 5 % 1000 mL bag     HYDROmorphone 3 mg/hr (10/28/20 0700)   lactated ringers 20 mL/hr at 10/28/20 0700   midazolam 2 mg/hr (10/28/20 0700)   milrinone 0.375 mcg/kg/min (10/28/20 0700)   nitroGLYCERIN     norepinephrine (LEVOPHED) Adult infusion Stopped (10/26/20 0049)   phenylephrine (NEO-SYNEPHRINE)  Adult infusion     vancomycin Stopped (10/15/2020 2101)   vasopressin 0.03 Units/min (10/28/20 0700)    PRN Medications: sodium chloride, albumin human, HYDROmorphone, HYDROmorphone (DILAUDID) injection, HYDROmorphone (DILAUDID) injection, midazolam, ondansetron (ZOFRAN) IV   Assessment/Plan:   1. Shock: Cardiogenic/vasoplegic/hemorrhagic post-CABG with acute lung injury. Central cannulation for VA ECMO, ascending aorta and RA.  Impella 5.5 for venting (ascending aorta). Goal to eventually wean ECMO to Impella support alone.  Currently on epinephrine 0.5, VP 0.03, milrinone 0.375. SvO2 88%.  I>>O, weight up.  Still with good UOP and creatinine 1.3. LDH stable but small amt clot noted in circuit.   - Titrate pressors for MAP >65, start with vasopressin.  - Need to diurese today, start Lasix 20 mg x 1 then 5 mg/hr.  - ECMO circuit stable, no systemic heparin with bleeding.  If bleeding remains quiescent, start tomorrow.  - Impella at P3, flow 0.8, no alarms.  Heparin in purge.   2. Acute blood loss Anemia/coagulopathy: Has had chest washout x 3.  No blood products overnight.  Seems to have slowed.  - bid CBC and product transfusion as needed.  - keep Hgb > 8 3. Acute hypoxemic respiratory failure: ALI with pulmonary edema and pulmonary hemorrhage.  CXR with bilateral pulmonary edema (severe).   - Lung protective ventilation per CCM.  - Not ready for ECMO wean yet, need lungs clearer.  - Diuresis today as above.  4. Acute on chronic systolic CHF: Ischemic cardiomyopathy.  Pre-op echo with EF 35-40%.  Intra-op echo with LV EF 25% range but good RV function.  TEE 9/22 with RV small, moderate dysfunction and LV small, EF 20-25%.    - Diuresis as above.   5. Renal: Renal function stable thus far. Continue to follow.  6. Thrombocytopenia: Due to coagulopathy.  Replace as needed 7. FEN: Place Cortrak today and need to start trickle feeds.  8. Neuro: No purposeful response with sedation wean  yesterday but had reflexes.  Wean sedation today.     CRITICAL CARE Performed by: DLoralie Champagne Total critical care time: 40 minutes  Critical care time was exclusive of separately billable procedures and treating other patients.  Critical care was necessary to treat or prevent imminent or life-threatening deterioration.  Critical care was time spent personally by me (independent of midlevel providers or residents) on the  following activities: development of treatment plan with patient and/or surrogate as well as nursing, discussions with consultants, evaluation of patient's response to treatment, examination of patient, obtaining history from patient or surrogate, ordering and performing treatments and interventions, ordering and review of laboratory studies, ordering and review of radiographic studies, pulse oximetry and re-evaluation of patient's condition.   Length of Stay: 7   Rossi Silvestro Aundra Dubin 10/28/2020, 7:52 AM  Advanced Heart Failure Team Pager [phone] (M-F; 7a - 4p)  Please contact [facility] for night-coverage after hours (4p -7a ) and weekends on [email or web address]

## 2020-10-28 NOTE — Progress Notes (Signed)
ECMO PROGRESS NOTE  NAME:  Veronica Swanson, MRN:  [id number], DOB:  [birth year], LOS: 7 ADMISSION DATE:  10/15/2020, CONSULTATION DATE:  10/16/2020 REFERRING MD:  Roxan Hockey, CHIEF COMPLAINT:  VA-ECMO   HPI/course in hospital  75 year old woman who was placed on VA-ECMO support following CABGx4.  She presented 4 days ago with CCS class 4 angina and ruled in for NSTEMI. Echo showed EF of 35% with mild mitral regurgitation and an anterior wall regional wall motion abnormalities.  Coronary catheterization revealed triple-vessel disease.  LVEDP was normal at that time.  She was scheduled for urgent CABG.  This morning she began experiencing retrosternal chest pain around the time of arrival in the operating room.  She received a standard cardiac conduction with no hypotension.  However, she became progressively hypotensive and suffered a cardiac arrest likely due to a further ischemic event.  She was urgently cannulated for cardiopulmonary bypass.  She developed severe pulmonary edema and was very difficult to oxygenate.  She underwent SL bypass with reasonable targets.  She could not separate from bypass due to pulmonary edema and poor LV function.  She was converted to [facility] ECMO and a 5 5 Impella was placed and she was transferred to the ICU.  Interim history/subjective:  Washout yesterday. TEE shows decompressed heart but poor function.  Decreased chest tube drainage.    Objective   Blood pressure (!) 76/70, pulse 61, temperature (!) 97.5 F (36.4 C), resp. rate 16, height _0  (1.702 m), weight 85.7 kg, SpO2 100 %. PAP: (12-26)/(5-10) 20/9 CVP:  [11 mmHg-15 mmHg] 13 mmHg  Vent Mode: PCV FiO2 (%):  [40 %] 40 % Set Rate:  [16 bmp] 16 bmp PEEP:  [5 cmH20] 5 cmH20 Plateau Pressure:  [11 cmH20-15 cmH20] 15 cmH20   Intake/Output Summary (Last 24 hours) at 10/28/2020 0734 Last data filed at 10/28/2020 0700 Gross per 24 hour  Intake 6757.66 ml  Output 3855 ml  Net 2902.66 ml     Filed Weights   10/26/20 0500 10/20/2020 0415 10/28/20 0500  Weight: 82.6 kg 85.9 kg 85.7 kg    Examination: Physical Exam Constitutional:      Interventions: She is sedated and intubated.  HENT:     Head:     Comments: OGT and ETT in place.  Eyes:     Conjunctiva/sclera: Conjunctivae normal.  Cardiovascular:     Pulses: Intact distal pulses.     Heart sounds: Heart sounds are distant.    No friction rub.     Comments: Generalized edema.  Pulmonary:     Effort: She is intubated.     Breath sounds: Normal breath sounds.  Chest:     Comments: Open chest. Sero-sanguinous drainage. Abdominal:     General: Abdomen is flat and scaphoid. Bowel sounds are decreased.     Palpations: Abdomen is soft.     Tenderness: There is no abdominal tenderness.  Genitourinary:    Comments: Foley in place with clear urine.  Skin:    General: Skin is warm.     Comments: Line sites are intact.  Neurological:     Mental Status: She is unresponsive.     Assessment & Plan:  Critically ill due to cardiogenic and vasoplegic shock following CABG complicated by perioperative cardiac arrest requiring inotropic, vasopressor mechanical cardiac support and VA ECMO  Ischemic cardiomyopathy due to recent acute non-ST elevation coronary syndrome Critically ill due to acute hypoxic respiratory  failure requiring mechanical ventilation and ECMO support Acute kidney injury - improving Acute blood loss anemia Coagulopathy secondary to cardiopulmonary bypass  - Continue vent support, VAP prevention bundle, sedation titrated to RASS -4 to -5 - Adjust sweep for pH 7.35-7.45 - Decreasing chest tube output. At this point seems to be more blood tinged serous discharge.   - Plan to start low-dose anticoagulation tomorrow.  - Priority today to initiate diuresis in attempt to improve lung function and potentially transition to Impella only support.   - Impella, inotrope and ECMO pump support to continue at current  settings, still severely stunned heart on echo.  - Levophed/vasopressin titrating to MAP 65 for concurrent vasoplegia from ECMO circuit and arrest - significantly improved.    Daily Goals Checklist  Pain/Anxiety/Delirium protocol (if indicated): Dilaudid and Versed infusions VAP protocol (if indicated): Bundle in place Respiratory support goals: ECMO rest setting, keep plateau pressure less than 25, current (9/22) is PEEP 5, Plat 15 Blood pressure target: MAP 70-80 DVT prophylaxis: Plan to start systemic AC tomorrow.  Nutritional status and feeding goals: on hold for now GI prophylaxis: PPI Fluid status goals: initiate diuresis Urinary catheter: Guide hemodynamic management Central lines: Right IJ MAC introducer, left radial art line Glucose control: Insulin infusion by [employer] Mobility/therapy needs: Bedrest Antibiotic de-escalation: Vancomycin and cefuroxime for open chest Home medication reconciliation: On hold Daily labs: Per ECMO protocol Code Status: Full code Family Communication: Updated by Dr Tamala Julian 9/21 Disposition: ICU  CRITICAL CARE Performed by: Kipp Brood  Total critical care time: 45 minutes  Critical care time was exclusive of separately billable procedures and treating other patients.  Critical care was necessary to treat or prevent imminent or life-threatening deterioration.  Critical care was time spent personally by me on the following activities: development of treatment plan with patient and/or surrogate as well as nursing, discussions with consultants, evaluation of patient's response to treatment, examination of patient, obtaining history from patient or surrogate, ordering and performing treatments and interventions, ordering and review of laboratory studies, ordering and review of radiographic studies, pulse oximetry, re-evaluation of patient's condition and participation in multidisciplinary rounds.  Kipp Brood, MD [facility] ICU Physician [place]  Pager: [phone] Mobile: [phone] After hours: [phone].

## 2020-10-28 NOTE — Progress Notes (Signed)
[facility] for heparin Indication:  Impella 5.5 + ECMO VA  Allergies  Allergen Reactions   Penicillins Other (See Comments)    Dizzy     Patient Measurements: Height: '5\' 7"'  (170.2 cm) Weight: 85.7 kg (188 lb 15 oz) IBW/kg (Calculated) : 61.6 Heparin Dosing Weight: 78.7 kg   Vital Signs: Temp: 97.7 F (36.5 C) (09/23 1530) Temp Source: Bladder (09/23 1200) Pulse Rate: 73 (09/23 1530)  Labs: Recent Labs    10/26/20 1740 10/26/20 1759 10/22/2020 0419 10/26/2020 0519 11/03/2020 1624 10/10/2020 1936 10/22/2020 1941 10/28/20 0212 10/28/20 0434 10/28/20 0436 10/28/20 1119  HGB  --    < >  --    < > 9.2* 9.6*   < > 8.8* 8.2* 6.1* 8.6*  HCT  --    < >  --    < > 26.7* 27.0*   < > 25.6* 24.0* 18.0* 24.7*  PLT  --    < >  --    < > 64* 57*  --  51* 45*  --  45*  APTT  --    < >  --    < > 47* 46*  --  48*  --   --   --   LABPROT  --    < >  --    < > 15.9* 17.2*  --  18.4*  --   --   --   INR  --    < >  --    < > 1.3* 1.4*  --  1.5*  --   --   --   HEPARINUNFRC <0.10*  --  <0.10*  --   --   --   --   --  <0.10*  --   --   CREATININE  --    < > 1.25*  --  1.32* 1.34*  --  1.33*  --   --   --    < > = values in this interval not displayed.     Estimated Creatinine Clearance: 40.4 mL/min (A) (by C-G formula based on SCr of 1.33 mg/dL (H)).   Medical History: Past Medical History:  Diagnosis Date   Carotid artery occlusion    Coronary artery disease    Hyperlipidemia    Hypertension    Myocardial infarction ([place]) 1997    Medications:  Scheduled:   aspirin EC  325 mg Oral Daily   Or   aspirin  324 mg Per Tube Daily   bisacodyl  10 mg Oral Daily   Or   bisacodyl  10 mg Rectal Daily   chlorhexidine gluconate (MEDLINE KIT)  15 mL Mouth Rinse BID   Chlorhexidine Gluconate Cloth  6 each Topical Daily   docusate  100 mg Per Tube BID   insulin aspart  0-24 Units Subcutaneous Q4H   mouth rinse  15 mL Mouth Rinse 10 times per day    pantoprazole (PROTONIX) IV  40 mg Intravenous Q24H   polyethylene glycol  17 g Per Tube BID   rosuvastatin  40 mg Oral Daily   sennosides  10 mL Oral BID   sodium chloride flush  10-40 mL Intracatheter Q12H   sodium chloride flush  3 mL Intravenous Q12H    Assessment: 24 yof who underwent CABG x4 complicated cardiac arrest - now has impella 5.5 and on VA-ECMO. No AC PTA.  VA-ECMO with sweep 3.5 L. Impella 5.5 running at P-3 - purge flow 7.5 mL/hr (375 units/hr  of heparin from purge solution). Purge pressure 500s. Still bleeding however improving.   HL <0.1, will continue just [facility] in Impella. ECMO circuit assessed for clots, none noted. Fibrinogen strands starting to develop, so may need to consider systemic AC soon. No TEG today.  Goal of Therapy:  Heparin level <0.3 units/ml Monitor platelets by anticoagulation protocol: Yes   Plan:  Continue heparin purge solution (50 units/mL) with Impella No systemic heparin at this time Monitor s/sx of bleeding - monitor heparin level with AM labs Monitoring with TEGs  Cathrine Muster, PharmD PGY2 Cardiology Pharmacy Resident Phone: [phone] 10/28/2020  3:43 PM  Please check [email or web address] for unit-specific pharmacy phone numbers.

## 2020-10-28 NOTE — Progress Notes (Signed)
Paged Shirlee Latch MD about patient dropping BP despite increasing ECMO flow. Orders received to hold lasix drips for a few hours and then restart it at 3.

## 2020-10-28 NOTE — Progress Notes (Addendum)
eLink Physician-Brief Progress Note Patient Name: Veronica Swanson DOB: 01-Apr-1945 MRN: [id number]   Date of Service  10/28/2020  HPI/Events of Note  Hypomagnesemia - Mg++ = 1.7 and Creatinine = 1.33. Hypocalcemia - Ca++ = 6.7 which corrects to 8.06 (Low) given albumin = 2.3   eICU Interventions  Will replace Mg++ and Ca++.     Intervention Category Major Interventions: Electrolyte abnormality - evaluation and management  Leitha Hyppolite [place] 10/28/2020, 3:10 AM

## 2020-10-28 NOTE — Progress Notes (Signed)
Patient ID: Veronica Swanson, female   DOB: 11-08-1945, 75 y.o.   MRN: [id number] Extracorporeal support note   ECLS support day: Indication: 3  Configuration: Central RA/Ao  Pump speed: 2800 rpm Pump flow: 3.44 L/min  Sweep gas: 3.5  Circuit check: small amount thrombus Anticoagulant: None, only heparin in Impella purge due to ongoing bleeding Anticoagulation targets: N/A  Changes in support: None  Anticipated goals/duration of support: Wean to Impella.   Veronica Swanson 10/28/2020 7:49 AM

## 2020-10-29 ENCOUNTER — Inpatient Hospital Stay (HOSPITAL_COMMUNITY): Payer: Medicare Other

## 2020-10-29 DIAGNOSIS — R570 Cardiogenic shock: Secondary | ICD-10-CM | POA: Diagnosis not present

## 2020-10-29 DIAGNOSIS — Z951 Presence of aortocoronary bypass graft: Secondary | ICD-10-CM | POA: Diagnosis not present

## 2020-10-29 DIAGNOSIS — R079 Chest pain, unspecified: Secondary | ICD-10-CM | POA: Diagnosis not present

## 2020-10-29 LAB — POCT I-STAT 7, (LYTES, BLD GAS, ICA,H+H)
Acid-Base Excess: 0 mmol/L (ref 0.0–2.0)
Acid-Base Excess: 0 mmol/L (ref 0.0–2.0)
Acid-Base Excess: 0 mmol/L (ref 0.0–2.0)
Acid-Base Excess: 0 mmol/L (ref 0.0–2.0)
Acid-Base Excess: 0 mmol/L (ref 0.0–2.0)
Acid-base deficit: 2 mmol/L (ref 0.0–2.0)
Bicarbonate: 23.4 mmol/L (ref 20.0–28.0)
Bicarbonate: 25.4 mmol/L (ref 20.0–28.0)
Bicarbonate: 25.4 mmol/L (ref 20.0–28.0)
Bicarbonate: 25.5 mmol/L (ref 20.0–28.0)
Bicarbonate: 25.8 mmol/L (ref 20.0–28.0)
Bicarbonate: 25.4 mmol/L (ref 20.0–28.0)
Calcium, Ion: 1.03 mmol/L — ABNORMAL LOW (ref 1.15–1.40)
Calcium, Ion: 1.1 mmol/L — ABNORMAL LOW (ref 1.15–1.40)
Calcium, Ion: 1.02 mmol/L — ABNORMAL LOW (ref 1.15–1.40)
Calcium, Ion: 1.05 mmol/L — ABNORMAL LOW (ref 1.15–1.40)
Calcium, Ion: 1.06 mmol/L — ABNORMAL LOW (ref 1.15–1.40)
Calcium, Ion: 1.05 mmol/L — ABNORMAL LOW (ref 1.15–1.40)
HCT: 21 % — ABNORMAL LOW (ref 36.0–46.0)
HCT: 23 % — ABNORMAL LOW (ref 36.0–46.0)
HCT: 21 % — ABNORMAL LOW (ref 36.0–46.0)
HCT: 24 % — ABNORMAL LOW (ref 36.0–46.0)
HCT: 26 % — ABNORMAL LOW (ref 36.0–46.0)
HCT: 23 % — ABNORMAL LOW (ref 36.0–46.0)
Hemoglobin: 7.1 g/dL — ABNORMAL LOW (ref 12.0–15.0)
Hemoglobin: 7.8 g/dL — ABNORMAL LOW (ref 12.0–15.0)
Hemoglobin: 7.1 g/dL — ABNORMAL LOW (ref 12.0–15.0)
Hemoglobin: 8.2 g/dL — ABNORMAL LOW (ref 12.0–15.0)
Hemoglobin: 8.8 g/dL — ABNORMAL LOW (ref 12.0–15.0)
Hemoglobin: 7.8 g/dL — ABNORMAL LOW (ref 12.0–15.0)
O2 Saturation: 100 %
O2 Saturation: 100 %
O2 Saturation: 100 %
O2 Saturation: 100 %
O2 Saturation: 100 %
O2 Saturation: 100 %
Patient temperature: 36.5
Patient temperature: 36.5
Patient temperature: 36.5
Patient temperature: 36.5
Patient temperature: 36.6
Patient temperature: 36.5
Potassium: 3.4 mmol/L — ABNORMAL LOW (ref 3.5–5.1)
Potassium: 3.6 mmol/L (ref 3.5–5.1)
Potassium: 3.7 mmol/L (ref 3.5–5.1)
Potassium: 3.8 mmol/L (ref 3.5–5.1)
Potassium: 4 mmol/L (ref 3.5–5.1)
Potassium: 4 mmol/L (ref 3.5–5.1)
Sodium: 141 mmol/L (ref 135–145)
Sodium: 143 mmol/L (ref 135–145)
Sodium: 143 mmol/L (ref 135–145)
Sodium: 144 mmol/L (ref 135–145)
Sodium: 145 mmol/L (ref 135–145)
Sodium: 145 mmol/L (ref 135–145)
TCO2: 25 mmol/L (ref 22–32)
TCO2: 27 mmol/L (ref 22–32)
TCO2: 27 mmol/L (ref 22–32)
TCO2: 27 mmol/L (ref 22–32)
TCO2: 27 mmol/L (ref 22–32)
TCO2: 27 mmol/L (ref 22–32)
pCO2 arterial: 40.6 mmHg (ref 32.0–48.0)
pCO2 arterial: 43.4 mmHg (ref 32.0–48.0)
pCO2 arterial: 41.8 mmHg (ref 32.0–48.0)
pCO2 arterial: 44.8 mmHg (ref 32.0–48.0)
pCO2 arterial: 45.3 mmHg (ref 32.0–48.0)
pCO2 arterial: 45.3 mmHg (ref 32.0–48.0)
pH, Arterial: 7.337 — ABNORMAL LOW (ref 7.350–7.450)
pH, Arterial: 7.402 (ref 7.350–7.450)
pH, Arterial: 7.36 (ref 7.350–7.450)
pH, Arterial: 7.361 (ref 7.350–7.450)
pH, Arterial: 7.39 (ref 7.350–7.450)
pH, Arterial: 7.353 (ref 7.350–7.450)
pO2, Arterial: 246 mmHg — ABNORMAL HIGH (ref 83.0–108.0)
pO2, Arterial: 346 mmHg — ABNORMAL HIGH (ref 83.0–108.0)
pO2, Arterial: 359 mmHg — ABNORMAL HIGH (ref 83.0–108.0)
pO2, Arterial: 366 mmHg — ABNORMAL HIGH (ref 83.0–108.0)
pO2, Arterial: 379 mmHg — ABNORMAL HIGH (ref 83.0–108.0)
pO2, Arterial: 363 mmHg — ABNORMAL HIGH (ref 83.0–108.0)

## 2020-10-29 LAB — CBC
HCT: 24 % — ABNORMAL LOW (ref 36.0–46.0)
HCT: 25.1 % — ABNORMAL LOW (ref 36.0–46.0)
HCT: 23.5 % — ABNORMAL LOW (ref 36.0–46.0)
Hemoglobin: 7.9 g/dL — ABNORMAL LOW (ref 12.0–15.0)
Hemoglobin: 8.4 g/dL — ABNORMAL LOW (ref 12.0–15.0)
Hemoglobin: 7.9 g/dL — ABNORMAL LOW (ref 12.0–15.0)
MCH: 29.6 pg (ref 26.0–34.0)
MCH: 29.7 pg (ref 26.0–34.0)
MCH: 29.8 pg (ref 26.0–34.0)
MCHC: 32.9 g/dL (ref 30.0–36.0)
MCHC: 33.5 g/dL (ref 30.0–36.0)
MCHC: 33.6 g/dL (ref 30.0–36.0)
MCV: 89.9 fL (ref 80.0–100.0)
MCV: 88.7 fL (ref 80.0–100.0)
MCV: 88.7 fL (ref 80.0–100.0)
Platelets: 34 10*3/uL — ABNORMAL LOW (ref 150–400)
Platelets: 32 10*3/uL — ABNORMAL LOW (ref 150–400)
Platelets: 60 10*3/uL — ABNORMAL LOW (ref 150–400)
RBC: 2.67 MIL/uL — ABNORMAL LOW (ref 3.87–5.11)
RBC: 2.83 MIL/uL — ABNORMAL LOW (ref 3.87–5.11)
RBC: 2.65 MIL/uL — ABNORMAL LOW (ref 3.87–5.11)
RDW: 16.3 % — ABNORMAL HIGH (ref 11.5–15.5)
RDW: 16 % — ABNORMAL HIGH (ref 11.5–15.5)
RDW: 16.5 % — ABNORMAL HIGH (ref 11.5–15.5)
WBC: 5.6 10*3/uL (ref 4.0–10.5)
WBC: 4.6 10*3/uL (ref 4.0–10.5)
WBC: 5 10*3/uL (ref 4.0–10.5)
nRBC: 0.9 % — ABNORMAL HIGH (ref 0.0–0.2)
nRBC: 1.1 % — ABNORMAL HIGH (ref 0.0–0.2)
nRBC: 0.6 % — ABNORMAL HIGH (ref 0.0–0.2)

## 2020-10-29 LAB — HEPATIC FUNCTION PANEL
ALT: 21 U/L (ref 0–44)
AST: 58 U/L — ABNORMAL HIGH (ref 15–41)
Albumin: 2.6 g/dL — ABNORMAL LOW (ref 3.5–5.0)
Alkaline Phosphatase: 28 U/L — ABNORMAL LOW (ref 38–126)
Bilirubin, Direct: 2.4 mg/dL — ABNORMAL HIGH (ref 0.0–0.2)
Indirect Bilirubin: 2.1 mg/dL — ABNORMAL HIGH (ref 0.3–0.9)
Total Bilirubin: 4.5 mg/dL — ABNORMAL HIGH (ref 0.3–1.2)
Total Protein: 4.2 g/dL — ABNORMAL LOW (ref 6.5–8.1)

## 2020-10-29 LAB — BASIC METABOLIC PANEL
Anion gap: 10 (ref 5–15)
Anion gap: 6 (ref 5–15)
Anion gap: 9 (ref 5–15)
BUN: 20 mg/dL (ref 8–23)
BUN: 20 mg/dL (ref 8–23)
BUN: 21 mg/dL (ref 8–23)
CO2: 21 mmol/L — ABNORMAL LOW (ref 22–32)
CO2: 22 mmol/L (ref 22–32)
CO2: 23 mmol/L (ref 22–32)
Calcium: 6.8 mg/dL — ABNORMAL LOW (ref 8.9–10.3)
Calcium: 7.1 mg/dL — ABNORMAL LOW (ref 8.9–10.3)
Calcium: 7.1 mg/dL — ABNORMAL LOW (ref 8.9–10.3)
Chloride: 109 mmol/L (ref 98–111)
Chloride: 109 mmol/L (ref 98–111)
Chloride: 113 mmol/L — ABNORMAL HIGH (ref 98–111)
Creatinine, Ser: 1.53 mg/dL — ABNORMAL HIGH (ref 0.44–1.00)
Creatinine, Ser: 1.54 mg/dL — ABNORMAL HIGH (ref 0.44–1.00)
Creatinine, Ser: 1.6 mg/dL — ABNORMAL HIGH (ref 0.44–1.00)
GFR, Estimated: 33 mL/min — ABNORMAL LOW (ref >60)
GFR, Estimated: 35 mL/min — ABNORMAL LOW (ref >60)
GFR, Estimated: 35 mL/min — ABNORMAL LOW (ref >60)
Glucose, Bld: 146 mg/dL — ABNORMAL HIGH (ref 70–99)
Glucose, Bld: 182 mg/dL — ABNORMAL HIGH (ref 70–99)
Glucose, Bld: 189 mg/dL — ABNORMAL HIGH (ref 70–99)
Potassium: 3.3 mmol/L — ABNORMAL LOW (ref 3.5–5.1)
Potassium: 3.6 mmol/L (ref 3.5–5.1)
Potassium: 3.7 mmol/L (ref 3.5–5.1)
Sodium: 140 mmol/L (ref 135–145)
Sodium: 140 mmol/L (ref 135–145)
Sodium: 142 mmol/L (ref 135–145)

## 2020-10-29 LAB — GLOBAL TEG PANEL
CFF Max Amplitude: 20.9 mm (ref 15–32)
CK with Heparinase (R): 9.1 min — ABNORMAL HIGH (ref 4.3–8.3)
Citrated Functional Fibrinogen: 381.4 mg/dL (ref 278–581)
Citrated Kaolin (K): 3.8 min — ABNORMAL HIGH (ref 0.8–2.1)
Citrated Kaolin (MA): 51.1 mm — ABNORMAL LOW (ref 52–69)
Citrated Kaolin (R): 14.4 min — ABNORMAL HIGH (ref 4.6–9.1)
Citrated Kaolin Angle: 52.3 deg — ABNORMAL LOW (ref 63–78)
Citrated Rapid TEG (MA): 50.1 mm — ABNORMAL LOW (ref 52–70)

## 2020-10-29 LAB — MAGNESIUM
Magnesium: 2 mg/dL (ref 1.7–2.4)
Magnesium: 2 mg/dL (ref 1.7–2.4)

## 2020-10-29 LAB — COOXEMETRY PANEL
Carboxyhemoglobin: 2.2 % — ABNORMAL HIGH (ref 0.5–1.5)
Methemoglobin: 1.2 % (ref 0.0–1.5)
O2 Saturation: 82.8 %
Total hemoglobin: 8.6 g/dL — ABNORMAL LOW (ref 12.0–16.0)

## 2020-10-29 LAB — GLUCOSE, CAPILLARY
Glucose-Capillary: 174 mg/dL — ABNORMAL HIGH (ref 70–99)
Glucose-Capillary: 156 mg/dL — ABNORMAL HIGH (ref 70–99)
Glucose-Capillary: 166 mg/dL — ABNORMAL HIGH (ref 70–99)
Glucose-Capillary: 143 mg/dL — ABNORMAL HIGH (ref 70–99)
Glucose-Capillary: 170 mg/dL — ABNORMAL HIGH (ref 70–99)

## 2020-10-29 LAB — HEPARIN LEVEL (UNFRACTIONATED): Heparin Unfractionated: <0.1 IU/mL — ABNORMAL LOW (ref 0.30–0.70)

## 2020-10-29 LAB — PROTIME-INR
INR: 1.5 — ABNORMAL HIGH (ref 0.8–1.2)
INR: 1.6 — ABNORMAL HIGH (ref 0.8–1.2)
INR: 1.6 — ABNORMAL HIGH (ref 0.8–1.2)
Prothrombin Time: 18.1 seconds — ABNORMAL HIGH (ref 11.4–15.2)
Prothrombin Time: 18.7 seconds — ABNORMAL HIGH (ref 11.4–15.2)
Prothrombin Time: 19.4 seconds — ABNORMAL HIGH (ref 11.4–15.2)

## 2020-10-29 LAB — APTT
aPTT: 46 seconds — ABNORMAL HIGH (ref 24–36)
aPTT: 50 seconds — ABNORMAL HIGH (ref 24–36)
aPTT: 55 seconds — ABNORMAL HIGH (ref 24–36)

## 2020-10-29 LAB — FIBRINOGEN
Fibrinogen: 459 mg/dL (ref 210–475)
Fibrinogen: 515 mg/dL — ABNORMAL HIGH (ref 210–475)
Fibrinogen: 528 mg/dL — ABNORMAL HIGH (ref 210–475)

## 2020-10-29 LAB — PREPARE RBC (CROSSMATCH)

## 2020-10-29 LAB — VANCOMYCIN, TROUGH: Vancomycin Tr: 26 ug/mL (ref 15–20)

## 2020-10-29 LAB — LACTATE DEHYDROGENASE: LDH: 261 U/L — ABNORMAL HIGH (ref 98–192)

## 2020-10-29 MED ORDER — SODIUM CHLORIDE 0.9% IV SOLUTION: Freq: Once | INTRAVENOUS | Status: AC

## 2020-10-29 MED ORDER — PIVOT 1.5 CAL PO LIQD
1000.0000 mL | ORAL | Status: DC
  Administered 2020-10-29 – 2020-10-30 (×3): 1000 mL

## 2020-10-29 MED ORDER — MILRINONE LACTATE IN DEXTROSE 20-5 MG/100ML-% IV SOLN
0.2500 ug/kg/min | INTRAVENOUS | Status: DC
  Administered 2020-10-29 – 2020-10-30 (×4): 0.25 ug/kg/min via INTRAVENOUS
  Filled 2020-10-29: qty 100

## 2020-10-29 MED ORDER — SODIUM CHLORIDE 0.9 % IV SOLN
INTRAVENOUS | Status: DC | PRN
  Administered 2020-10-29: 1000 mL via INTRAVENOUS

## 2020-10-29 MED ORDER — POTASSIUM CHLORIDE 20 MEQ PO PACK
40.0000 meq | PACK | Freq: Once | ORAL | Status: AC
  Administered 2020-10-29: 40 meq
  Filled 2020-10-29: qty 2

## 2020-10-29 MED ORDER — VANCOMYCIN HCL IN DEXTROSE 1-5 GM/200ML-% IV SOLN
1000.0000 mg | INTRAVENOUS | Status: DC
  Administered 2020-10-30 – 2020-11-01 (×3): 1000 mg via INTRAVENOUS
  Filled 2020-10-29 (×3): qty 200

## 2020-10-29 MED ORDER — POTASSIUM CHLORIDE 20 MEQ PO PACK
40.0000 meq | PACK | ORAL | Status: AC
  Administered 2020-10-29 (×2): 40 meq
  Filled 2020-10-29 (×2): qty 2

## 2020-10-29 NOTE — Progress Notes (Signed)
Patient ID: Lenora Boys, female   DOB: 06-Mar-1945, 75 y.o.   MRN: [id number] Extracorporeal support note   ECLS support day: Indication: 4  Configuration: Central RA/Ao  Pump speed: 3015 rpm Pump flow: 3.70 L/min  Sweep gas: 3.5 -> 4.5  Circuit check: small amount thrombus Anticoagulant: None, only heparin in Impella purge due to ongoing bleeding Anticoagulation targets: N/A  Changes in support: None  Anticipated goals/duration of support: Wean to Impella.   Impella 5.5: P-3. Flow 1.2L  No alarms. Waveforms ok   Arvilla Meres 10/29/2020 8:38 AM

## 2020-10-29 NOTE — Progress Notes (Signed)
[address]       [place] [address]             [phone]       Bleeding overnight noted from the left portion of her dressing.  She is required 2 units of blood, and ABDs have been changed every hour and a half.  The area was prepped and draped in normal sterile fashion and the dressings were taken down.  Abdominal is flaccid thus it was not open.  I did remove all the clot and placed a 19 [place] Blake in the area that was bleeding the most.  Therewasno significant bleeding that was noted during the time of redressing.  We will continue to watch.    2 Days Post-Op Procedure(s) (LRB): DRAIN OF STERNAL HEMATOMA (N/A)  _______________________________________________________________ Vitals: BP (!) 76/70   Pulse 93   Temp 97.9 F (36.6 C)   Resp (!) 0   Ht 5\' 7"  (1.702 m)   Wt 83.8 kg   SpO2 100%   BMI 28.94 kg/m  Filed Weights   11/04/2020 0415 10/28/20 0500 10/29/20 0500  Weight: 85.9 kg 85.7 kg 83.8 kg     - Neuro: Sedated  - Cardiovascular: Hematoma noted along the left dressing.  Atrial fibrillation  Drips: Amiodarone, epinephrine at 6, milrinone 0.25, Levophed 10, vaso0.03 Impella at P3 ECMO flowing at 3.7 L. PAP: (10-37)/(4-28) 13/7 CVP:  [9 mmHg-14 mmHg] 9 mmHg  - Pulm:  Vent Mode: PCV FiO2 (%):  [40 %] 40 % Set Rate:  [16 bmp] 16 bmp PEEP:  [5 cmH20] 5 cmH20 Plateau Pressure:  [14 cmH20-15 cmH20] 15 cmH20  ABG    Component Value Date/Time   PHART 7.402 10/29/2020 0429   PCO2ART 40.6 10/29/2020 0429   PO2ART 346 (H) 10/29/2020 0429   HCO3 25.4 10/29/2020 0429   TCO2 27 10/29/2020 0429   ACIDBASEDEF 2.0 10/28/2020 2333   O2SAT 100.0 10/29/2020 0429     .Intake/Output      09/23 0701 09/24 0700 09/24 0701 09/25 0700   I.V. (mL/kg) 2433.5 (29) 467.4 (5.6)   Blood 565    Other 208.4 43.8   NG/GT 335 260   IV Piggyback 1510.4 150.1   Total Intake(mL/kg) 5052.3 (60.3) 921.3 (11)   Urine (mL/kg/hr) 5895 (2.9) 785 (1.5)    Chest Tube 560 70   Total Output 6455 855   Net -1402.8 +66.3           _______________________________________________________________ Labs: CBC Latest Ref Rng & Units 10/29/2020 10/29/2020 10/28/2020  WBC 4.0 - 10.5 K/uL - 4.6 -  Hemoglobin 12.0 - 15.0 g/dL 7.8(L) 8.4(L) 7.1(L)  Hematocrit 36.0 - 46.0 % 23.0(L) 25.1(L) 21.0(L)  Platelets 150 - 400 K/uL - 32(L) -   CMP Latest Ref Rng & Units 10/29/2020 10/29/2020 10/28/2020  Glucose 70 - 99 mg/dL - 10/19/2020) -  BUN 8 - 23 mg/dL - 20 -  Creatinine 119(E - 1.00 mg/dL - 1.74) -  Sodium 0.81(K - 145 mmol/L 143 140 141  Potassium 3.5 - 5.1 mmol/L 3.4(L) 3.3(L) 3.6  Chloride 98 - 111 mmol/L - 109 -  CO2 22 - 32 mmol/L - 22 -  Calcium 8.9 - 10.3 mg/dL - 7.1(L) -  Total Protein 6.5 - 8.1 g/dL - 4.2(L) -  Total Bilirubin 0.3 - 1.2 mg/dL - 4.5(H) -  Alkaline Phos 38 - 126 U/L - 28(L) -  AST 15 - 41 U/L - 58(H) -  ALT 0 -  44 U/L - 21 -    CXR: -  _______________________________________________________________  Assessment and Plan: Status post CABG x2, 5.5 Impella cannulation, VA ECMO cannulation  Patient remains critically ill.  Continue full support.  Appreciate cardiology and critical care We will continue to watch drain output. Resuscitate with product as needed.    Corliss Skains 10/29/2020 1:17 PM

## 2020-10-29 NOTE — Progress Notes (Signed)
Nutrition Follow-up  DOCUMENTATION CODES:   Not applicable  INTERVENTION:   Tube Feeding via Cortrak:  Pivot 1.5 at 60 ml/hr Continue at 20 ml/hr with titration by 10 mL q 8 hours until goal rate of 60 ml/hr Provides 135 g of protein, 2160 kcals, 1094 mL of free water  NUTRITION DIAGNOSIS:   Inadequate oral intake related to acute illness as evidenced by NPO status.  Ongoing  GOAL:   Patient will meet greater than or equal to 90% of their needs  Progressing   MONITOR:   Vent status, Labs, Weight trends, TF tolerance  REASON FOR ASSESSMENT:   Ventilator    ASSESSMENT:   75 yo female admitted with NSTEMI and scheduled for urgent CABG, suffered cardiac arrest post induction, flash pulmonary edema, shock, required intubation and VA ECMO cannulation. PMH includes CAD, HLD, HTN  9/16 Admitted 9/20 2vCAD s/p CABG x 4, cardiac arrest post induction, flash pulmonary edema, VA ECMO cannulation, Intubated 9/22 Chest washout 9/23- cortrak tube placed (gastric), CT with contrast reveals duodenal placement  Patient is currently intubated on ventilator support MV: 3.1 L/min Temp (24hrs), Avg:97.7 F (36.5 C), Min:97.3 F (36.3 C), Max:98.1 F (36.7 C)  Reviewed I/O's: -1.4 L x 24 hours and +4.4 L since admission  UOP: 5.9 L x 24 hours  Chest tube output: 560 ml x 24 hours   Per Advanced Heart Failure Team, pt still with bleeding from chest wound; plan to discuss washout with CVTS. Pt auto-diuresing now.    Medications reviewed and include colace, miralax, senokot, amiodarone, adrenalin, dilaudid, versed, primacor, and pitressin.    Lab Results  Component Value Date   HGBA1C 6.1 (H) 10/09/2020   PTA DM medications are .   Labs reviewed: CBGS: 156 (inpatient orders for glycemic control are 0-24 units insulin aspart every 4 hours).    Diet Order:   Diet Order     None       EDUCATION NEEDS:   Not appropriate for education at this time  Skin:  Skin  Assessment: Skin Integrity Issues: Skin Integrity Issues:: Other (Comment), Incisions Incisions: multiple chest Other: open sternum  Last BM:  10/23/20  Height:   Ht Readings from Last 1 Encounters:  10/26/20 5\' 7"  (1.702 m)    Weight:   Wt Readings from Last 1 Encounters:  10/29/20 83.8 kg   BMI:  Body mass index is 28.94 kg/m.  Estimated Nutritional Needs:   Kcal:  2040-2380 kcals  Protein:  120-150 g  Fluid:  >/= 1.7 L    01-02-2005, RD, LDN, CDCES Registered Dietitian II Certified Diabetes Care and Education Specialist Please refer to [facility] for RD and/or RD on-call/weekend/after hours pager

## 2020-10-29 NOTE — Progress Notes (Signed)
ANTICOAGULATION CONSULT NOTE Pharmacy Consult for heparin Indication:  Impella 5.5 + ECMO VA  Allergies  Allergen Reactions   Penicillins Other (See Comments)    Dizzy     Patient Measurements: Height: '5\' 7"'  (170.2 cm) Weight: 83.8 kg (184 lb 11.9 oz) IBW/kg (Calculated) : 61.6 Heparin Dosing Weight: 78.7 kg   Vital Signs: Temp: 97.9 F (36.6 C) (09/24 0800) Temp Source: Bladder (09/24 0800) Pulse Rate: 72 (09/24 0806)  Labs: Recent Labs    10/19/2020 0419 10/14/2020 0519 10/28/20 0434 10/28/20 0436 10/28/20 1556 10/28/20 1558 10/28/20 2219 10/28/20 2331 10/28/20 2333 10/29/20 0427 10/29/20 0429  HGB  --    < > 8.2*   < > 8.4*   < > 8.1*  7.5* 7.9* 7.1* 8.4* 7.8*  HCT  --    < > 24.0*   < > 24.6*   < > 24.0*  22.0* 24.0* 21.0* 25.1* 23.0*  PLT  --    < > 45*   < > 44*  --  39* 34*  --  32*  --   APTT  --    < >  --   --  53*  --   --  55*  --  50*  --   LABPROT  --    < >  --   --  18.0*  --   --  19.4*  --  18.7*  --   INR  --    < >  --   --  1.5*  --   --  1.6*  --  1.6*  --   HEPARINUNFRC <0.10*  --  <0.10*  --   --   --   --   --   --  <0.10*  --   CREATININE 1.25*   < >  --   --  1.53*  --   --  1.60*  --  1.53*  --    < > = values in this interval not displayed.     Estimated Creatinine Clearance: 34.8 mL/min (A) (by C-G formula based on SCr of 1.53 mg/dL (H)).   Medical History: Past Medical History:  Diagnosis Date   Carotid artery occlusion    Coronary artery disease    Hyperlipidemia    Hypertension    Myocardial infarction ([place]) 1997    Medications:  Scheduled:   aspirin EC  325 mg Oral Daily   Or   aspirin  324 mg Per Tube Daily   bisacodyl  10 mg Oral Daily   Or   bisacodyl  10 mg Rectal Daily   chlorhexidine gluconate (MEDLINE KIT)  15 mL Mouth Rinse BID   Chlorhexidine Gluconate Cloth  6 each Topical Daily   docusate  100 mg Per Tube BID   insulin aspart  0-24 Units Subcutaneous Q4H   mouth rinse  15 mL Mouth Rinse 10 times per  day   pantoprazole (PROTONIX) IV  40 mg Intravenous Q24H   polyethylene glycol  17 g Per Tube BID   potassium chloride  40 mEq Per Tube Q4H   rosuvastatin  40 mg Oral Daily   sennosides  10 mL Oral BID   sodium chloride flush  10-40 mL Intracatheter Q12H   sodium chloride flush  3 mL Intravenous Q12H    Assessment: 1 yof who underwent CABG x4 complicated cardiac arrest - now has impella 5.5 and on VA-ECMO. No AC PTA.  VA-ECMO + Impella 5.5 at P-3   purge flow  7.5 mL/hr (375 units/hr of heparin from purge solution). Purge pressure 500s Still oozing from open chest, h/h stable 2 uts PRBC last 24hr, PLTC low 30s stable  HL <0.1, aptt 50s   ECMO circuit assessed for clots, none noted. Fibrinogen strands starting to develop, so may need to consider systemic AC soon. No TEG today.  Goal of Therapy:  Heparin level <0.3 units/ml Monitor platelets by anticoagulation protocol: Yes   Plan:  Continue heparin purge solution (50 units/mL) with Impella No systemic heparin at this time - re-evaluate later today Monitor s/sx of bleeding - monitor heparin level with AM labs    Bonnita Nasuti Pharm.D. CPP, BCPS Clinical Pharmacist [phone] 10/29/2020 8:37 AM   Please check [email or web address] for unit-specific pharmacy phone numbers.

## 2020-10-29 NOTE — Progress Notes (Signed)
Dr. Cliffton Asters at bedside to perform washout. 4 pRBCs at bedside in cooler. ECMO specialist at bedside. Sedation and pressors titrated accordingly.

## 2020-10-29 NOTE — Progress Notes (Signed)
40 mL of Dilaudid wasted in Stericycle with Catering manager as witness.

## 2020-10-29 NOTE — Progress Notes (Signed)
Pharmacy Antibiotic Note  Veronica Swanson is a 75 y.o. female admitted on 12-Nov-2020.  Pharmacy has been consulted for vancomycin/cefuroxime dosing for open chest and on ECMO. Vancomycin 1250mg  x1 given prior to surgery. Patient on levofloxacin for PCN allergy documented as dizziness and one time doses of cefazolin given x2   Vancomycin increased from 500 q12h to 750 mg q 12h based upon vancomycin trough of 11 earlier this week.  Vancomycin trough now 26 (> goal 15-10)after 4 doses at 750 mg. Q12h - confirmed drawn prior to vancomycin dose given today and from line not associated with vancomycin administration.   Cr slightly increased 1.3>1.5 after furosemide administration yesterday  Will adjust vancomycin dosing accordingly  Continues on ECMO Remains with open chest on vasopressors  Plan: Vancomycin  1gm IV q24hr start 9/25 Continue cefuroxime 1.5g q8h Monitor renal function, cultures, and clinical progression  Height: 5\' 7"  (170.2 cm) Weight: 83.8 kg (184 lb 11.9 oz) IBW/kg (Calculated) : 61.6  Temp (24hrs), Avg:97.8 F (36.6 C), Min:97.5 F (36.4 C), Max:98.1 F (36.7 C)  Recent Labs  Lab 10/20/2020 1958 10/08/2020 2328 10/26/20 0409 10/26/20 0803 10/20/2020 0730 10/06/2020 0752 10/26/2020 1104 10/06/2020 1624 10/08/2020 1936 10/28/20 0212 10/28/20 0434 10/28/20 1119 10/28/20 1556 10/28/20 2219 10/28/20 2331 10/29/20 0427 10/29/20 0701  WBC 3.9*   < > 2.1*   < >  --   --   --    < > 3.6* 4.4   < > 5.1 5.7 6.1 5.6 4.6  --   CREATININE 1.40*   < > 1.39*   < >  --   --   --    < > 1.34* 1.33*  --   --  1.53*  --  1.60* 1.53*  --   LATICACIDVEN 10.4*  --  6.2*  --   --  1.8 1.6  --   --   --   --   --  1.0  --   --   --   --   VANCOTROUGH  --   --   --   --  11*  --   --   --   --   --   --   --   --   --   --   --  26*   < > = values in this interval not displayed.     Estimated Creatinine Clearance: 34.8 mL/min (A) (by C-G formula based on SCr of 1.53 mg/dL (H)).    Allergies   Allergen Reactions   Penicillins Other (See Comments)    Dizzy     Antimicrobials this admission: Vancomycin 9/20 >>  Cefuroxime 9/20 >>  Dose adjustments this admission: VT 11 (10/23/2020)- changed vancomycin to 750 mg q12h VT 26 (9/24) vancomycin 750mg  IV q12H  Microbiology results:   10/29/20 Pharm.D. CPP, BCPS Clinical Pharmacist [phone] 10/29/2020 1:21 PM   Please check [email or web address] for unit-specific pharmacy phone numbers.

## 2020-10-29 NOTE — Progress Notes (Signed)
Patient ID: Veronica Swanson, female   DOB: 1945-08-27, 75 y.o.   MRN: [id number]    Advanced Heart Failure Rounding Note   Subjective:    - Chest washout 9/22.   On central VA ECMO with Impella at P-3 for vent  Intubated. Sedated. Chest open.  Continues to bleed from chest wound. Got 2u RBCs overnight.   Received IV lasix yesterday with brisk urine output but developed hypotension and rapid AF and had to get fluid back. Started on amio gtt. Remains in AF rate controlled   Still off AC due to bleeding  On epinephrine 4, NE 6 VP 0.03, Milrinone 0.375. SvO2 83%.   In AF 80s   ECMO - see flowsheet.  Impella P3, flow 1.2 L/min. No alarms  Scr 1.33 +. 1,5 Hgb 8.6 -> 8.4 Platelets 45 -> 32   Objective:   Weight Range:  Vital Signs:   Temp:  [97.3 F (36.3 C)-98.1 F (36.7 C)] 97.9 F (36.6 C) (09/24 0800) Pulse Rate:  [34-140] 72 (09/24 0806) Resp:  [0-22] 17 (09/24 0806) SpO2:  [95 %-100 %] 98 % (09/24 0806) Arterial Line BP: (59-115)/(53-81) 72/61 (09/24 0800) FiO2 (%):  [40 %] 40 % (09/24 0806) Weight:  [83.8 kg] 83.8 kg (09/24 0500) Last BM Date: 10/23/20  Weight change: Filed Weights   10/07/2020 0415 10/28/20 0500 10/29/20 0500  Weight: 85.9 kg 85.7 kg 83.8 kg    Intake/Output:   Intake/Output Summary (Last 24 hours) at 10/29/2020 0839 Last data filed at 10/29/2020 0800 Gross per 24 hour  Intake 5011.91 ml  Output 6370 ml  Net -1358.09 ml      Physical Exam: General: Intubated/sedated HEENT: normal + ETT Neck: supple. RIJ swan  Cor: Chest open. Bleeding under esmarch. + ECMO cannulas and CTs Lungs: coarse Abdomen: obese soft, nontender, nondistended. No hepatosplenomegaly. No bruits or masses. Good bowel sounds. Extremities: no cyanosis, clubbing, rash, 3+ edema + blisters Neuro: sedated. unresponsive   Telemetry: AF 70-80s Personally reviewed   Labs: Basic Metabolic Panel: Recent Labs  Lab 10/26/20 0409 10/26/20 0412 10/26/2020 0419  10/22/2020 1113 10/16/2020 1936 10/14/2020 1941 10/28/20 0212 10/28/20 0436 10/28/20 1556 10/28/20 1558 10/28/20 2219 10/28/20 2331 10/28/20 2333 10/29/20 0427 10/29/20 0429  NA 144   < > 141   < > 141   < > 141   < > 140   < > 143 140 141 140 143  K 3.3*   < > 4.8   < > 4.6   < > 4.4   < > 3.6   < > 4.0 3.6 3.6 3.3* 3.4*  CL 106   < > 109   < > 111  --  112*  --  109  --   --  109  --  109  --   CO2 23   < > 23   < > 24  --  23  --  23  --   --  21*  --  22  --   GLUCOSE 164*   < > 152*   < > 107*  --  88  --  102*  --   --  189*  --  182*  --   BUN 10   < > 8   < > 14  --  16  --  18  --   --  20  --  20  --   CREATININE 1.39*   < > 1.25*   < >  1.34*  --  1.33*  --  1.53*  --   --  1.60*  --  1.53*  --   CALCIUM 7.1*   < > 7.0*   < > 6.4*  --  6.7*  --  7.3*  --   --  7.1*  --  7.1*  --   MG 1.7  --  1.8  --   --   --  1.7  --   --   --   --  2.0  --  2.0  --    < > = values in this interval not displayed.     Liver Function Tests: Recent Labs  Lab 10/28/2020 1958 10/26/20 0409 10/31/2020 0419 10/28/20 0212 10/29/20 0427  AST 97* 63* 37 43* 58*  ALT $Re'22 22 16 18 21  'Nch$ ALKPHOS 16* 25* 21* 24* 28*  BILITOT 1.3* 1.1 1.7* 3.0* 4.5*  PROT 3.6* 3.7* 3.8* 3.9* 4.2*  ALBUMIN 2.6* 2.2* 2.4* 2.3* 2.6*    No results for input(s): LIPASE, AMYLASE in the last 168 hours. No results for input(s): AMMONIA in the last 168 hours.  CBC: Recent Labs  Lab 10/29/2020 2328 10/13/2020 2330 10/28/20 1119 10/28/20 1556 10/28/20 1558 10/28/20 2219 10/28/20 2331 10/28/20 2333 10/29/20 0427 10/29/20 0429  WBC 2.7*   < > 5.1 5.7  --  6.1 5.6  --  4.6  --   NEUTROABS 2.1  --   --   --   --   --   --   --   --   --   HGB 7.3*   < > 8.6* 8.4*   < > 8.1*  7.5* 7.9* 7.1* 8.4* 7.8*  HCT 22.4*   < > 24.7* 24.6*   < > 24.0*  22.0* 24.0* 21.0* 25.1* 23.0*  MCV 82.7   < > 86.7 87.9  --  89.2 89.9  --  88.7  --   PLT 19*   < > 45* 44*  --  39* 34*  --  32*  --    < > = values in this interval not  displayed.     Cardiac Enzymes: No results for input(s): CKTOTAL, CKMB, CKMBINDEX, TROPONINI in the last 168 hours.  BNP: BNP (last 3 results) Recent Labs    10/22/20 0954  BNP 1,154.2*     ProBNP (last 3 results) No results for input(s): PROBNP in the last 8760 hours.    Other results:  Imaging: DG Abd 1 View  Result Date: 10/28/2020 CLINICAL DATA:  Feeding tube placement. EXAM: ABDOMEN - 1 VIEW COMPARISON:  None. FINDINGS: Feeding tube evaluation was performed by the technologist, no radiologist was present during this exam. Two fluoroscopic spot views of the abdomen were obtained. A weighted enteric tube is present with tip in the proximal duodenum. 10 cc Gastrografin was administered to check tube placement. Contrast confirms proximal duodenal placement, with contrast extending distally after flushed with water. Fluoroscopy time 5 minutes 43 seconds. Dose 83.69 mGy. IMPRESSION: Fluoroscopic spot views confirm the weighted enteric tube tip is in the proximal duodenum. Electronically Signed   By: Keith Rake M.D.   On: 10/28/2020 19:00   DG CHEST PORT 1 VIEW  Result Date: 10/29/2020 CLINICAL DATA:  Reason for exam: Personal history of ECMO Hx of HTN, MI EXAM: PORTABLE CHEST 1 VIEW COMPARISON:  10/28/2020 FINDINGS: ECMO support apparatus is unchanged. Endotracheal tube tip projects 4 cm above the Carina. Right internal jugular Swan-Ganz catheter tip projects over the  central cardiac silhouette. Nasal/orogastric tube passes well below the diaphragm and below the included field of view. Right sided mediastinal/pericardial chest tube is stable. Interstitial and hazy airspace lung opacities appear mildly improved from the previous day's study. There is denser opacity at the lung bases, left greater than right, which is stable. No pneumothorax. IMPRESSION: 1. Stable support apparatus compared to the previous day's exam. 2. Mild improvement in lung aeration. Some of this apparent  improvement may be technical, due to changes in patient positioning and radiographic technique. No new lung abnormalities. 3. No pneumothorax. Electronically Signed   By: Lajean Manes M.D.   On: 10/29/2020 08:29   DG CHEST PORT 1 VIEW  Result Date: 10/28/2020 CLINICAL DATA:  Tamponade EXAM: PORTABLE CHEST 1 VIEW COMPARISON:  10/28/2020, 11/02/2020, 10/26/2020, 10/13/2020 FINDINGS: Endotracheal tube is about 3.5 cm superior to the carina. Esophageal tube tip below the diaphragm. Right IJ Swan-Ganz catheter projects over the right ventricular outflow tract. Bilateral chest tubes remain in place. ECMO cannulas similar in position. Radiopaque material or drain over the mid chest unchanged. Non inclusion of the tip of Impella device which is otherwise similar in orientation. Enlarged cardiomediastinal silhouette, stable as compared with radiograph earlier today, progressive compared to radiographs several days prior. Asymmetric hazy opacity left thorax which may be due to layering pleural effusion and/or underlying edema. Overall worsened aeration on the left. Similar airspace disease on the right. Aortic atherosclerosis. IMPRESSION: 1. Grossly stable positioning of support lines and tubes. Right IJ Swan-Ganz catheter tip projecting over RVOT 2. Enlarged cardiomediastinal silhouette, grossly stable as compared with radiograph earlier today, progressive when compared to radiographs several days prior. 3. Worsened aeration. Probable layering pleural effusion with extensive bilateral airspace disease, likely edema Electronically Signed   By: Donavan Foil M.D.   On: 10/28/2020 22:28   DG CHEST PORT 1 VIEW  Result Date: 10/28/2020 CLINICAL DATA:  Personal history of ECMO EXAM: PORTABLE CHEST 1 VIEW COMPARISON:  10/15/2020 FINDINGS: ECMO cannula, endotracheal tube, enteric tube, bilateral chest tubes, mediastinal drain, Impella device, and right IJ Swan-Ganz catheter again identified. Swan-Ganz catheter tip projects  over RVOT. No pneumothorax. Persistent bilateral opacities, greater on the left. Lung aeration appears improved compared to yesterday. Probable layering left pleural effusion. IMPRESSION: No substantial change in lines and tubes. Swan-Ganz catheter tip projects over RVOT. Persistent bilateral opacities with some improvement in aeration compared to yesterday. Electronically Signed   By: Macy Mis M.D.   On: 10/28/2020 09:06   ECHO TEE  Result Date: 10/16/2020    TRANSESOPHOGEAL ECHO REPORT   Patient Name:   Veronica Swanson Date of Exam: 10/27/2020 Medical Rec #:  [id number]        Height:       67.0 in Accession #:    [id number]       Weight:       189.4 lb Date of Birth:  12-16-1945        BSA:          1.976 m Patient Age:    70 years         BP:           MAP 70s/NA mmHg Patient Gender: F                HR:           70 bpm. Exam Location:  Inpatient Procedure: Transesophageal Echo Indications:     ECMO evaluation  History:  Patient has prior history of Echocardiogram examinations. CHF.  Sonographer:     NA Referring Phys:  [place] Diagnosing Phys: Kirk Ruths McleanMD PROCEDURE: The transesophogeal probe was passed without difficulty through the esophogus of the patient. Sedation performed by different physician. The patient developed no complications during the procedure. IMPRESSIONS  1. Left ventricular ejection fraction, by estimation, is 20 to 25%. The left ventricle is small/decompressed. The left ventricle has severely decreased function. The left ventricle demonstrates global hypokinesis. There is mild left ventricular hypertrophy. Impella at 5.4 cm in the left ventricle.  2. Right ventricular systolic function is moderately reduced. The right ventricular size is normal.  3. No left atrial/left atrial appendage thrombus was detected.  4. ECMO inflow cannula noted in right atrium.  5. Mild posterior leaflet prolapse. The mitral valve is abnormal. Mild mitral valve regurgitation. No  evidence of mitral stenosis.  6. Impella catheter across aortic valve. The aortic valve is tricuspid. Aortic valve regurgitation is trivial.  7. No PFO or ASD by color doppler. FINDINGS  Left Ventricle: Left ventricular ejection fraction, by estimation, is 20 to 25%. The left ventricle has severely decreased function. The left ventricle demonstrates global hypokinesis. The left ventricular internal cavity size was small. There is mild left ventricular hypertrophy. Right Ventricle: The right ventricular size is normal. No increase in right ventricular wall thickness. Right ventricular systolic function is moderately reduced. Left Atrium: Left atrial size was normal in size. No left atrial/left atrial appendage thrombus was detected. Right Atrium: Right atrial size was normal in size. Pericardium: There is no evidence of pericardial effusion. Mitral Valve: Mild posterior leaflet prolapse. The mitral valve is abnormal. Mild mitral valve regurgitation. No evidence of mitral valve stenosis. Tricuspid Valve: The tricuspid valve is normal in structure. Tricuspid valve regurgitation is mild. Aortic Valve: Impella catheter across aortic valve. The aortic valve is tricuspid. Aortic valve regurgitation is trivial. Pulmonic Valve: The pulmonic valve was normal in structure. Pulmonic valve regurgitation is trivial. Aorta: The aortic root is normal in size and structure. IAS/Shunts: No PFO or ASD by color doppler. [employer] Electronically signed by Franki Monte Signature Date/Time: 10/24/2020/4:13:19 PM    Final (Updated)      Medications:     Scheduled Medications:  aspirin EC  325 mg Oral Daily   Or   aspirin  324 mg Per Tube Daily   bisacodyl  10 mg Oral Daily   Or   bisacodyl  10 mg Rectal Daily   chlorhexidine gluconate (MEDLINE KIT)  15 mL Mouth Rinse BID   Chlorhexidine Gluconate Cloth  6 each Topical Daily   docusate  100 mg Per Tube BID   insulin aspart  0-24 Units Subcutaneous Q4H   mouth rinse   15 mL Mouth Rinse 10 times per day   pantoprazole (PROTONIX) IV  40 mg Intravenous Q24H   polyethylene glycol  17 g Per Tube BID   potassium chloride  40 mEq Per Tube Q4H   rosuvastatin  40 mg Oral Daily   sennosides  10 mL Oral BID   sodium chloride flush  10-40 mL Intracatheter Q12H   sodium chloride flush  3 mL Intravenous Q12H    Infusions:  sodium chloride     sodium chloride Stopped (10/29/20 0611)   amiodarone 30 mg/hr (10/29/20 0800)   cefUROXime (ZINACEF)  IV Stopped (10/29/20 0641)   dexmedetomidine (PRECEDEX) IV infusion Stopped (10/28/20 0748)   epinephrine 4 mcg/min (10/29/20 0800)   feeding supplement (PIVOT  1.5 CAL) 20 mL/hr at 10/29/20 0400   furosemide (LASIX) 200 mg in dextrose 5% 100 mL ($Remov'2mg'JykfJK$ /mL) infusion     heparin 50 units/mL (Impella PURGE) in dextrose 5 % 1000 mL bag     HYDROmorphone 2 mg/hr (10/29/20 0800)   lactated ringers 20 mL/hr at 10/29/20 0800   midazolam 2 mg/hr (10/29/20 0800)   milrinone 0.375 mcg/kg/min (10/29/20 0800)   norepinephrine (LEVOPHED) Adult infusion 6 mcg/min (10/29/20 0800)   vancomycin 150 mL/hr at 10/29/20 0800   vasopressin 0.03 Units/min (10/29/20 0800)    PRN Medications: sodium chloride, sodium chloride, artificial tears, HYDROmorphone, HYDROmorphone (DILAUDID) injection, HYDROmorphone (DILAUDID) injection, midazolam, ondansetron (ZOFRAN) IV   Assessment/Plan:   1. Shock: Cardiogenic/vasoplegic/hemorrhagic post-CABG with acute lung injury. Central cannulation for VA ECMO, ascending aorta and RA.  Impella 5.5 for venting (ascending aorta). Goal to eventually wean ECMO to Impella support alone.  Currently on epinephrine 4, NE 6, VP 0.03, milrinone 0.375. SvO2 83%.  I>>O, weight up.  Still with good UOP and creatinine up to 1.6 LDH stable but small amt clot noted in circuit.  Did not tolerate volume removal with IV lasix - Appears vasoplegic. Co-ox high. Wil lstart by cutting back milrinone to 0.25 - Did not tolerate volume  removal yesterday. Auto-diuresing.  - Still bleeding from chest wound. Will discuss washout with TCTS - ECMO circuit stable, no systemic heparin with bleeding.  - Impella at P3, flow 1.2, no alarms.  Heparin in purge.   2. Acute blood loss Anemia/coagulopathy: Has had chest washout x 3.   - Still bleeding from chest wound. Will discuss washout with TCTS - Rec'd 2u RBCs overnight - Transfuse to keep Hgb > 8 3. Acute hypoxemic respiratory failure: ALI with pulmonary edema and pulmonary hemorrhage.  CXR with bilateral pulmonary edema (severe).   - Lung protective ventilation per CCM.  - Not ready for ECMO wean yet, need lungs clearer.  - Auto diuresing for now  4. Acute on chronic systolic CHF: Ischemic cardiomyopathy.  Pre-op echo with EF 35-40%.  Intra-op echo with LV EF 25% range but good RV function.  TEE 9/22 with RV small, moderate dysfunction and LV small, EF 20-25%.    - see above 5. Renal: RSCr up slightly yesterday after IV lasix. Hold lasix today 6. Thrombocytopenia: Due to coagulopathy. Watch closely. Replace as needed - she is actively with PLTs < 50K. Will give PLTs 7. FEN: Cortrak in place. Continue TFs 8. Neuro: Per RN report was following commands yesterday 9. PAF: Started 9/23. Rate controlled on amio gtt. Continue - no AC with bleeding   CRITICAL CARE Performed by: Glori Bickers  Total critical care time: 45 minutes  Critical care time was exclusive of separately billable procedures and treating other patients.  Critical care was necessary to treat or prevent imminent or life-threatening deterioration.  Critical care was time spent personally by me (independent of midlevel providers or residents) on the following activities: development of treatment plan with patient and/or surrogate as well as nursing, discussions with consultants, evaluation of patient's response to treatment, examination of patient, obtaining history from patient or surrogate, ordering and  performing treatments and interventions, ordering and review of laboratory studies, ordering and review of radiographic studies, pulse oximetry and re-evaluation of patient's condition.   Length of Stay: [place] 10/29/2020, 8:39 AM  Advanced Heart Failure Team Pager [phone] (M-F; 7a - 4p)  Please contact [facility] for night-coverage after hours (4p -7a ) and  weekends on [email or web address]

## 2020-10-30 ENCOUNTER — Inpatient Hospital Stay (HOSPITAL_COMMUNITY): Payer: Medicare Other | Admitting: Anesthesiology

## 2020-10-30 ENCOUNTER — Inpatient Hospital Stay (HOSPITAL_COMMUNITY): Payer: Medicare Other

## 2020-10-30 ENCOUNTER — Encounter (HOSPITAL_COMMUNITY)
Admission: EM | Disposition: E | Payer: Self-pay | Source: Home / Self Care | Attending: Thoracic Surgery (Cardiothoracic Vascular Surgery)

## 2020-10-30 DIAGNOSIS — R079 Chest pain, unspecified: Secondary | ICD-10-CM | POA: Diagnosis not present

## 2020-10-30 DIAGNOSIS — R570 Cardiogenic shock: Secondary | ICD-10-CM | POA: Diagnosis not present

## 2020-10-30 DIAGNOSIS — I739 Peripheral vascular disease, unspecified: Secondary | ICD-10-CM

## 2020-10-30 DIAGNOSIS — I998 Other disorder of circulatory system: Secondary | ICD-10-CM | POA: Diagnosis not present

## 2020-10-30 DIAGNOSIS — R0989 Other specified symptoms and signs involving the circulatory and respiratory systems: Secondary | ICD-10-CM

## 2020-10-30 DIAGNOSIS — I743 Embolism and thrombosis of arteries of the lower extremities: Secondary | ICD-10-CM | POA: Diagnosis not present

## 2020-10-30 DIAGNOSIS — Z951 Presence of aortocoronary bypass graft: Secondary | ICD-10-CM | POA: Diagnosis not present

## 2020-10-30 DIAGNOSIS — Z9281 Personal history of extracorporeal membrane oxygenation (ECMO): Secondary | ICD-10-CM | POA: Diagnosis not present

## 2020-10-30 DIAGNOSIS — T82898A Other specified complication of vascular prosthetic devices, implants and grafts, initial encounter: Secondary | ICD-10-CM | POA: Diagnosis not present

## 2020-10-30 HISTORY — PX: LOWER EXTREMITY ANGIOGRAM: SHX5508

## 2020-10-30 HISTORY — PX: THROMBECTOMY FEMORAL ARTERY: SHX6406

## 2020-10-30 LAB — POCT I-STAT 7, (LYTES, BLD GAS, ICA,H+H)
Acid-Base Excess: 1 mmol/L (ref 0.0–2.0)
Acid-Base Excess: 1 mmol/L (ref 0.0–2.0)
Acid-base deficit: 2 mmol/L (ref 0.0–2.0)
Acid-base deficit: 1 mmol/L (ref 0.0–2.0)
Bicarbonate: 26.8 mmol/L (ref 20.0–28.0)
Bicarbonate: 24.5 mmol/L (ref 20.0–28.0)
Bicarbonate: 24.6 mmol/L (ref 20.0–28.0)
Bicarbonate: 26.3 mmol/L (ref 20.0–28.0)
Calcium, Ion: 1.03 mmol/L — ABNORMAL LOW (ref 1.15–1.40)
Calcium, Ion: 1.03 mmol/L — ABNORMAL LOW (ref 1.15–1.40)
Calcium, Ion: 1.03 mmol/L — ABNORMAL LOW (ref 1.15–1.40)
Calcium, Ion: 0.97 mmol/L — ABNORMAL LOW (ref 1.15–1.40)
HCT: 24 % — ABNORMAL LOW (ref 36.0–46.0)
HCT: 21 % — ABNORMAL LOW (ref 36.0–46.0)
HCT: 23 % — ABNORMAL LOW (ref 36.0–46.0)
HCT: 23 % — ABNORMAL LOW (ref 36.0–46.0)
Hemoglobin: 8.2 g/dL — ABNORMAL LOW (ref 12.0–15.0)
Hemoglobin: 7.1 g/dL — ABNORMAL LOW (ref 12.0–15.0)
Hemoglobin: 7.8 g/dL — ABNORMAL LOW (ref 12.0–15.0)
Hemoglobin: 7.8 g/dL — ABNORMAL LOW (ref 12.0–15.0)
O2 Saturation: 100 %
O2 Saturation: 100 %
O2 Saturation: 100 %
O2 Saturation: 100 %
Patient temperature: 36.4
Patient temperature: 36.7
Patient temperature: 36.6
Patient temperature: 36.7
Potassium: 4.1 mmol/L (ref 3.5–5.1)
Potassium: 3.8 mmol/L (ref 3.5–5.1)
Potassium: 4.1 mmol/L (ref 3.5–5.1)
Potassium: 4.6 mmol/L (ref 3.5–5.1)
Sodium: 145 mmol/L (ref 135–145)
Sodium: 144 mmol/L (ref 135–145)
Sodium: 144 mmol/L (ref 135–145)
Sodium: 144 mmol/L (ref 135–145)
TCO2: 28 mmol/L (ref 22–32)
TCO2: 26 mmol/L (ref 22–32)
TCO2: 26 mmol/L (ref 22–32)
TCO2: 28 mmol/L (ref 22–32)
pCO2 arterial: 45.8 mmHg (ref 32.0–48.0)
pCO2 arterial: 47 mmHg (ref 32.0–48.0)
pCO2 arterial: 44.1 mmHg (ref 32.0–48.0)
pCO2 arterial: 45.8 mmHg (ref 32.0–48.0)
pH, Arterial: 7.373 (ref 7.350–7.450)
pH, Arterial: 7.323 — ABNORMAL LOW (ref 7.350–7.450)
pH, Arterial: 7.353 (ref 7.350–7.450)
pH, Arterial: 7.365 (ref 7.350–7.450)
pO2, Arterial: 345 mmHg — ABNORMAL HIGH (ref 83.0–108.0)
pO2, Arterial: 306 mmHg — ABNORMAL HIGH (ref 83.0–108.0)
pO2, Arterial: 268 mmHg — ABNORMAL HIGH (ref 83.0–108.0)
pO2, Arterial: 393 mmHg — ABNORMAL HIGH (ref 83.0–108.0)

## 2020-10-30 LAB — PROTIME-INR
INR: 1.4 — ABNORMAL HIGH (ref 0.8–1.2)
INR: 1.5 — ABNORMAL HIGH (ref 0.8–1.2)
Prothrombin Time: 16.8 seconds — ABNORMAL HIGH (ref 11.4–15.2)
Prothrombin Time: 17.8 seconds — ABNORMAL HIGH (ref 11.4–15.2)

## 2020-10-30 LAB — BASIC METABOLIC PANEL
Anion gap: 6 (ref 5–15)
Anion gap: 8 (ref 5–15)
BUN: 25 mg/dL — ABNORMAL HIGH (ref 8–23)
BUN: 31 mg/dL — ABNORMAL HIGH (ref 8–23)
CO2: 23 mmol/L (ref 22–32)
CO2: 22 mmol/L (ref 22–32)
Calcium: 6.8 mg/dL — ABNORMAL LOW (ref 8.9–10.3)
Calcium: 6.2 mg/dL — CL (ref 8.9–10.3)
Chloride: 113 mmol/L — ABNORMAL HIGH (ref 98–111)
Chloride: 113 mmol/L — ABNORMAL HIGH (ref 98–111)
Creatinine, Ser: 1.74 mg/dL — ABNORMAL HIGH (ref 0.44–1.00)
Creatinine, Ser: 1.97 mg/dL — ABNORMAL HIGH (ref 0.44–1.00)
GFR, Estimated: 30 mL/min — ABNORMAL LOW (ref >60)
GFR, Estimated: 26 mL/min — ABNORMAL LOW (ref >60)
Glucose, Bld: 175 mg/dL — ABNORMAL HIGH (ref 70–99)
Glucose, Bld: 184 mg/dL — ABNORMAL HIGH (ref 70–99)
Potassium: 4 mmol/L (ref 3.5–5.1)
Potassium: 4.5 mmol/L (ref 3.5–5.1)
Sodium: 142 mmol/L (ref 135–145)
Sodium: 143 mmol/L (ref 135–145)

## 2020-10-30 LAB — PREPARE PLATELET PHERESIS: Unit division: 0

## 2020-10-30 LAB — PREPARE RBC (CROSSMATCH)

## 2020-10-30 LAB — CBC
HCT: 27.7 % — ABNORMAL LOW (ref 36.0–46.0)
HCT: 27.7 % — ABNORMAL LOW (ref 36.0–46.0)
Hemoglobin: 9.1 g/dL — ABNORMAL LOW (ref 12.0–15.0)
Hemoglobin: 9.4 g/dL — ABNORMAL LOW (ref 12.0–15.0)
MCH: 28.7 pg (ref 26.0–34.0)
MCH: 30.2 pg (ref 26.0–34.0)
MCHC: 32.9 g/dL (ref 30.0–36.0)
MCHC: 33.9 g/dL (ref 30.0–36.0)
MCV: 87.4 fL (ref 80.0–100.0)
MCV: 89.1 fL (ref 80.0–100.0)
Platelets: 41 10*3/uL — ABNORMAL LOW (ref 150–400)
Platelets: 72 10*3/uL — ABNORMAL LOW (ref 150–400)
RBC: 3.17 MIL/uL — ABNORMAL LOW (ref 3.87–5.11)
RBC: 3.11 MIL/uL — ABNORMAL LOW (ref 3.87–5.11)
RDW: 17.5 % — ABNORMAL HIGH (ref 11.5–15.5)
RDW: 17 % — ABNORMAL HIGH (ref 11.5–15.5)
WBC: 5 10*3/uL (ref 4.0–10.5)
WBC: 5.5 10*3/uL (ref 4.0–10.5)
nRBC: 1.4 % — ABNORMAL HIGH (ref 0.0–0.2)
nRBC: 2 % — ABNORMAL HIGH (ref 0.0–0.2)

## 2020-10-30 LAB — APTT
aPTT: 44 seconds — ABNORMAL HIGH (ref 24–36)
aPTT: 44 seconds — ABNORMAL HIGH (ref 24–36)

## 2020-10-30 LAB — HEPARIN LEVEL (UNFRACTIONATED): Heparin Unfractionated: <0.1 IU/mL — ABNORMAL LOW (ref 0.30–0.70)

## 2020-10-30 LAB — FIBRINOGEN
Fibrinogen: 537 mg/dL — ABNORMAL HIGH (ref 210–475)
Fibrinogen: 466 mg/dL (ref 210–475)

## 2020-10-30 LAB — GLUCOSE, CAPILLARY
Glucose-Capillary: 158 mg/dL — ABNORMAL HIGH (ref 70–99)
Glucose-Capillary: 170 mg/dL — ABNORMAL HIGH (ref 70–99)
Glucose-Capillary: 171 mg/dL — ABNORMAL HIGH (ref 70–99)
Glucose-Capillary: 175 mg/dL — ABNORMAL HIGH (ref 70–99)
Glucose-Capillary: 176 mg/dL — ABNORMAL HIGH (ref 70–99)
Glucose-Capillary: 206 mg/dL — ABNORMAL HIGH (ref 70–99)

## 2020-10-30 LAB — HEPATIC FUNCTION PANEL
ALT: 33 U/L (ref 0–44)
AST: 113 U/L — ABNORMAL HIGH (ref 15–41)
Albumin: 2 g/dL — ABNORMAL LOW (ref 3.5–5.0)
Alkaline Phosphatase: 50 U/L (ref 38–126)
Bilirubin, Direct: 2.2 mg/dL — ABNORMAL HIGH (ref 0.0–0.2)
Indirect Bilirubin: 1.4 mg/dL — ABNORMAL HIGH (ref 0.3–0.9)
Total Bilirubin: 3.6 mg/dL — ABNORMAL HIGH (ref 0.3–1.2)
Total Protein: 4 g/dL — ABNORMAL LOW (ref 6.5–8.1)

## 2020-10-30 LAB — COOXEMETRY PANEL
Carboxyhemoglobin: 2.1 % — ABNORMAL HIGH (ref 0.5–1.5)
Methemoglobin: 1.3 % (ref 0.0–1.5)
O2 Saturation: 94.6 %
Total hemoglobin: 7.5 g/dL — ABNORMAL LOW (ref 12.0–16.0)

## 2020-10-30 LAB — MAGNESIUM: Magnesium: 1.8 mg/dL (ref 1.7–2.4)

## 2020-10-30 LAB — BPAM PLATELET PHERESIS
Blood Product Expiration Date: 202209242359
ISSUE DATE / TIME: 202209241036
Unit Type and Rh: 9500

## 2020-10-30 LAB — LACTATE DEHYDROGENASE: LDH: 442 U/L — ABNORMAL HIGH (ref 98–192)

## 2020-10-30 SURGERY — THROMBECTOMY, ARTERY, FEMORAL
Anesthesia: General | Site: Leg Lower | Laterality: Left

## 2020-10-30 MED ORDER — HEPARIN 6000 UNIT IRRIGATION SOLUTION
Status: DC | PRN
  Administered 2020-10-30: 1

## 2020-10-30 MED ORDER — MAGNESIUM SULFATE 2 GM/50ML IV SOLN
2.0000 g | Freq: Once | INTRAVENOUS | Status: AC
  Administered 2020-10-30: 2 g via INTRAVENOUS
  Filled 2020-10-30: qty 50

## 2020-10-30 MED ORDER — HEPARIN SODIUM (PORCINE) 1000 UNIT/ML IJ SOLN
INTRAMUSCULAR | Status: DC | PRN
  Administered 2020-10-30: 8000 [IU] via INTRAVENOUS

## 2020-10-30 MED ORDER — AMIODARONE LOAD VIA INFUSION
150.0000 mg | Freq: Once | INTRAVENOUS | Status: AC
  Administered 2020-10-30: 150 mg via INTRAVENOUS
  Filled 2020-10-30: qty 83.34

## 2020-10-30 MED ORDER — FLUCONAZOLE IN SODIUM CHLORIDE 200-0.9 MG/100ML-% IV SOLN
200.0000 mg | INTRAVENOUS | Status: DC
  Filled 2020-10-30: qty 100

## 2020-10-30 MED ORDER — LACTATED RINGERS IV SOLN
INTRAVENOUS | Status: DC | PRN
Start: 2020-10-30 — End: 2020-10-31

## 2020-10-30 MED ORDER — 0.9 % SODIUM CHLORIDE (POUR BTL) OPTIME
TOPICAL | Status: DC | PRN
  Administered 2020-10-30: 2000 mL

## 2020-10-30 MED ORDER — ROCURONIUM 10MG/ML (10ML) SYRINGE FOR MEDFUSION PUMP - OPTIME
INTRAVENOUS | Status: DC | PRN
  Administered 2020-10-30: 50 mg via INTRAVENOUS

## 2020-10-30 MED ORDER — HEMOSTATIC AGENTS (NO CHARGE) OPTIME
TOPICAL | Status: DC | PRN
  Administered 2020-10-30: 1 via TOPICAL

## 2020-10-30 MED ORDER — CALCIUM GLUCONATE-NACL 2-0.675 GM/100ML-% IV SOLN
2.0000 g | Freq: Once | INTRAVENOUS | Status: AC
  Administered 2020-10-30: 2000 mg via INTRAVENOUS
  Filled 2020-10-30: qty 100

## 2020-10-30 MED ORDER — HEPARIN (PORCINE) 25000 UT/250ML-% IV SOLN
300.0000 [IU]/h | INTRAVENOUS | Status: DC
  Administered 2020-10-30: 200 [IU]/h via INTRAVENOUS
  Filled 2020-10-30: qty 250

## 2020-10-30 MED ORDER — SODIUM CHLORIDE (PF) 0.9 % IJ SOLN
INTRAVENOUS | Status: DC | PRN
  Administered 2020-10-30: 55 mL via INTRAMUSCULAR

## 2020-10-30 MED ORDER — SODIUM CHLORIDE 0.9% IV SOLUTION: Freq: Once | INTRAVENOUS | Status: AC

## 2020-10-30 MED ORDER — SODIUM CHLORIDE 0.9 % IV SOLN
1.0000 g | Freq: Two times a day (BID) | INTRAVENOUS | Status: DC
  Administered 2020-10-30 – 2020-11-01 (×4): 1 g via INTRAVENOUS
  Filled 2020-10-30 (×6): qty 1

## 2020-10-30 MED ORDER — FLUCONAZOLE IN SODIUM CHLORIDE 400-0.9 MG/200ML-% IV SOLN
400.0000 mg | Freq: Once | INTRAVENOUS | Status: AC
  Administered 2020-10-30: 400 mg via INTRAVENOUS
  Filled 2020-10-30: qty 200

## 2020-10-30 MED ORDER — HEPARIN 6000 UNIT IRRIGATION SOLUTION
Status: AC
  Filled 2020-10-30: qty 500

## 2020-10-30 MED ORDER — ALBUMIN HUMAN 5 % IV SOLN
12.5000 g | Freq: Once | INTRAVENOUS | Status: AC
  Administered 2020-10-30: 12.5 g via INTRAVENOUS

## 2020-10-30 SURGICAL SUPPLY — 81 items
ADH SKN CLS APL DERMABOND .7 (GAUZE/BANDAGES/DRESSINGS) ×1
BAG COUNTER SPONGE SURGICOUNT (BAG) ×2 IMPLANT
BAG SPNG CNTER NS LX DISP (BAG) ×1
BANDAGE ESMARK 6X9 LF (GAUZE/BANDAGES/DRESSINGS) IMPLANT
BNDG CMPR 9X6 STRL LF SNTH (GAUZE/BANDAGES/DRESSINGS)
BNDG ELASTIC 4X5.8 VLCR STR LF (GAUZE/BANDAGES/DRESSINGS) IMPLANT
BNDG ESMARK 6X9 LF (GAUZE/BANDAGES/DRESSINGS)
CANISTER SUCT 3000ML PPV (MISCELLANEOUS) ×2 IMPLANT
CANNULA VESSEL 3MM 2 BLNT TIP (CANNULA) IMPLANT
CATH BEACON 5 .035 65 KMP TIP (CATHETERS) ×2 IMPLANT
CATH EMB 3FR 80CM (CATHETERS) IMPLANT
CATH EMB 4FR 80CM (CATHETERS) IMPLANT
CATH EMB 5FR 80CM (CATHETERS) IMPLANT
CATH OMNI FLUSH 5F 65CM (CATHETERS) ×2 IMPLANT
CLIP LIGATING EXTRA MED SLVR (CLIP) ×2 IMPLANT
CLIP LIGATING EXTRA SM BLUE (MISCELLANEOUS) ×2 IMPLANT
CLIP VESOCCLUDE SM WIDE 24/CT (CLIP) ×2 IMPLANT
COVER PROBE W GEL 5X96 (DRAPES) ×2 IMPLANT
CUFF TOURN SGL QUICK 24 (TOURNIQUET CUFF)
CUFF TOURN SGL QUICK 34 (TOURNIQUET CUFF)
CUFF TOURN SGL QUICK 42 (TOURNIQUET CUFF) IMPLANT
CUFF TRNQT CYL 24X4X16.5-23 (TOURNIQUET CUFF) IMPLANT
CUFF TRNQT CYL 34X4.125X (TOURNIQUET CUFF) IMPLANT
DERMABOND ADVANCED (GAUZE/BANDAGES/DRESSINGS) ×1
DERMABOND ADVANCED .7 DNX12 (GAUZE/BANDAGES/DRESSINGS) ×1 IMPLANT
DRAIN CHANNEL 15F RND FF W/TCR (WOUND CARE) IMPLANT
DRAPE HALF SHEET 40X57 (DRAPES) IMPLANT
DRAPE X-RAY CASS 24X20 (DRAPES) IMPLANT
DRSG COVADERM 4X8 (GAUZE/BANDAGES/DRESSINGS) ×2 IMPLANT
ELECT REM PT RETURN 9FT ADLT (ELECTROSURGICAL) ×2
ELECTRODE REM PT RTRN 9FT ADLT (ELECTROSURGICAL) ×1 IMPLANT
EVACUATOR SILICONE 100CC (DRAIN) IMPLANT
GAUZE 4X4 16PLY ~~LOC~~+RFID DBL (SPONGE) ×2 IMPLANT
GLOVE SURG ENC MOIS LTX SZ7.5 (GLOVE) ×2 IMPLANT
GLOVE SURG PR MICRO ENCORE 7.5 (GLOVE) ×2 IMPLANT
GOWN STRL REUS W/ TWL LRG LVL3 (GOWN DISPOSABLE) ×2 IMPLANT
GOWN STRL REUS W/ TWL XL LVL3 (GOWN DISPOSABLE) ×1 IMPLANT
GOWN STRL REUS W/TWL LRG LVL3 (GOWN DISPOSABLE) ×4
GOWN STRL REUS W/TWL XL LVL3 (GOWN DISPOSABLE) ×2
GUIDEWIRE BENTSON (WIRE) ×2 IMPLANT
HEMOSTAT SNOW SURGICEL 2X4 (HEMOSTASIS) ×4 IMPLANT
INSERT FOGARTY SM (MISCELLANEOUS) IMPLANT
KIT BASIN OR (CUSTOM PROCEDURE TRAY) ×2 IMPLANT
KIT MICROPUNCTURE NIT STIFF (SHEATH) ×4 IMPLANT
KIT TURNOVER KIT B (KITS) ×2 IMPLANT
MARKER GRAFT CORONARY BYPASS (MISCELLANEOUS) IMPLANT
NS IRRIG 1000ML POUR BTL (IV SOLUTION) ×4 IMPLANT
PACK PERIPHERAL VASCULAR (CUSTOM PROCEDURE TRAY) ×2 IMPLANT
PAD ARMBOARD 7.5X6 YLW CONV (MISCELLANEOUS) ×4 IMPLANT
PATCH VASC XENOSURE 1CMX6CM (Vascular Products) ×2 IMPLANT
PATCH VASC XENOSURE 1X6 (Vascular Products) ×1 IMPLANT
POWDER SURGICEL 3.0 GRAM (HEMOSTASIS) ×2 IMPLANT
SET COLLECT BLD 21X3/4 12 (NEEDLE) IMPLANT
SHEATH PINNACLE 5F 10CM (SHEATH) ×2 IMPLANT
SPONGE T-LAP 18X18 ~~LOC~~+RFID (SPONGE) ×4 IMPLANT
STAPLER VISISTAT 35W (STAPLE) ×2 IMPLANT
STOPCOCK 4 WAY LG BORE MALE ST (IV SETS) IMPLANT
STOPCOCK MORSE 400PSI 3WAY (MISCELLANEOUS) ×2 IMPLANT
SUT ETHILON 3 0 PS 1 (SUTURE) IMPLANT
SUT GORETEX 6.0 TT13 (SUTURE) IMPLANT
SUT GORETEX 6.0 TT9 (SUTURE) IMPLANT
SUT MNCRL AB 4-0 PS2 18 (SUTURE) ×6 IMPLANT
SUT PROLENE 5 0 C 1 24 (SUTURE) ×6 IMPLANT
SUT PROLENE 6 0 BV (SUTURE) ×8 IMPLANT
SUT PROLENE 7 0 BV 1 (SUTURE) IMPLANT
SUT SILK 2 0 SH (SUTURE) ×2 IMPLANT
SUT SILK 3 0 (SUTURE)
SUT SILK 3-0 18XBRD TIE 12 (SUTURE) IMPLANT
SUT VIC AB 2-0 CT1 27 (SUTURE) ×6
SUT VIC AB 2-0 CT1 TAPERPNT 27 (SUTURE) ×3 IMPLANT
SUT VIC AB 3-0 SH 27 (SUTURE) ×6
SUT VIC AB 3-0 SH 27X BRD (SUTURE) ×3 IMPLANT
SYR 10ML LL (SYRINGE) ×6 IMPLANT
TAPE UMBILICAL COTTON 1/8X30 (MISCELLANEOUS) IMPLANT
TOWEL GREEN STERILE (TOWEL DISPOSABLE) ×2 IMPLANT
TRAY FOLEY MTR SLVR 16FR STAT (SET/KITS/TRAYS/PACK) IMPLANT
TUBING CIL FLEX 10 FLL-RA (TUBING) ×2 IMPLANT
TUBING EXTENTION W/L.L. (IV SETS) IMPLANT
UNDERPAD 30X36 HEAVY ABSORB (UNDERPADS AND DIAPERS) ×2 IMPLANT
WATER STERILE IRR 1000ML POUR (IV SOLUTION) ×2 IMPLANT
WIRE TORQFLEX AUST .018X40CM (WIRE) ×2 IMPLANT

## 2020-10-30 NOTE — Progress Notes (Signed)
Pharmacy Antibiotic Note  Veronica Swanson is a 75 y.o. female admitted on 10/13/2020.  Pharmacy has been consulted for vancomycin/cefuroxime dosing for open chest and on ECMO. Vancomycin 1250mg  x1 given prior to surgery. Patient on levofloxacin for PCN allergy documented as dizziness and one time doses of cefazolin given x2   Vancomycin 1gm q24h, and cefuroxime  Cr slightly increased 1.3>1.7 after furosemide administration 9/23 and increase pressor requirements WBC wnl and afebrile but with open chest Will broaden antibiotics   Plan: Continue Vancomycin  1gm IV q24hr  Stop cefuroxime Fluconazole 400mg  x1 then 200mg  q24h Meropenem 1gm IV q12h Monitor renal function, cultures, and clinical progression  Height: 5\' 7"  (170.2 cm) Weight: 84.1 kg (185 lb 6.5 oz) IBW/kg (Calculated) : 61.6  Temp (24hrs), Avg:97.9 F (36.6 C), Min:97.7 F (36.5 C), Max:98.1 F (36.7 C)  Recent Labs  Lab 10/08/2020 1958 10/12/2020 2328 10/26/20 0409 10/26/20 0803 11/02/2020 0730 10/08/2020 0752 10/08/2020 1104 10/28/2020 1624 10/28/20 1556 10/28/20 2219 10/28/20 2331 10/29/20 0427 10/29/20 0701 10/29/20 1508 10/27/2020 0403  WBC 3.9*   < > 2.1*   < >  --   --   --    < > 5.7 6.1 5.6 4.6  --  5.0 5.0  CREATININE 1.40*   < > 1.39*   < >  --   --   --    < > 1.53*  --  1.60* 1.53*  --  1.54* 1.74*  LATICACIDVEN 10.4*  --  6.2*  --   --  1.8 1.6  --  1.0  --   --   --   --   --   --   VANCOTROUGH  --   --   --   --  11*  --   --   --   --   --   --   --  26*  --   --    < > = values in this interval not displayed.     Estimated Creatinine Clearance: 30.7 mL/min (A) (by C-G formula based on SCr of 1.74 mg/dL (H)).    Allergies  Allergen Reactions   Penicillins Other (See Comments)    Dizzy     Antimicrobials this admission: Vancomycin 9/20 >>  Cefuroxime 9/20 >>9/25 Meropenem 9/25 Fluconazole 9/25  Dose adjustments this admission: VT 11 (10/10/2020)- changed vancomycin to 750 mg q12h VT 26 (9/24)  vancomycin 750mg  IV q12H  Microbiology results:   10/25 Pharm.D. CPP, BCPS Clinical Pharmacist [phone] 10/17/2020 3:14 PM   Please check [email or web address] for unit-specific pharmacy phone numbers.

## 2020-10-30 NOTE — Progress Notes (Signed)
ECMO PROGRESS NOTE  NAME:  Veronica Swanson, MRN:  [id number], DOB:  1946/01/16, LOS: 9 ADMISSION DATE:  10/29/2020, CONSULTATION DATE:  10/10/2020 REFERRING MD:  Roxan Hockey, CHIEF COMPLAINT:  VA-ECMO   HPI/course in hospital  75 year old woman who was placed on VA-ECMO support following CABGx4.  She presented 4 days ago with CCS class 4 angina and ruled in for NSTEMI. Echo showed EF of 35% with mild mitral regurgitation and an anterior wall regional wall motion abnormalities.  Coronary catheterization revealed triple-vessel disease.  LVEDP was normal at that time.  She was scheduled for urgent CABG.  This morning she began experiencing retrosternal chest pain around the time of arrival in the operating room.  She received a standard cardiac conduction with no hypotension.  However, she became progressively hypotensive and suffered a cardiac arrest likely due to a further ischemic event.  She was urgently cannulated for cardiopulmonary bypass.  She developed severe pulmonary edema and was very difficult to oxygenate.  She underwent SL bypass with reasonable targets.  She could not separate from bypass due to pulmonary edema and poor LV function.  She was converted to [facility] ECMO and a 5 5 Impella was placed and she was transferred to the ICU.  Interim history/subjective:  Partial washout yesterday. Bowel movement, tolerating feeds. Continues to ooze from cannula site   Objective   Blood pressure (!) 88/66, pulse (!) 102, temperature 98.1 F (36.7 C), resp. rate 16, height _0  (1.702 m), weight 84.1 kg, SpO2 100 %. PAP: (11-25)/(6-14) 18/14 CVP:  [2 mmHg-9 mmHg] 2 mmHg  Vent Mode: PCV FiO2 (%):  [40 %] 40 % Set Rate:  [16 bmp] 16 bmp PEEP:  [5 cmH20] 5 cmH20 Plateau Pressure:  [14 cmH20-15 cmH20] 14 cmH20   Intake/Output Summary (Last 24 hours) at 10/16/2020 0859 Last data filed at 10/23/2020 0800 Gross per 24 hour  Intake 4714.13 ml  Output 2945 ml  Net 1769.13 ml     Filed Weights   10/28/20 0500 10/29/20 0500 10/06/2020 0630  Weight: 85.7 kg 83.8 kg 84.1 kg    Examination: Physical Exam Constitutional:      Interventions: She is sedated and intubated.  HENT:     Head:     Comments: OGT and ETT in place.  Eyes:     Conjunctiva/sclera: Conjunctivae normal.     Right eye: Chemosis present.     Left eye: Chemosis present.  Cardiovascular:     Rate and Rhythm: Rhythm irregular.     Chest Wall: PMI is displaced (Apex beat now palpable).     Pulses: Intact distal pulses.     Heart sounds: Heart sounds are distant.    No friction rub.     Comments: Generalized edema.  Increased pulsatility. Pulmonary:     Effort: She is intubated.     Breath sounds: Examination of the left-upper field reveals decreased breath sounds. Examination of the left-middle field reveals decreased breath sounds. Examination of the left-lower field reveals decreased breath sounds. Decreased breath sounds present.     Comments: Vt 70 - increased to 190 with PS 15 and PEEP to 8 Chest:     Comments: Open chest. Sero-sanguinous drainage. Abdominal:     General: Abdomen is flat and scaphoid. Bowel sounds are decreased.     Palpations: Abdomen is soft.     Tenderness: There is no abdominal tenderness.  Genitourinary:    Comments: Foley in  place with clear urine.  Skin:    General: Skin is warm.     Comments: Line sites are intact.  Neurological:     Mental Status: She is unresponsive.     Assessment & Plan:  Critically ill due to cardiogenic and vasoplegic shock following CABG complicated by perioperative cardiac arrest requiring inotropic, vasopressor mechanical cardiac support and VA ECMO  Ischemic cardiomyopathy due to recent acute non-ST elevation coronary syndrome Critically ill due to acute hypoxic respiratory failure requiring mechanical ventilation and ECMO support Acute kidney injury - improving Acute blood loss anemia Coagulopathy secondary to  cardiopulmonary bypass  - Continue vent support, VAP prevention bundle, sedation titrated to RASS -4 to -5 - Adjust sweep for pH 7.35-7.45 - Continues chest tube output. At this point seems to be more blood tinged serous discharge.   - Plan for OR washout and TEE to reassess bleeding and any recovery of cardiac function.  - Have increased ventilator settings to recruit lung and provide some external tamponading of pericardium  - Plan to start low-dose anticoagulation tomorrow if drainage decreases.    - Impella, inotrope and ECMO pump support to continue at current settings, still severely stunned heart on echo.  - Levophed/vasopressin titrating to MAP 65 for concurrent vasoplegia from ECMO circuit and arrest - significantly improved.    Daily Goals Checklist  Pain/Anxiety/Delirium protocol (if indicated): Dilaudid and Versed infusions - decrease while keeping RASS -4 VAP protocol (if indicated): Bundle in place Respiratory support goals: ECMO rest setting, keep plateau pressure less than 25, PC 15/8 Blood pressure target: MAP 70-80 DVT prophylaxis: Plan to start systemic [facility] tomorrow ? Nutritional status and feeding goals: Full dose tube feeds  GI prophylaxis: PPI Fluid status goals: allow autoregulation.  Urinary catheter: Guide hemodynamic management Central lines: Right IJ MAC introducer, left radial art line Glucose control: Insulin Eagle  Mobility/therapy needs: Bedrest Antibiotic de-escalation: Vancomycin and cefuroxime for open chest Home medication reconciliation: On hold Daily labs: Per ECMO protocol Code Status: Full code Family Communication: Updated by Dr Tamala Julian 9/21 Disposition: ICU  CRITICAL CARE Performed by: Kipp Brood  Total critical care time: 45 minutes  Critical care time was exclusive of separately billable procedures and treating other patients.  Critical care was necessary to treat or prevent imminent or life-threatening deterioration.  Critical care was  time spent personally by me on the following activities: development of treatment plan with patient and/or surrogate as well as nursing, discussions with consultants, evaluation of patient's response to treatment, examination of patient, obtaining history from patient or surrogate, ordering and performing treatments and interventions, ordering and review of laboratory studies, ordering and review of radiographic studies, pulse oximetry, re-evaluation of patient's condition and participation in multidisciplinary rounds.  Kipp Brood, MD [facility] ICU Physician [place]  Pager: [phone] Mobile: [phone] After hours: [phone].

## 2020-10-30 NOTE — Progress Notes (Signed)
[place] for heparin Indication:  Impella 5.5 + ECMO VA  Allergies  Allergen Reactions   Penicillins Other (See Comments)    Dizzy     Patient Measurements: Height: '5\' 7"'  (170.2 cm) Weight: 84.1 kg (185 lb 6.5 oz) IBW/kg (Calculated) : 61.6 Heparin Dosing Weight: 78.7 kg   Vital Signs: Temp: 97.9 F (36.6 C) (09/25 0915) Temp Source: Core (09/25 0800) BP: 88/66 (09/25 0312) Pulse Rate: 77 (09/25 0915)  Labs: Recent Labs    10/28/20 0434 10/28/20 0436 10/29/20 0427 10/29/20 0429 10/29/20 1508 10/29/20 1511 10/29/20 1938 11/04/2020 0403 10/26/2020 0436  HGB 8.2*   < > 8.4*   < > 7.9*   < > 7.8* 9.1* 8.2*  HCT 24.0*   < > 25.1*   < > 23.5*   < > 23.0* 27.7* 24.0*  PLT 45*   < > 32*  --  60*  --   --  41*  --   APTT  --    < > 50*  --  46*  --   --  44*  --   LABPROT  --    < > 18.7*  --  18.1*  --   --  16.8*  --   INR  --    < > 1.6*  --  1.5*  --   --  1.4*  --   HEPARINUNFRC <0.10*  --  <0.10*  --   --   --   --  <0.10*  --   CREATININE  --    < > 1.53*  --  1.54*  --   --  1.74*  --    < > = values in this interval not displayed.     Estimated Creatinine Clearance: 30.7 mL/min (A) (by C-G formula based on SCr of 1.74 mg/dL (H)).   Medical History: Past Medical History:  Diagnosis Date   Carotid artery occlusion    Coronary artery disease    Hyperlipidemia    Hypertension    Myocardial infarction ([place]) 1997    Medications:  Scheduled:   aspirin EC  325 mg Oral Daily   Or   aspirin  324 mg Per Tube Daily   bisacodyl  10 mg Oral Daily   Or   bisacodyl  10 mg Rectal Daily   chlorhexidine gluconate (MEDLINE KIT)  15 mL Mouth Rinse BID   Chlorhexidine Gluconate Cloth  6 each Topical Daily   docusate  100 mg Per Tube BID   insulin aspart  0-24 Units Subcutaneous Q4H   mouth rinse  15 mL Mouth Rinse 10 times per day   pantoprazole (PROTONIX) IV  40 mg Intravenous Q24H   polyethylene glycol  17 g Per Tube BID    rosuvastatin  40 mg Oral Daily   sennosides  10 mL Oral BID   sodium chloride flush  10-40 mL Intracatheter Q12H   sodium chloride flush  3 mL Intravenous Q12H    Assessment: 30 yof who underwent CABG x4 complicated cardiac arrest - now has impella 5.5 and on VA-ECMO. No AC PTA.  VA-ECMO + Impella 5.5 at P-3   purge flow 7-7.5 mL/hr (350-375 units/hr of heparin from purge solution). Purge pressure stable 500s HL < 0.1 aptt 44sec Still oozing from open chest, h/h 8-9 3 uts PRBC last 48hr -improvement from last week,  PLTC low 30-40s s/p 1unit platelets 9/24, fibrinogen increasing 500s LDH increasing 400  - monitor    ECMO  circuit assessed for clots, none noted.   Goal of Therapy:  Heparin level <0.3 units/ml Monitor platelets by anticoagulation protocol: Yes   Plan:  Continue heparin purge solution (50 units/mL) with Impella No systemic heparin at this time - re-evaluate daily Monitor s/sx of bleeding - monitor heparin level with AM labs    Bonnita Nasuti Pharm.D. CPP, BCPS Clinical Pharmacist [phone] 10/17/2020 9:34 AM   Please check [email or web address] for unit-specific pharmacy phone numbers.

## 2020-10-30 NOTE — Consult Note (Addendum)
Hospital Consult    Reason for Consult: Loss of signals left lower extremity Referring Physician: Dr. Gala Romney MRN #:  [id number]  History of Present Illness: This is a 75 y.o. female history of VA ECMO status post CABG x2.  Cardiology and critical care are following closely with plan for washout of her chest with cardiothoracic surgery tomorrow.  Today she was noted to have loss of signals in her left foot prompting consultation.  Per report she did have signals in the both legs at the dorsalis pedis earlier in the day.  Past Medical History:  Diagnosis Date   Carotid artery occlusion    Coronary artery disease    Hyperlipidemia    Hypertension    Myocardial infarction [facility]) 1997    Past Surgical History:  Procedure Laterality Date   ABDOMINAL HYSTERECTOMY     BEDSIDE EVACUATION OF HEMATOMA N/A 10/24/2020   Procedure: BEDSIDE EXPLORATION OF POST-OP PATIENT;  Surgeon: [name], MD;  Location: [facility] OR;  Service: Vascular;  Laterality: N/A;   CANNULATION FOR ECMO (EXTRACORPOREAL MEMBRANE OXYGENATION)  10/24/2020   Procedure: CANNULATION FOR ECMO (EXTRACORPOREAL MEMBRANE OXYGENATION);  Surgeon: [name], MD;  Location: [facility] OR;  Service: Open Heart Surgery;;   CARDIAC CATHETERIZATION     CORONARY ARTERY BYPASS GRAFT N/A 10/19/2020   Procedure: CORONARY ARTERY BYPASS GRAFTING (CABG) TIMES FOUR, ON PUMP, USING LEFT INTERNAL MAMMARY ARTERY AND ENDOSCOPICALLY HARVESTED RIGHT GREATER SAPHENOUS VEIN;  Surgeon: [name], MD;  Location: [facility] OR;  Service: Open Heart Surgery;  Laterality: N/A;   ENDOVEIN HARVEST OF GREATER SAPHENOUS VEIN Right 10/18/2020   Procedure: ENDOVEIN HARVEST OF GREATER SAPHENOUS VEIN;  Surgeon: [name], MD;  Location: [facility] OR;  Service: Open Heart Surgery;  Laterality: Right;   HEMATOMA EVACUATION N/A 10/28/2020   Procedure: BEDSIDE POST OPERATIVE CORONARY ARTERY BYPASS GRAFTING EXPLORATION;  Surgeon: [name], MD;   Location[facility] OR;  Service: Vascular;  Laterality: N/A;   LEFT HEART CATH AND CORONARY ANGIOGRAPHY N/A 10/24/2020   Procedure: LEFT HEART CATH AND CORONARY ANGIOGRAPHY;  Surgeon: Elder Negus, MD;  Location: [facility] INVASIVE CV LAB;  Service: Cardiovascular;  Laterality: N/A;   PLACEMENT OF IMPELLA LEFT VENTRICULAR ASSIST DEVICE  10/21/2020   Procedure: PLACEMENT OF IMPELLA LEFT VENTRICULAR ASSIST DEVICE;  Surgeon: [name], MD;  Location: [facility] OR;  Service: Open Heart Surgery;;   STERNAL WOUND DEBRIDEMENT N/A 10/26/2020   Procedure: Horace Porteous OF STERNAL HEMATOMA;  Surgeon: [name], MD;  Location: [facility] OR;  Service: Thoracic;  Laterality: N/A;   TEE WITHOUT CARDIOVERSION N/A 11/04/2020   Procedure: TRANSESOPHAGEAL ECHOCARDIOGRAM (TEE);  Surgeon: [name], MD;  Location: [facility] OR;  Service: Open Heart Surgery;  Laterality: N/A;    Allergies  Allergen Reactions   Penicillins Other (See Comments)    Dizzy     Prior to Admission medications   Medication Sig Start Date End Date Taking? Authorizing Provider  amLODipine (NORVASC) 5 MG tablet Take 5 mg by mouth every morning. 08/29/20  Yes [provider]  aspirin 81 MG EC tablet Take 162 mg by mouth every morning. Swallow whole.   Yes [provider]  latanoprost (XALATAN) 0.005 % ophthalmic solution Place 1 drop into both eyes at bedtime. 01/15/20  Yes [provider]  losartan-hydrochlorothiazide (HYZAAR) 50-12.5 MG tablet TAKE 1 TABLET BY MOUTH EVERY DAY Patient taking differently: Take 1 tablet by mouth every morning. 04/06/19  Yes Toniann Fail, NP  Multiple  Vitamin (MULTIVITAMIN WITH MINERALS) TABS tablet Take 1 tablet by mouth every morning. Centrum Silver   Yes [provider]  rosuvastatin (CRESTOR) 20 MG tablet TAKE 1 TABLET BY MOUTH EVERY DAY Patient taking differently: Take 20 mg by mouth every morning. 04/02/19  Yes Yates Decamp, MD    Social History   Socioeconomic  History   Marital status: Single    Spouse name: Not on file   Number of children: Not on file   Years of education: Not on file   Highest education level: Not on file  Occupational History   Not on file  Tobacco Use   Smoking status: Some Days    Types: Cigarettes   Smokeless tobacco: Never   Tobacco comments:    2 per day   Substance and Sexual Activity   Alcohol use: Never   Drug use: Never   Sexual activity: Not on file  Other Topics Concern   Not on file  Social History Narrative   Not on file   Social Determinants of Health   Financial Resource Strain: Not on file  Food Insecurity: Not on file  Transportation Needs: Not on file  Physical Activity: Not on file  Stress: Not on file  Social Connections: Not on file  Intimate Partner Violence: Not on file    Family History  Problem Relation Age of Onset   Heart disease Mother    Hypertension Father    Heart disease Father    Thyroid disease Neg Hx      Review of systems could not be obtained secondary to patient being intubated sedated   Physical Examination  Vitals:   10/06/2020 1115 10/20/2020 1130  BP:    Pulse: (!) 30 (!) 45  Resp: (!) 8 (!) 8  Temp: 97.9 F (36.6 C) 97.9 F (36.6 C)  SpO2: 100% 100%   Body mass index is 29.04 kg/m.  Intubated sedated Chest VAC dressing in place Both legs do appear to have dusky discoloration of the toes there is no difference in appearance of the proximal feet or ankles Left leg slightly cooler than right There are monophasic venous sounding signals right anterior tibial and peroneal arteries Left ankle no signals discernible AT, PT, peroneal  CBC    Component Value Date/Time   WBC 5.0 01-Nov-2020 0403   RBC 3.17 (L) 10/15/2020 0403   HGB 8.2 (L) November 01, 2020 0436   HGB 11.2 12/09/2017 1142   HCT 24.0 (L) 10/10/2020 0436   HCT 33.7 (L) 12/09/2017 1142   PLT 41 (L) 10/09/2020 0403   PLT 319 12/09/2017 1142   MCV 87.4 11/04/2020 0403   MCV 82 12/09/2017  1142   MCH 28.7 10/09/2020 0403   MCHC 32.9 Nov 01, 2020 0403   RDW 17.5 (H) 10/19/2020 0403   RDW 14.1 12/09/2017 1142   LYMPHSABS 0.4 (L) 10/26/2020 2328   LYMPHSABS 2.5 12/09/2017 1142   MONOABS 0.2 10/14/2020 2328   EOSABS 0.0 10/24/2020 2328   EOSABS 0.1 12/09/2017 1142   BASOSABS 0.0 10/12/2020 2328   BASOSABS 0.0 12/09/2017 1142    BMET    Component Value Date/Time   NA 145 10/11/2020 0436   NA 141 12/07/2019 1316   K 4.1 Nov 01, 2020 0436   CL 113 (H) 01-Nov-2020 0403   CO2 23 10/16/2020 0403   GLUCOSE 175 (H) 01-Nov-2020 0403   BUN 25 (H) 10/14/2020 0403   BUN 20 12/07/2019 1316   CREATININE 1.74 (H) 10/12/2020 0403   CALCIUM 6.8 (L) 10/06/2020  0403   GFRNONAA 30 (L) 10/09/2020 0403   GFRAA 48 (L) 12/07/2019 1316    COAGS: Lab Results  Component Value Date   INR 1.4 (H) 10/19/2020   INR 1.5 (H) 10/29/2020   INR 1.6 (H) 10/29/2020     Non-Invasive Vascular Imaging:   Left lower extremity arterial duplex ordered   ASSESSMENT/PLAN: 75 y.o. female intubated and sedated on VA ECMO with loss of signals left lower extremity.  Left foot does not grossly appear more ischemic than right but there are no discernible signals.  I have ordered arterial duplex of left lower extremity arterial tree for evaluation.  I have discussed the case with Drs. Lightfoot, Bensimhon and Agarwala and unfortunately if patient does have acute limb ischemia of the left lower extremity it may not be a survivable situation.  Vascular surgery will be available as needed.  Corey Laski C. Randie Heinz, MD [facility] Office: [phone] Pager: [phone]   Addendum:  Left lower extremity duplex reviewed with absent blood flow distally. She has been started on heparin per CT surgery. I discussed this finding as well as her probable grim prognosis with her sister Consepcion Hearing. After discussion with her other family they desire for all interventional therapies at this time. Will plan  for left lower extremity thrombectomy, angiogram and probable 4 compartment fasciotomy tonight in OR.   Jefrey Raburn C. Randie Heinz, MD

## 2020-10-30 NOTE — Progress Notes (Signed)
EVENING ROUNDS NOTE :     [address]       [place] [address]             [phone]                 3 Days Post-Op Procedure(s) (LRB): DRAIN OF STERNAL HEMATOMA (N/A)   Total Length of Stay:  LOS: 9 days  Events:   Vascular consulted for loss of signals in LLE NTD at this point No changes in mechanical support    BP 103/77   Pulse (!) 43   Temp 98.1 F (36.7 C)   Resp (!) 9   Ht 5\' 7"  (1.702 m)   Wt 84.1 kg   SpO2 100%   BMI 29.04 kg/m   PAP: (11-25)/(7-15) 16/14 CVP:  [2 mmHg] 2 mmHg  Vent Mode: PCV FiO2 (%):  [40 %] 40 % Set Rate:  [16 bmp] 16 bmp PEEP:  [5 cmH20-8 cmH20] 8 cmH20 Plateau Pressure:  [14 cmH20-22 cmH20] 22 cmH20   sodium chloride     sodium chloride Stopped (10/29/20 0611)   sodium chloride 10 mL/hr at 10/31/2020 1700   amiodarone 60 mg/hr (10/29/2020 1700)   dexmedetomidine (PRECEDEX) IV infusion Stopped (10/28/20 0748)   epinephrine 6 mcg/min (10/20/2020 1700)   feeding supplement (PIVOT 1.5 CAL) 1,000 mL (10/23/2020 1454)   [START ON 2020-11-26] fluconazole (DIFLUCAN) IV     heparin 50 units/mL (Impella PURGE) in dextrose 5 % 1000 mL bag     HYDROmorphone 4 mg/hr (10/26/2020 1700)   lactated ringers Stopped (10/29/20 0942)   meropenem (MERREM) IV Stopped (10/26/2020 1513)   midazolam 6.5 mg/hr (10/23/2020 1700)   milrinone Stopped (11/02/2020 1318)   norepinephrine (LEVOPHED) Adult infusion 20 mcg/min (10/23/2020 1700)   vancomycin Stopped (10/29/2020 0958)   vasopressin 0.04 Units/min (10/18/2020 1700)    I/O last 3 completed shifts: In: 7841.7 [I.V.:3639.6; 10/31/2020; Other:347.6; NG/GT:995.2; IV Piggyback:1314.3] Out: 5090 [Urine:4575; Drains:25; Chest Tube:490]   CBC Latest Ref Rng & Units 10/18/2020 10/08/2020 10/31/2020  WBC 4.0 - 10.5 K/uL - - -  Hemoglobin 12.0 - 15.0 g/dL 7.8(L) 7.1(L) 8.2(L)  Hematocrit 36.0 - 46.0 % 23.0(L) 21.0(L) 24.0(L)  Platelets 150 - 400 K/uL - - -    BMP Latest Ref Rng & Units 10/14/2020  10/23/2020 10/20/2020  Glucose 70 - 99 mg/dL - - -  BUN 8 - 23 mg/dL - - -  Creatinine 10/29/2020 - 1.00 mg/dL - - -  BUN/Creat Ratio 12 - 28 - - -  Sodium 135 - 145 mmol/L 144 144 145  Potassium 3.5 - 5.1 mmol/L 4.1 3.8 4.1  Chloride 98 - 111 mmol/L - - -  CO2 22 - 32 mmol/L - - -  Calcium 8.9 - 10.3 mg/dL - - -    ABG    Component Value Date/Time   PHART 7.353 10/06/2020 1447   PCO2ART 44.1 10/27/2020 1447   PO2ART 268 (H) 10/09/2020 1447   HCO3 24.6 10/18/2020 1447   TCO2 26 10/16/2020 1447   ACIDBASEDEF 1.0 10/27/2020 1447   O2SAT 100.0 10/11/2020 1447       10/07/2020, MD 10/17/2020 5:42 PM

## 2020-10-30 NOTE — Progress Notes (Signed)
Patient transported on ECMO from 2H22 to OR 16 on an oxygen tank and battery power for Femoral Artery Thrombectomy. Emergency supplies were brought with the circuit. Upon arrival to the operating room, the oxygen was connected to the wall source and the circuit plugged into a red power outlet. The transport was uneventful.

## 2020-10-30 NOTE — Progress Notes (Addendum)
ECMO PROGRESS NOTE  NAME:  Veronica Swanson, MRN:  [id number], DOB:  01-04-1946, LOS: 9 ADMISSION DATE:  10/26/2020, CONSULTATION DATE:  10/20/2020 REFERRING MD:  Roxan Hockey, CHIEF COMPLAINT:  VA-ECMO   HPI/course in hospital  75 year old woman who was placed on VA-ECMO support following CABGx4.  She presented 4 days ago with CCS class 4 angina and ruled in for NSTEMI. Echo showed EF of 35% with mild mitral regurgitation and an anterior wall regional wall motion abnormalities.  Coronary catheterization revealed triple-vessel disease.  LVEDP was normal at that time.  She was scheduled for urgent CABG.  This morning she began experiencing retrosternal chest pain around the time of arrival in the operating room.  She received a standard cardiac conduction with no hypotension.  However, she became progressively hypotensive and suffered a cardiac arrest likely due to a further ischemic event.  She was urgently cannulated for cardiopulmonary bypass.  She developed severe pulmonary edema and was very difficult to oxygenate.  She underwent SL bypass with reasonable targets.  She could not separate from bypass due to pulmonary edema and poor LV function.  She was converted to [facility] ECMO and a 5 5 Impella was placed and she was transferred to the ICU.  Interim history/subjective:  Partial washout today. Did not tolerate attempt at diuresis yesterday and required return of volume.   Objective   Blood pressure (!) 88/66, pulse (!) 102, temperature 98.1 F (36.7 C), resp. rate 16, height _0  (1.702 m), weight 84.1 kg, SpO2 100 %. PAP: (11-25)/(6-14) 18/14 CVP:  [2 mmHg-9 mmHg] 2 mmHg  Vent Mode: PCV FiO2 (%):  [40 %] 40 % Set Rate:  [16 bmp] 16 bmp PEEP:  [5 cmH20] 5 cmH20 Plateau Pressure:  [14 cmH20-15 cmH20] 14 cmH20   Intake/Output Summary (Last 24 hours) at 10/26/2020 0855 Last data filed at 10/14/2020 0800 Gross per 24 hour  Intake 4714.13 ml  Output 2945 ml  Net 1769.13 ml     Filed Weights   10/28/20 0500 10/29/20 0500 10/29/2020 0630  Weight: 85.7 kg 83.8 kg 84.1 kg    Examination: Physical Exam Constitutional:      Interventions: She is sedated and intubated.  HENT:     Head:     Comments: OGT and ETT in place.  Eyes:     Conjunctiva/sclera: Conjunctivae normal.  Cardiovascular:     Rate and Rhythm: Rhythm irregular.     Pulses: Intact distal pulses.     Heart sounds: Heart sounds are distant.    No friction rub.     Comments: Generalized edema.  Pulmonary:     Effort: She is intubated.     Breath sounds: Normal breath sounds.  Chest:     Comments: Open chest. Sero-sanguinous drainage. Abdominal:     General: Abdomen is flat and scaphoid. Bowel sounds are decreased.     Palpations: Abdomen is soft.     Tenderness: There is no abdominal tenderness.  Genitourinary:    Comments: Foley in place with clear urine.  Skin:    General: Skin is warm.     Comments: Line sites are intact.  Neurological:     Mental Status: She is unresponsive.     Assessment & Plan:  Critically ill due to cardiogenic and vasoplegic shock following CABG complicated by perioperative cardiac arrest requiring inotropic, vasopressor mechanical cardiac support and VA ECMO  Ischemic cardiomyopathy due to recent acute non-ST elevation  coronary syndrome Critically ill due to acute hypoxic respiratory failure requiring mechanical ventilation and ECMO support Acute kidney injury - improving Acute blood loss anemia Coagulopathy secondary to cardiopulmonary bypass  - Continue vent support, VAP prevention bundle, sedation titrated to RASS -4 to -5 - Adjust sweep for pH 7.35-7.45 - Decreasing chest tube output. At this point seems to be more blood tinged serous discharge.   - Plan to start low-dose anticoagulation tomorrow if drainage decreases. .   - Impella, inotrope and ECMO pump support to continue at current settings, still severely stunned heart on echo.  -  Levophed/vasopressin titrating to MAP 65 for concurrent vasoplegia from ECMO circuit and arrest - significantly improved.    Daily Goals Checklist  Pain/Anxiety/Delirium protocol (if indicated): Dilaudid and Versed infusions VAP protocol (if indicated): Bundle in place Respiratory support goals: ECMO rest setting, keep plateau pressure less than 25, current (9/22) is PEEP 5, Plat 15 Blood pressure target: MAP 70-80 DVT prophylaxis: Plan to start systemic AC tomorrow.  Nutritional status and feeding goals: on hold for now GI prophylaxis: PPI Fluid status goals: initiate diuresis Urinary catheter: Guide hemodynamic management Central lines: Right IJ MAC introducer, left radial art line Glucose control: Insulin infusion by [employer] Mobility/therapy needs: Bedrest Antibiotic de-escalation: Vancomycin and cefuroxime for open chest Home medication reconciliation: On hold Daily labs: Per ECMO protocol Code Status: Full code Family Communication: Updated by Dr Tamala Julian 9/21 Disposition: ICU  CRITICAL CARE Performed by: Kipp Brood  Total critical care time: 45 minutes  Critical care time was exclusive of separately billable procedures and treating other patients.  Critical care was necessary to treat or prevent imminent or life-threatening deterioration.  Critical care was time spent personally by me on the following activities: development of treatment plan with patient and/or surrogate as well as nursing, discussions with consultants, evaluation of patient's response to treatment, examination of patient, obtaining history from patient or surrogate, ordering and performing treatments and interventions, ordering and review of laboratory studies, ordering and review of radiographic studies, pulse oximetry, re-evaluation of patient's condition and participation in multidisciplinary rounds.  Kipp Brood, MD [facility] ICU Physician [place]  Pager: [phone] Mobile:  [phone] After hours: [phone].

## 2020-10-30 NOTE — Progress Notes (Signed)
[address]       [place] [address]             [phone]           3 Days Post-Op Procedure(s) (LRB): DRAIN OF STERNAL HEMATOMA (N/A)   Received 4 units of blood, and 1 of plts overnight _______________________________________________________________ Vitals: BP (!) 88/66   Pulse 77   Temp 97.9 F (36.6 C)   Resp (!) 8   Ht 5\' 7"  (1.702 m)   Wt 84.1 kg   SpO2 100%   BMI 29.04 kg/m  Filed Weights   10/28/20 0500 10/29/20 0500 10/14/2020 0630  Weight: 85.7 kg 83.8 kg 84.1 kg     - Neuro: Sedated  - Cardiovascular: Hematoma noted along the left dressing.  Atrial fibrillation  Drips: Amiodarone, epinephrine at 6, milrinone 0.25, Levophed 10, vaso0.03 Impella at P3 ECMO flowing at 3.7 L. PAP: (11-25)/(6-14) 17/13 CVP:  [2 mmHg-9 mmHg] 2 mmHg  - Pulm:  Vent Mode: PCV FiO2 (%):  [40 %] 40 % Set Rate:  [16 bmp] 16 bmp PEEP:  [5 cmH20] 5 cmH20 Plateau Pressure:  [14 cmH20-15 cmH20] 14 cmH20  ABG    Component Value Date/Time   PHART 7.373 10/28/2020 0436   PCO2ART 45.8 10/17/2020 0436   PO2ART 345 (H) 10/28/2020 0436   HCO3 26.8 10/29/2020 0436   TCO2 28 10/29/2020 0436   ACIDBASEDEF 2.0 10/28/2020 2333   O2SAT 100.0 10/10/2020 0436     .Intake/Output      09/24 0701 09/25 0700 09/25 0701 09/26 0700   I.V. (mL/kg) 1883.5 (22.4) 345.9 (4.1)   Blood 980    Other 230 22.2   NG/GT 660.2 260   IV Piggyback 458.2 296.5   Total Intake(mL/kg) 4211.9 (50.1) 924.6 (11)   Urine (mL/kg/hr) 2780 (1.4) 175 (0.5)   Drains 25    Stool 0    Chest Tube 200 30   Total Output 3005 205   Net +1206.9 +719.6        Stool Occurrence 1 x       _______________________________________________________________ Labs: CBC Latest Ref Rng & Units 10/17/2020 11/03/2020 10/29/2020  WBC 4.0 - 10.5 K/uL - 5.0 -  Hemoglobin 12.0 - 15.0 g/dL 8.2(L) 9.1(L) 7.8(L)  Hematocrit 36.0 - 46.0 % 24.0(L) 27.7(L) 23.0(L)  Platelets 150 - 400 K/uL - 41(L) -   CMP  Latest Ref Rng & Units 10/23/2020 11/04/2020 10/29/2020  Glucose 70 - 99 mg/dL - 11/03/2020) -  BUN 8 - 23 mg/dL - 626(R) -  Creatinine 48(N - 1.00 mg/dL - 4.62) -  Sodium 7.03(J - 145 mmol/L 145 142 145  Potassium 3.5 - 5.1 mmol/L 4.1 4.0 4.0  Chloride 98 - 111 mmol/L - 113(H) -  CO2 22 - 32 mmol/L - 23 -  Calcium 8.9 - 10.3 mg/dL - 6.8(L) -  Total Protein 6.5 - 8.1 g/dL - 4.0(L) -  Total Bilirubin 0.3 - 1.2 mg/dL - 3.6(H) -  Alkaline Phos 38 - 126 U/L - 50 -  AST 15 - 41 U/L - 113(H) -  ALT 0 - 44 U/L - 33 -    CXR: -  _______________________________________________________________  Assessment and Plan: Status post CABG x2, 5.5 Impella cannulation, VA ECMO cannulation  Patient remains critically ill.  Continue full support.  Appreciate cardiology and critical care We will continue to watch drain output. Will need a full washout tomorrow Resuscitate with product as needed.    009  O Veronica Swanson 10/29/2020 11:04 AM

## 2020-10-30 NOTE — Progress Notes (Signed)
Patient ID: Veronica Swanson, female   DOB: [birth year], 75 y.o.   MRN: [id number]    Advanced Heart Failure Rounding Note   Subjective:    - Chest washout 9/22.   On central VA ECMO with Impella at P-3 for vent  Had partial washout yesterday. Still bleeding. Got 2 more units RBCs yesterday + 1 pack PLTs and albumin. Still bleeding from sternal wound.   On epinephrine 6, NE 6  -> 16 VP 0.03, Milrinone 0.25. SvO2 68 on pump co-ox 95%.   Remains In AF/AFL rate 110-120s on IV amio  ECMO - see flowsheet  Impella P3, flow 1.4 L/min. No alarms  Scr 1.33 -> 1.5 -> 1.7 Hgb 8.6 -> 8.4 Platelets 45 -> 32 -> 60 (1 pack) -> 41 LDH 261 -> 442   Objective:   Weight Range:  Vital Signs:   Temp:  [97.7 F (36.5 C)-98.1 F (36.7 C)] 98.1 F (36.7 C) (09/25 0645) Pulse Rate:  [56-114] 102 (09/25 0746) Resp:  [0-28] 16 (09/25 0746) BP: (82-88)/(66-67) 88/66 (09/25 0312) SpO2:  [96 %-100 %] 100 % (09/25 0746) Arterial Line BP: (53-128)/(47-85) 88/65 (09/25 0645) FiO2 (%):  [40 %] 40 % (09/25 0746) Weight:  [84.1 kg] 84.1 kg (09/25 0630) Last BM Date: 10/29/20  Weight change: Filed Weights   10/28/20 0500 10/29/20 0500 10/26/2020 0630  Weight: 85.7 kg 83.8 kg 84.1 kg    Intake/Output:   Intake/Output Summary (Last 24 hours) at 10/22/2020 0828 Last data filed at 10/29/2020 0800 Gross per 24 hour  Intake 4086.97 ml  Output 2845 ml  Net 1241.97 ml      Physical Exam: General:  Intubated/sedated. Chest open HEENT: normal + ETT Neck: supple. RIJ swan. Carotids 2+ bilat; no bruits. No lymphadenopathy or thryomegaly appreciated. Cor: Chest open. + ECMO/Impella/CTs and JP drain. Dressing blood soaked Irreg tachy Lungs: coarse Abdomen: soft, nontender, nondistended. Hypoactive bowel sounds Extremities: no cyanosis, clubbing, rash, 3+ edema with blisters Neuro: intubated sedated  Telemetry: AFL 110-120s Personally reviewed   Labs: Basic Metabolic Panel: Recent Labs  Lab  10/13/2020 0419 11/04/2020 1113 10/28/20 0212 10/28/20 0436 10/28/20 1556 10/28/20 1558 10/28/20 2331 10/28/20 2333 10/29/20 0427 10/29/20 0429 10/29/20 1508 10/29/20 1511 10/29/20 1841 10/29/20 1938 10/22/2020 0403 10/08/2020 0436  NA 141   < > 141   < > 140   < > 140   < > 140   < > 142 144 145 145 142 145  K 4.8   < > 4.4   < > 3.6   < > 3.6   < > 3.3*   < > 3.7 3.7 4.0 4.0 4.0 4.1  CL 109   < > 112*  --  109  --  109  --  109  --  113*  --   --   --  113*  --   CO2 23   < > 23  --  23  --  21*  --  22  --  23  --   --   --  23  --   GLUCOSE 152*   < > 88  --  102*  --  189*  --  182*  --  146*  --   --   --  175*  --   BUN 8   < > 16  --  18  --  20  --  20  --  21  --   --   --  25*  --   CREATININE 1.25*   < > 1.33*  --  1.53*  --  1.60*  --  1.53*  --  1.54*  --   --   --  1.74*  --   CALCIUM 7.0*   < > 6.7*  --  7.3*  --  7.1*  --  7.1*  --  6.8*  --   --   --  6.8*  --   MG 1.8  --  1.7  --   --   --  2.0  --  2.0  --   --   --   --   --  1.8  --    < > = values in this interval not displayed.     Liver Function Tests: Recent Labs  Lab 10/26/20 0409 10/13/2020 0419 10/28/20 0212 10/29/20 0427 10/15/2020 0403  AST 63* 37 43* 58* 113*  ALT '22 16 18 21 ' 33  ALKPHOS 25* 21* 24* 28* 50  BILITOT 1.1 1.7* 3.0* 4.5* 3.6*  PROT 3.7* 3.8* 3.9* 4.2* 4.0*  ALBUMIN 2.2* 2.4* 2.3* 2.6* 2.0*    No results for input(s): LIPASE, AMYLASE in the last 168 hours. No results for input(s): AMMONIA in the last 168 hours.  CBC: Recent Labs  Lab 10/12/2020 2328 10/26/2020 2330 10/28/20 2219 10/28/20 2331 10/28/20 2333 10/29/20 0427 10/29/20 0429 10/29/20 1508 10/29/20 1511 10/29/20 1841 10/29/20 1938 10/29/2020 0403 10/09/2020 0436  WBC 2.7*   < > 6.1 5.6  --  4.6  --  5.0  --   --   --  5.0  --   NEUTROABS 2.1  --   --   --   --   --   --   --   --   --   --   --   --   HGB 7.3*   < > 8.1*  7.5* 7.9*   < > 8.4*   < > 7.9* 8.2* 8.8* 7.8* 9.1* 8.2*  HCT 22.4*   < > 24.0*  22.0* 24.0*    < > 25.1*   < > 23.5* 24.0* 26.0* 23.0* 27.7* 24.0*  MCV 82.7   < > 89.2 89.9  --  88.7  --  88.7  --   --   --  87.4  --   PLT 19*   < > 39* 34*  --  32*  --  60*  --   --   --  41*  --    < > = values in this interval not displayed.     Cardiac Enzymes: No results for input(s): CKTOTAL, CKMB, CKMBINDEX, TROPONINI in the last 168 hours.  BNP: BNP (last 3 results) Recent Labs    10/22/20 0954  BNP 1,154.2*     ProBNP (last 3 results) No results for input(s): PROBNP in the last 8760 hours.    Other results:  Imaging: DG Abd 1 View  Result Date: 10/28/2020 CLINICAL DATA:  Feeding tube placement. EXAM: ABDOMEN - 1 VIEW COMPARISON:  None. FINDINGS: Feeding tube evaluation was performed by the technologist, no radiologist was present during this exam. Two fluoroscopic spot views of the abdomen were obtained. A weighted enteric tube is present with tip in the proximal duodenum. 10 cc Gastrografin was administered to check tube placement. Contrast confirms proximal duodenal placement, with contrast extending distally after flushed with water. Fluoroscopy time 5 minutes 43 seconds. Dose 83.69 mGy. IMPRESSION: Fluoroscopic spot views confirm the weighted enteric tube  tip is in the proximal duodenum. Electronically Signed   By: Keith Rake M.D.   On: 10/28/2020 19:00   DG CHEST PORT 1 VIEW  Result Date: 10/26/2020 CLINICAL DATA:  On ECMO. EXAM: PORTABLE CHEST 1 VIEW COMPARISON:  10/29/2020 FINDINGS: Right IJ Swan-Ganz catheter tip is in the right ventricular outflow tract. ECMO support apparatus is again noted and appears similar to previous exam. Bilateral chest tubes are identified without pneumothorax. Feeding tube tip is in the expected location of the distal stomach or proximal duodenum. Diffuse low lung volumes. Increased opacification of the left lung. Similar appearance of right midlung atelectasis along the chest tube tract. IMPRESSION: 1. Stable support apparatus. 2. Increased  opacification of the left lung. 3. Stable right midlung atelectasis. Electronically Signed   By: Kerby Moors M.D.   On: 10/23/2020 06:04   DG CHEST PORT 1 VIEW  Result Date: 10/29/2020 CLINICAL DATA:  Reason for exam: Personal history of ECMO Hx of HTN, MI EXAM: PORTABLE CHEST 1 VIEW COMPARISON:  10/28/2020 FINDINGS: ECMO support apparatus is unchanged. Endotracheal tube tip projects 4 cm above the Carina. Right internal jugular Swan-Ganz catheter tip projects over the central cardiac silhouette. Nasal/orogastric tube passes well below the diaphragm and below the included field of view. Right sided mediastinal/pericardial chest tube is stable. Interstitial and hazy airspace lung opacities appear mildly improved from the previous day's study. There is denser opacity at the lung bases, left greater than right, which is stable. No pneumothorax. IMPRESSION: 1. Stable support apparatus compared to the previous day's exam. 2. Mild improvement in lung aeration. Some of this apparent improvement may be technical, due to changes in patient positioning and radiographic technique. No new lung abnormalities. 3. No pneumothorax. Electronically Signed   By: Lajean Manes M.D.   On: 10/29/2020 08:29   DG CHEST PORT 1 VIEW  Result Date: 10/28/2020 CLINICAL DATA:  Tamponade EXAM: PORTABLE CHEST 1 VIEW COMPARISON:  10/28/2020, 10/10/2020, 10/26/2020, 10/08/2020 FINDINGS: Endotracheal tube is about 3.5 cm superior to the carina. Esophageal tube tip below the diaphragm. Right IJ Swan-Ganz catheter projects over the right ventricular outflow tract. Bilateral chest tubes remain in place. ECMO cannulas similar in position. Radiopaque material or drain over the mid chest unchanged. Non inclusion of the tip of Impella device which is otherwise similar in orientation. Enlarged cardiomediastinal silhouette, stable as compared with radiograph earlier today, progressive compared to radiographs several days prior. Asymmetric hazy  opacity left thorax which may be due to layering pleural effusion and/or underlying edema. Overall worsened aeration on the left. Similar airspace disease on the right. Aortic atherosclerosis. IMPRESSION: 1. Grossly stable positioning of support lines and tubes. Right IJ Swan-Ganz catheter tip projecting over RVOT 2. Enlarged cardiomediastinal silhouette, grossly stable as compared with radiograph earlier today, progressive when compared to radiographs several days prior. 3. Worsened aeration. Probable layering pleural effusion with extensive bilateral airspace disease, likely edema Electronically Signed   By: Donavan Foil M.D.   On: 10/28/2020 22:28     Medications:     Scheduled Medications:  aspirin EC  325 mg Oral Daily   Or   aspirin  324 mg Per Tube Daily   bisacodyl  10 mg Oral Daily   Or   bisacodyl  10 mg Rectal Daily   chlorhexidine gluconate (MEDLINE KIT)  15 mL Mouth Rinse BID   Chlorhexidine Gluconate Cloth  6 each Topical Daily   docusate  100 mg Per Tube BID   insulin aspart  0-24  Units Subcutaneous Q4H   mouth rinse  15 mL Mouth Rinse 10 times per day   pantoprazole (PROTONIX) IV  40 mg Intravenous Q24H   polyethylene glycol  17 g Per Tube BID   rosuvastatin  40 mg Oral Daily   sennosides  10 mL Oral BID   sodium chloride flush  10-40 mL Intracatheter Q12H   sodium chloride flush  3 mL Intravenous Q12H    Infusions:  sodium chloride     sodium chloride Stopped (10/29/20 0611)   sodium chloride 10 mL/hr at 10/29/2020 0600   amiodarone 30 mg/hr (10/16/2020 0600)   cefUROXime (ZINACEF)  IV 1.5 g (10/19/2020 0805)   dexmedetomidine (PRECEDEX) IV infusion Stopped (10/28/20 0748)   epinephrine 6 mcg/min (10/12/2020 0600)   feeding supplement (PIVOT 1.5 CAL) 40 mL/hr at 10/24/2020 0600   heparin 50 units/mL (Impella PURGE) in dextrose 5 % 1000 mL bag     HYDROmorphone 4 mg/hr (10/24/2020 0636)   lactated ringers Stopped (10/29/20 0942)   midazolam 8 mg/hr (10/23/2020 0600)    milrinone 0.25 mcg/kg/min (10/08/2020 0600)   norepinephrine (LEVOPHED) Adult infusion 16 mcg/min (10/20/2020 0825)   vancomycin     vasopressin 0.03 Units/min (11/04/2020 0600)    PRN Medications: sodium chloride, sodium chloride, sodium chloride, artificial tears, HYDROmorphone, HYDROmorphone (DILAUDID) injection, HYDROmorphone (DILAUDID) injection, midazolam, ondansetron (ZOFRAN) IV   Assessment/Plan:   1. Shock: Cardiogenic/vasoplegic/hemorrhagic post-CABG with acute lung injury. Central cannulation for VA ECMO, ascending aorta and RA.  Impella 5.5 for venting (ascending aorta). Goal to eventually wean ECMO to Impella support alone.  Currently on epinephrine 6, NE 16 VP 0.03, Milrinone 0.25. SvO2 68 on pump co-ox 95%.  - Appears vasoplegic. Will cut milrinone to 0.125 - Did not tolerate volume removal with lasix. I/Os essentially equal with blood loss - Still bleeding from chest wound. Will likely need formal washout in OR with TCTS tomorrow - ECMO circuit stable, no systemic heparin with bleeding.  - Impella at P3, flow 1.4, no alarms.  Heparin in purge.   2. Acute blood loss Anemia/coagulopathy: Has had chest washout x 3.   - Still bleeding from chest wound. Had partial washout yesterday. Will likely need formal washout in OR with TCTS tomorrow - Rec'd 4u RBCs over last 24 hours. + 1 pack PLTs - Transfuse to keep Hgb > 8 3. Acute hypoxemic respiratory failure: ALI with pulmonary edema and pulmonary hemorrhage.  CXR with bilateral pulmonary edema (severe).   - Lung protective ventilation per CCM.  - Not ready for ECMO wean yet, need lungs clearer.  - Auto diuresing for now  4. Acute on chronic systolic CHF: Ischemic cardiomyopathy.  Pre-op echo with EF 35-40%.  Intra-op echo with LV EF 25% range but good RV function.  TEE 9/22 with RV small, moderate dysfunction and LV small, EF 20-25%.    - see above 5. Renal: SCr up again. Holding lasix 6. Thrombocytopenia: Due to coagulopathy. Watch  closely. Replace as needed - she is actively bleeding with PLTs < 50K. Will give PLTs 7. FEN: Cortrak in place. Continue TFs 8. Neuro: Per RN report was following commands on 9/23 9. PAF: Started 9/23. Rate controlled on amio gtt. Continue - no AC with bleeding   CRITICAL CARE Performed by: Glori Bickers  Total critical care time: 45 minutes  Critical care time was exclusive of separately billable procedures and treating other patients.  Critical care was necessary to treat or prevent imminent or life-threatening deterioration.  Critical care  was time spent personally by me (independent of midlevel providers or residents) on the following activities: development of treatment plan with patient and/or surrogate as well as nursing, discussions with consultants, evaluation of patient's response to treatment, examination of patient, obtaining history from patient or surrogate, ordering and performing treatments and interventions, ordering and review of laboratory studies, ordering and review of radiographic studies, pulse oximetry and re-evaluation of patient's condition.   Length of Stay: [place] 10/22/2020, 8:28 AM  Advanced Heart Failure Team Pager [phone] (M-F; 7a - 4p)  Please contact [facility] for night-coverage after hours (4p -7a ) and weekends on [email or web address]

## 2020-10-30 NOTE — Progress Notes (Signed)
VASCULAR LAB    Left lower extremity arterial duplex has been performed.  See CV proc for preliminary results.   Hershel Corkery, RVT 10/26/2020, 4:43 PM

## 2020-10-30 NOTE — Progress Notes (Addendum)
ANTICOAGULATION CONSULT NOTE Pharmacy Consult for heparin Indication:  Impella 5.5 + ECMO VA  Allergies  Allergen Reactions   Penicillins Other (See Comments)    Dizzy     Patient Measurements: Height: _0  (170.2 cm) Weight: 84.1 kg (185 lb 6.5 oz) IBW/kg (Calculated) : 61.6 Heparin Dosing Weight: 78.7 kg   Vital Signs: Temp: 98.1 F (36.7 C) (09/25 1900) Temp Source: Core (09/25 1836) BP: 103/77 (09/25 1618) Pulse Rate: 63 (09/25 1900)  Labs: Recent Labs    10/28/20 0434 10/28/20 0436 10/29/20 0427 10/29/20 0429 10/29/20 1508 10/29/20 1511 10/09/2020 0403 10/07/2020 0436 11/04/2020 1320 10/16/2020 1447  HGB 8.2*   < > 8.4*   < > 7.9*   < > 9.1* 8.2* 7.1* 7.8*  HCT 24.0*   < > 25.1*   < > 23.5*   < > 27.7* 24.0* 21.0* 23.0*  PLT 45*   < > 32*  --  60*  --  41*  --   --   --   APTT  --    < > 50*  --  46*  --  44*  --   --   --   LABPROT  --    < > 18.7*  --  18.1*  --  16.8*  --   --   --   INR  --    < > 1.6*  --  1.5*  --  1.4*  --   --   --   HEPARINUNFRC <0.10*  --  <0.10*  --   --   --  <0.10*  --   --   --   CREATININE  --    < > 1.53*  --  1.54*  --  1.74*  --   --   --    < > = values in this interval not displayed.     Estimated Creatinine Clearance: 30.7 mL/min (A) (by C-G formula based on SCr of 1.74 mg/dL (H)).   Medical History: Past Medical History:  Diagnosis Date   Carotid artery occlusion    Coronary artery disease    Hyperlipidemia    Hypertension    Myocardial infarction ([place]) 1997    Medications:  Scheduled:   aspirin EC  325 mg Oral Daily   Or   aspirin  324 mg Per Tube Daily   bisacodyl  10 mg Oral Daily   Or   bisacodyl  10 mg Rectal Daily   chlorhexidine gluconate (MEDLINE KIT)  15 mL Mouth Rinse BID   Chlorhexidine Gluconate Cloth  6 each Topical Daily   docusate  100 mg Per Tube BID   insulin aspart  0-24 Units Subcutaneous Q4H   mouth rinse  15 mL Mouth Rinse 10 times per day   pantoprazole (PROTONIX) IV  40 mg Intravenous  Q24H   polyethylene glycol  17 g Per Tube BID   rosuvastatin  40 mg Oral Daily   sennosides  10 mL Oral BID   sodium chloride flush  10-40 mL Intracatheter Q12H   sodium chloride flush  3 mL Intravenous Q12H    Assessment: 36 yof who underwent CABG x4 complicated cardiac arrest - now has impella 5.5 and on VA-ECMO. No AC PTA.  VA-ECMO + Impella 5.5 at P-3. Purge flow 7-7.5 mL/hr (350-375 units/hr of heparin from purge solution). Purge pressure stable 500s.   Still oozing from open chest, Hgb 9.4, plt up to 72 (received PRBC and plt today). ECMO circuit assessed for  clots, none noted.   Duplex tonight showing severely dampened monophasic flow in common femoral, femoral, profunda, and popliteal arteries. Also with absent flow to posterior and anterior tibial arteries.   Discussed with CTVS - given critical limb ischemia plan to start systemic heparin and plan to proceed with mechanical thrombectomy.  Goal of Therapy:  Heparin level <0.3 units/ml Monitor platelets by anticoagulation protocol: Yes   Plan:  Continue heparin purge solution (50 units/mL) with Impella Start systemic heparin infusion at 200 units/hr (total heparin amount will be ~550-575 units/hr with systemic + purge solution) Will order heparin level in 8 hours Monitor s/sx of bleeding closely  Antonietta Jewel, PharmD, [facility] Pharmacist  Phone: [phone] 10/26/2020 7:47 PM  Please check AMION for all [place] phone numbers After 10:00 PM, call [place] [phone]

## 2020-10-30 NOTE — Progress Notes (Signed)
Pt was being transported to OR. CRNA disconnected Pt from vent and bagged during transport.

## 2020-10-30 NOTE — Progress Notes (Signed)
0800-1100, Pt had pulses in LLE, loss pulses on 1200 assessment. Dr. Lynnell Catalan had me page on call vascular where he came to bedside and was unable to doppler pulses. Dr. Cliffton Asters notified also. Will continue to monitor.  Judeth Horn, RN

## 2020-10-30 NOTE — Progress Notes (Signed)
Sputum sample obtained and sent to lab.  

## 2020-10-30 NOTE — Progress Notes (Signed)
     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  Case discussed from a multidisciplinary standpoint, and although she is very high risk, decision was made to proceed with mechanical thrombectomy.  Family was contacted, but did not answer.  Will start heparin gtt for now, and will attempt to contact family again.  Jacinda Kanady Keane Scrape

## 2020-10-30 NOTE — Anesthesia Preprocedure Evaluation (Addendum)
Anesthesia Evaluation  Patient identified by MRN, date of birth, ID band Patient unresponsive    Reviewed: Allergy & Precautions, Patient's Chart, lab work & pertinent test results, Unable to perform ROS - Chart review only  Airway Mallampati: Intubated       Dental  (+) Edentulous Lower, Edentulous Upper   Pulmonary Current Smoker and Patient abstained from smoking.,     + decreased breath sounds  + intubated    Cardiovascular hypertension, Pt. on medications + CAD, + Past MI, + Cardiac Stents, + CABG and +CHF   Rhythm:Irregular Rate:Normal   S/p CABG 10/27/2020, on ECMO with impella in place since procedure  '22 TEE in ICU - EF 20 to 25%. The left ventricle is small/decompressed. Global hypokinesis. There is mild left ventricular hypertrophy. Impella at 5.4 cm in the left ventricle. Right ventricular systolic function is moderately reduced. ECMO inflow cannula noted in right atrium. Mild posterior leaflet prolapse with mild MR. Impella catheter across aortic valve. Aortic valve regurgitation is trivial.     Neuro/Psych    GI/Hepatic   Endo/Other   Ca 6.2   Renal/GU Renal InsufficiencyRenal disease     Musculoskeletal   Abdominal   Peds  Hematology  (+) anemia ,  Plt 72k INR 1.5    Anesthesia Other Findings CAD  LMD  Reproductive/Obstetrics                            Anesthesia Physical  Anesthesia Plan  ASA: 4  Anesthesia Plan: General   Post-op Pain Management:    Induction: Intravenous  PONV Risk Score and Plan: 2 and Treatment may vary due to age or medical condition  Airway Management Planned: Oral ETT  Additional Equipment:   Intra-op Plan:   Post-operative Plan: Post-operative intubation/ventilation  Informed Consent:     History available from chart only  Plan Discussed with: CRNA and Anesthesiologist  Anesthesia Plan Comments:        Anesthesia  Quick Evaluation

## 2020-10-30 NOTE — Progress Notes (Signed)
Patient ID: Veronica Swanson, female   DOB: 1946/01/28, 75 y.o.   MRN: [id number] Extracorporeal support note   ECLS support day: 5 Indication: Post-cardiotomy cardiogenic shock  Configuration: Central RA/Ao  Pump speed: 3080 rpm Pump flow: 3.8 L/min  Sweep gas: 3.5 -> 4.5 -> 5.0  Circuit check: small amount thrombus Anticoagulant: None, only heparin in Impella purge due to ongoing bleeding Anticoagulation targets: N/A  Changes in support: None  Anticipated goals/duration of support: Wean to Impella.   Impella 5.5: P-3. Flow 1.4L  No alarms. Waveforms ok.   Reuel Boom Tram Wrenn 10/26/2020 8:27 AM

## 2020-10-31 ENCOUNTER — Inpatient Hospital Stay (HOSPITAL_COMMUNITY): Payer: Medicare Other | Admitting: Anesthesiology

## 2020-10-31 ENCOUNTER — Encounter (HOSPITAL_COMMUNITY): Payer: Self-pay | Admitting: Cardiology

## 2020-10-31 ENCOUNTER — Other Ambulatory Visit: Payer: Medicare Other

## 2020-10-31 ENCOUNTER — Inpatient Hospital Stay (HOSPITAL_COMMUNITY): Payer: Medicare Other

## 2020-10-31 ENCOUNTER — Encounter (HOSPITAL_COMMUNITY)
Admission: EM | Disposition: E | Payer: Self-pay | Source: Home / Self Care | Attending: Thoracic Surgery (Cardiothoracic Vascular Surgery)

## 2020-10-31 DIAGNOSIS — J95821 Acute postprocedural respiratory failure: Secondary | ICD-10-CM | POA: Diagnosis not present

## 2020-10-31 DIAGNOSIS — R079 Chest pain, unspecified: Secondary | ICD-10-CM | POA: Diagnosis not present

## 2020-10-31 DIAGNOSIS — Z951 Presence of aortocoronary bypass graft: Secondary | ICD-10-CM | POA: Diagnosis not present

## 2020-10-31 DIAGNOSIS — R570 Cardiogenic shock: Secondary | ICD-10-CM | POA: Diagnosis not present

## 2020-10-31 DIAGNOSIS — I743 Embolism and thrombosis of arteries of the lower extremities: Secondary | ICD-10-CM | POA: Diagnosis not present

## 2020-10-31 HISTORY — PX: TEE WITHOUT CARDIOVERSION: SHX5443

## 2020-10-31 HISTORY — PX: EXPLORATION POST OPERATIVE OPEN HEART: SHX5061

## 2020-10-31 HISTORY — PX: CANNULATION FOR ECMO (EXTRACORPOREAL MEMBRANE OXYGENATION): SHX6796

## 2020-10-31 LAB — CBC WITH DIFFERENTIAL/PLATELET
Abs Immature Granulocytes: 0.17 10*3/uL — ABNORMAL HIGH (ref 0.00–0.07)
Basophils Absolute: 0 10*3/uL (ref 0.0–0.1)
Basophils Relative: 0 %
Eosinophils Absolute: 0.1 10*3/uL (ref 0.0–0.5)
Eosinophils Relative: 1 %
HCT: 25.5 % — ABNORMAL LOW (ref 36.0–46.0)
Hemoglobin: 8.6 g/dL — ABNORMAL LOW (ref 12.0–15.0)
Immature Granulocytes: 2 %
Lymphocytes Relative: 5 %
Lymphs Abs: 0.3 10*3/uL — ABNORMAL LOW (ref 0.7–4.0)
MCH: 29.6 pg (ref 26.0–34.0)
MCHC: 33.7 g/dL (ref 30.0–36.0)
MCV: 87.6 fL (ref 80.0–100.0)
Monocytes Absolute: 0.5 10*3/uL (ref 0.1–1.0)
Monocytes Relative: 7 %
Neutro Abs: 6.5 10*3/uL (ref 1.7–7.7)
Neutrophils Relative %: 85 %
Platelets: 50 10*3/uL — ABNORMAL LOW (ref 150–400)
RBC: 2.91 MIL/uL — ABNORMAL LOW (ref 3.87–5.11)
RDW: 16.6 % — ABNORMAL HIGH (ref 11.5–15.5)
Smear Review: NORMAL
WBC: 7.6 10*3/uL (ref 4.0–10.5)
nRBC: 2.8 % — ABNORMAL HIGH (ref 0.0–0.2)

## 2020-10-31 LAB — POCT I-STAT 7, (LYTES, BLD GAS, ICA,H+H)
Acid-Base Excess: 0 mmol/L (ref 0.0–2.0)
Acid-base deficit: 1 mmol/L (ref 0.0–2.0)
Acid-base deficit: 1 mmol/L (ref 0.0–2.0)
Acid-base deficit: 1 mmol/L (ref 0.0–2.0)
Acid-base deficit: 1 mmol/L (ref 0.0–2.0)
Acid-base deficit: 2 mmol/L (ref 0.0–2.0)
Acid-base deficit: 2 mmol/L (ref 0.0–2.0)
Acid-base deficit: 2 mmol/L (ref 0.0–2.0)
Acid-base deficit: 3 mmol/L — ABNORMAL HIGH (ref 0.0–2.0)
Acid-base deficit: 4 mmol/L — ABNORMAL HIGH (ref 0.0–2.0)
Acid-base deficit: 5 mmol/L — ABNORMAL HIGH (ref 0.0–2.0)
Bicarbonate: 18.2 mmol/L — ABNORMAL LOW (ref 20.0–28.0)
Bicarbonate: 20.7 mmol/L (ref 20.0–28.0)
Bicarbonate: 21.8 mmol/L (ref 20.0–28.0)
Bicarbonate: 22.9 mmol/L (ref 20.0–28.0)
Bicarbonate: 23 mmol/L (ref 20.0–28.0)
Bicarbonate: 23 mmol/L (ref 20.0–28.0)
Bicarbonate: 23.8 mmol/L (ref 20.0–28.0)
Bicarbonate: 24 mmol/L (ref 20.0–28.0)
Bicarbonate: 24 mmol/L (ref 20.0–28.0)
Bicarbonate: 24.1 mmol/L (ref 20.0–28.0)
Bicarbonate: 25.7 mmol/L (ref 20.0–28.0)
Calcium, Ion: 0.84 mmol/L — CL (ref 1.15–1.40)
Calcium, Ion: 0.9 mmol/L — ABNORMAL LOW (ref 1.15–1.40)
Calcium, Ion: 0.95 mmol/L — ABNORMAL LOW (ref 1.15–1.40)
Calcium, Ion: 0.99 mmol/L — ABNORMAL LOW (ref 1.15–1.40)
Calcium, Ion: 0.99 mmol/L — ABNORMAL LOW (ref 1.15–1.40)
Calcium, Ion: 1.01 mmol/L — ABNORMAL LOW (ref 1.15–1.40)
Calcium, Ion: 1.01 mmol/L — ABNORMAL LOW (ref 1.15–1.40)
Calcium, Ion: 1.03 mmol/L — ABNORMAL LOW (ref 1.15–1.40)
Calcium, Ion: 1.03 mmol/L — ABNORMAL LOW (ref 1.15–1.40)
Calcium, Ion: 1.06 mmol/L — ABNORMAL LOW (ref 1.15–1.40)
Calcium, Ion: 1.07 mmol/L — ABNORMAL LOW (ref 1.15–1.40)
HCT: 18 % — ABNORMAL LOW (ref 36.0–46.0)
HCT: 19 % — ABNORMAL LOW (ref 36.0–46.0)
HCT: 19 % — ABNORMAL LOW (ref 36.0–46.0)
HCT: 20 % — ABNORMAL LOW (ref 36.0–46.0)
HCT: 21 % — ABNORMAL LOW (ref 36.0–46.0)
HCT: 21 % — ABNORMAL LOW (ref 36.0–46.0)
HCT: 21 % — ABNORMAL LOW (ref 36.0–46.0)
HCT: 21 % — ABNORMAL LOW (ref 36.0–46.0)
HCT: 22 % — ABNORMAL LOW (ref 36.0–46.0)
HCT: 22 % — ABNORMAL LOW (ref 36.0–46.0)
HCT: 24 % — ABNORMAL LOW (ref 36.0–46.0)
Hemoglobin: 6.1 g/dL — CL (ref 12.0–15.0)
Hemoglobin: 6.5 g/dL — CL (ref 12.0–15.0)
Hemoglobin: 6.5 g/dL — CL (ref 12.0–15.0)
Hemoglobin: 6.8 g/dL — CL (ref 12.0–15.0)
Hemoglobin: 7.1 g/dL — ABNORMAL LOW (ref 12.0–15.0)
Hemoglobin: 7.1 g/dL — ABNORMAL LOW (ref 12.0–15.0)
Hemoglobin: 7.1 g/dL — ABNORMAL LOW (ref 12.0–15.0)
Hemoglobin: 7.1 g/dL — ABNORMAL LOW (ref 12.0–15.0)
Hemoglobin: 7.5 g/dL — ABNORMAL LOW (ref 12.0–15.0)
Hemoglobin: 7.5 g/dL — ABNORMAL LOW (ref 12.0–15.0)
Hemoglobin: 8.2 g/dL — ABNORMAL LOW (ref 12.0–15.0)
O2 Saturation: 100 %
O2 Saturation: 100 %
O2 Saturation: 100 %
O2 Saturation: 100 %
O2 Saturation: 100 %
O2 Saturation: 100 %
O2 Saturation: 100 %
O2 Saturation: 100 %
O2 Saturation: 100 %
O2 Saturation: 100 %
O2 Saturation: 66 %
Patient temperature: 36.1
Patient temperature: 36.3
Patient temperature: 36.3
Patient temperature: 36.3
Patient temperature: 36.4
Patient temperature: 36.4
Patient temperature: 36.6
Patient temperature: 36.6
Patient temperature: 36.7
Patient temperature: 36.8
Patient temperature: 37
Potassium: 4.4 mmol/L (ref 3.5–5.1)
Potassium: 4.5 mmol/L (ref 3.5–5.1)
Potassium: 4.5 mmol/L (ref 3.5–5.1)
Potassium: 4.5 mmol/L (ref 3.5–5.1)
Potassium: 4.6 mmol/L (ref 3.5–5.1)
Potassium: 4.7 mmol/L (ref 3.5–5.1)
Potassium: 4.7 mmol/L (ref 3.5–5.1)
Potassium: 4.7 mmol/L (ref 3.5–5.1)
Potassium: 4.7 mmol/L (ref 3.5–5.1)
Potassium: 4.8 mmol/L (ref 3.5–5.1)
Potassium: 5.1 mmol/L (ref 3.5–5.1)
Sodium: 142 mmol/L (ref 135–145)
Sodium: 142 mmol/L (ref 135–145)
Sodium: 143 mmol/L (ref 135–145)
Sodium: 143 mmol/L (ref 135–145)
Sodium: 143 mmol/L (ref 135–145)
Sodium: 144 mmol/L (ref 135–145)
Sodium: 144 mmol/L (ref 135–145)
Sodium: 145 mmol/L (ref 135–145)
Sodium: 145 mmol/L (ref 135–145)
Sodium: 145 mmol/L (ref 135–145)
Sodium: 148 mmol/L — ABNORMAL HIGH (ref 135–145)
TCO2: 19 mmol/L — ABNORMAL LOW (ref 22–32)
TCO2: 22 mmol/L (ref 22–32)
TCO2: 23 mmol/L (ref 22–32)
TCO2: 24 mmol/L (ref 22–32)
TCO2: 24 mmol/L (ref 22–32)
TCO2: 24 mmol/L (ref 22–32)
TCO2: 25 mmol/L (ref 22–32)
TCO2: 25 mmol/L (ref 22–32)
TCO2: 25 mmol/L (ref 22–32)
TCO2: 25 mmol/L (ref 22–32)
TCO2: 27 mmol/L (ref 22–32)
pCO2 arterial: 24.7 mmHg — ABNORMAL LOW (ref 32.0–48.0)
pCO2 arterial: 33.8 mmHg (ref 32.0–48.0)
pCO2 arterial: 35.1 mmHg (ref 32.0–48.0)
pCO2 arterial: 36.1 mmHg (ref 32.0–48.0)
pCO2 arterial: 36.8 mmHg (ref 32.0–48.0)
pCO2 arterial: 37.4 mmHg (ref 32.0–48.0)
pCO2 arterial: 39 mmHg (ref 32.0–48.0)
pCO2 arterial: 40.7 mmHg (ref 32.0–48.0)
pCO2 arterial: 41.2 mmHg (ref 32.0–48.0)
pCO2 arterial: 41.8 mmHg (ref 32.0–48.0)
pCO2 arterial: 43.2 mmHg (ref 32.0–48.0)
pH, Arterial: 7.358 (ref 7.350–7.450)
pH, Arterial: 7.36 (ref 7.350–7.450)
pH, Arterial: 7.363 (ref 7.350–7.450)
pH, Arterial: 7.371 (ref 7.350–7.450)
pH, Arterial: 7.381 (ref 7.350–7.450)
pH, Arterial: 7.396 (ref 7.350–7.450)
pH, Arterial: 7.4 (ref 7.350–7.450)
pH, Arterial: 7.414 (ref 7.350–7.450)
pH, Arterial: 7.414 (ref 7.350–7.450)
pH, Arterial: 7.421 (ref 7.350–7.450)
pH, Arterial: 7.475 — ABNORMAL HIGH (ref 7.350–7.450)
pO2, Arterial: 171 mmHg — ABNORMAL HIGH (ref 83.0–108.0)
pO2, Arterial: 328 mmHg — ABNORMAL HIGH (ref 83.0–108.0)
pO2, Arterial: 34 mmHg — CL (ref 83.0–108.0)
pO2, Arterial: 341 mmHg — ABNORMAL HIGH (ref 83.0–108.0)
pO2, Arterial: 362 mmHg — ABNORMAL HIGH (ref 83.0–108.0)
pO2, Arterial: 406 mmHg — ABNORMAL HIGH (ref 83.0–108.0)
pO2, Arterial: 415 mmHg — ABNORMAL HIGH (ref 83.0–108.0)
pO2, Arterial: 418 mmHg — ABNORMAL HIGH (ref 83.0–108.0)
pO2, Arterial: 420 mmHg — ABNORMAL HIGH (ref 83.0–108.0)
pO2, Arterial: 421 mmHg — ABNORMAL HIGH (ref 83.0–108.0)
pO2, Arterial: 505 mmHg — ABNORMAL HIGH (ref 83.0–108.0)

## 2020-10-31 LAB — TYPE AND SCREEN
ABO/RH(D): A POS
Antibody Screen: NEGATIVE
Unit division: 0
Unit division: 0
Unit division: 0
Unit division: 0
Unit division: 0
Unit division: 0
Unit division: 0
Unit division: 0
Unit division: 0
Unit division: 0

## 2020-10-31 LAB — CBC
HCT: 21.8 % — ABNORMAL LOW (ref 36.0–46.0)
HCT: 25.5 % — ABNORMAL LOW (ref 36.0–46.0)
Hemoglobin: 7.6 g/dL — ABNORMAL LOW (ref 12.0–15.0)
Hemoglobin: 8.7 g/dL — ABNORMAL LOW (ref 12.0–15.0)
MCH: 30.6 pg (ref 26.0–34.0)
MCH: 30 pg (ref 26.0–34.0)
MCHC: 34.9 g/dL (ref 30.0–36.0)
MCHC: 34.1 g/dL (ref 30.0–36.0)
MCV: 87.9 fL (ref 80.0–100.0)
MCV: 87.9 fL (ref 80.0–100.0)
Platelets: 42 10*3/uL — ABNORMAL LOW (ref 150–400)
Platelets: 52 10*3/uL — ABNORMAL LOW (ref 150–400)
RBC: 2.48 MIL/uL — ABNORMAL LOW (ref 3.87–5.11)
RBC: 2.9 MIL/uL — ABNORMAL LOW (ref 3.87–5.11)
RDW: 16.8 % — ABNORMAL HIGH (ref 11.5–15.5)
RDW: 15.3 % (ref 11.5–15.5)
WBC: 7.9 10*3/uL (ref 4.0–10.5)
WBC: 9.7 10*3/uL (ref 4.0–10.5)
nRBC: 3.4 % — ABNORMAL HIGH (ref 0.0–0.2)
nRBC: 2.4 % — ABNORMAL HIGH (ref 0.0–0.2)

## 2020-10-31 LAB — GLUCOSE, CAPILLARY
Glucose-Capillary: 138 mg/dL — ABNORMAL HIGH (ref 70–99)
Glucose-Capillary: 140 mg/dL — ABNORMAL HIGH (ref 70–99)
Glucose-Capillary: 151 mg/dL — ABNORMAL HIGH (ref 70–99)
Glucose-Capillary: 157 mg/dL — ABNORMAL HIGH (ref 70–99)
Glucose-Capillary: 130 mg/dL — ABNORMAL HIGH (ref 70–99)
Glucose-Capillary: 113 mg/dL — ABNORMAL HIGH (ref 70–99)

## 2020-10-31 LAB — COOXEMETRY PANEL
Carboxyhemoglobin: 1.7 % — ABNORMAL HIGH (ref 0.5–1.5)
Methemoglobin: 2.1 % — ABNORMAL HIGH (ref 0.0–1.5)
O2 Saturation: 97.1 %
Total hemoglobin: <4 g/dL — CL (ref 12.0–16.0)

## 2020-10-31 LAB — POCT I-STAT EG7
Acid-base deficit: 4 mmol/L — ABNORMAL HIGH (ref 0.0–2.0)
Bicarbonate: 21.2 mmol/L (ref 20.0–28.0)
Calcium, Ion: 1.02 mmol/L — ABNORMAL LOW (ref 1.15–1.40)
HCT: 20 % — ABNORMAL LOW (ref 36.0–46.0)
Hemoglobin: 6.8 g/dL — CL (ref 12.0–15.0)
O2 Saturation: 67 %
Patient temperature: 36.5
Potassium: 4.8 mmol/L (ref 3.5–5.1)
Sodium: 145 mmol/L (ref 135–145)
TCO2: 22 mmol/L (ref 22–32)
pCO2, Ven: 38.6 mmHg — ABNORMAL LOW (ref 44.0–60.0)
pH, Ven: 7.345 (ref 7.250–7.430)
pO2, Ven: 35 mmHg (ref 32.0–45.0)

## 2020-10-31 LAB — BPAM RBC
Blood Product Expiration Date: 202210252359
Blood Product Expiration Date: 202210252359
Blood Product Expiration Date: 202210252359
Blood Product Expiration Date: 202210252359
Blood Product Expiration Date: 202210252359
Blood Product Expiration Date: 202210252359
Blood Product Expiration Date: 202210252359
Blood Product Expiration Date: 202210252359
Blood Product Expiration Date: 202210252359
Blood Product Expiration Date: 202210262359
ISSUE DATE / TIME: 202209231842
ISSUE DATE / TIME: 202209240030
ISSUE DATE / TIME: 202209241659
ISSUE DATE / TIME: 202209250043
ISSUE DATE / TIME: 202209251335
ISSUE DATE / TIME: 202209251335
ISSUE DATE / TIME: 202209252213
ISSUE DATE / TIME: 202209252213
ISSUE DATE / TIME: 202209252213
ISSUE DATE / TIME: 202209252213
Unit Type and Rh: 6200
Unit Type and Rh: 6200
Unit Type and Rh: 6200
Unit Type and Rh: 6200
Unit Type and Rh: 6200
Unit Type and Rh: 6200
Unit Type and Rh: 6200
Unit Type and Rh: 6200
Unit Type and Rh: 6200
Unit Type and Rh: 6200

## 2020-10-31 LAB — HEPATIC FUNCTION PANEL
ALT: 40 U/L (ref 0–44)
AST: 177 U/L — ABNORMAL HIGH (ref 15–41)
Albumin: 1.8 g/dL — ABNORMAL LOW (ref 3.5–5.0)
Alkaline Phosphatase: 61 U/L (ref 38–126)
Bilirubin, Direct: 1.4 mg/dL — ABNORMAL HIGH (ref 0.0–0.2)
Indirect Bilirubin: 1.1 mg/dL — ABNORMAL HIGH (ref 0.3–0.9)
Total Bilirubin: 2.5 mg/dL — ABNORMAL HIGH (ref 0.3–1.2)
Total Protein: 3.3 g/dL — ABNORMAL LOW (ref 6.5–8.1)

## 2020-10-31 LAB — GLOBAL TEG PANEL
CFF Max Amplitude: 23 mm (ref 15–32)
CK with Heparinase (R): 12.7 min — ABNORMAL HIGH (ref 4.3–8.3)
Citrated Functional Fibrinogen: 419.7 mg/dL (ref 278–581)
Citrated Kaolin (MA): <40 mm — ABNORMAL LOW (ref 52–69)
Citrated Kaolin (R): >17 min — ABNORMAL HIGH (ref 4.6–9.1)
Citrated Kaolin Angle: <39 deg — ABNORMAL LOW (ref 63–78)
Citrated Rapid TEG (MA): 62.4 mm (ref 52–70)

## 2020-10-31 LAB — BASIC METABOLIC PANEL
Anion gap: 8 (ref 5–15)
Anion gap: 8 (ref 5–15)
BUN: 34 mg/dL — ABNORMAL HIGH (ref 8–23)
BUN: 41 mg/dL — ABNORMAL HIGH (ref 8–23)
CO2: 21 mmol/L — ABNORMAL LOW (ref 22–32)
CO2: 20 mmol/L — ABNORMAL LOW (ref 22–32)
Calcium: 6.3 mg/dL — CL (ref 8.9–10.3)
Calcium: 6.1 mg/dL — CL (ref 8.9–10.3)
Chloride: 112 mmol/L — ABNORMAL HIGH (ref 98–111)
Chloride: 114 mmol/L — ABNORMAL HIGH (ref 98–111)
Creatinine, Ser: 2.26 mg/dL — ABNORMAL HIGH (ref 0.44–1.00)
Creatinine, Ser: 2.67 mg/dL — ABNORMAL HIGH (ref 0.44–1.00)
GFR, Estimated: 22 mL/min — ABNORMAL LOW (ref >60)
GFR, Estimated: 18 mL/min — ABNORMAL LOW (ref >60)
Glucose, Bld: 167 mg/dL — ABNORMAL HIGH (ref 70–99)
Glucose, Bld: 132 mg/dL — ABNORMAL HIGH (ref 70–99)
Potassium: 4.8 mmol/L (ref 3.5–5.1)
Potassium: 4.6 mmol/L (ref 3.5–5.1)
Sodium: 141 mmol/L (ref 135–145)
Sodium: 142 mmol/L (ref 135–145)

## 2020-10-31 LAB — PROTIME-INR
INR: 1.7 — ABNORMAL HIGH (ref 0.8–1.2)
Prothrombin Time: 20.1 seconds — ABNORMAL HIGH (ref 11.4–15.2)

## 2020-10-31 LAB — BPAM PLATELET PHERESIS
Blood Product Expiration Date: 202209252359
ISSUE DATE / TIME: 202209251335
Unit Type and Rh: 6200

## 2020-10-31 LAB — PREPARE RBC (CROSSMATCH)

## 2020-10-31 LAB — PREPARE PLATELET PHERESIS: Unit division: 0

## 2020-10-31 LAB — FIBRINOGEN: Fibrinogen: 376 mg/dL (ref 210–475)

## 2020-10-31 LAB — POCT ACTIVATED CLOTTING TIME: Activated Clotting Time: 155 seconds

## 2020-10-31 LAB — PHOSPHORUS: Phosphorus: 3.3 mg/dL (ref 2.5–4.6)

## 2020-10-31 LAB — ECHO INTRAOPERATIVE TEE
Height: 67 in
Weight: 3178.15 oz

## 2020-10-31 LAB — HEPARIN LEVEL (UNFRACTIONATED): Heparin Unfractionated: <0.1 IU/mL — ABNORMAL LOW (ref 0.30–0.70)

## 2020-10-31 LAB — APTT: aPTT: 80 seconds — ABNORMAL HIGH (ref 24–36)

## 2020-10-31 LAB — MAGNESIUM: Magnesium: 1.9 mg/dL (ref 1.7–2.4)

## 2020-10-31 LAB — LACTATE DEHYDROGENASE: LDH: 698 U/L — ABNORMAL HIGH (ref 98–192)

## 2020-10-31 SURGERY — EXPLORATION POST OPERATIVE OPEN HEART
Anesthesia: General

## 2020-10-31 MED ORDER — ALBUMIN HUMAN 5 % IV SOLN
INTRAVENOUS | Status: AC
  Filled 2020-10-31: qty 500

## 2020-10-31 MED ORDER — ROSUVASTATIN CALCIUM 5 MG PO TABS
10.0000 mg | ORAL_TABLET | Freq: Every day | ORAL | Status: DC
  Administered 2020-11-01: 10 mg via ORAL
  Filled 2020-10-31: qty 2

## 2020-10-31 MED ORDER — CALCIUM GLUCONATE-NACL 2-0.675 GM/100ML-% IV SOLN
2.0000 g | Freq: Once | INTRAVENOUS | Status: AC
  Administered 2020-10-31: 2000 mg via INTRAVENOUS
  Filled 2020-10-31: qty 100

## 2020-10-31 MED ORDER — PROSOURCE TF PO LIQD
45.0000 mL | Freq: Four times a day (QID) | ORAL | Status: DC
  Administered 2020-10-31 – 2020-11-01 (×5): 45 mL
  Filled 2020-10-31 (×3): qty 45

## 2020-10-31 MED ORDER — FLUCONAZOLE IN SODIUM CHLORIDE 200-0.9 MG/100ML-% IV SOLN
200.0000 mg | INTRAVENOUS | Status: DC
  Administered 2020-10-31: 200 mg via INTRAVENOUS
  Filled 2020-10-31 (×2): qty 100

## 2020-10-31 MED ORDER — SODIUM CHLORIDE 0.9% IV SOLUTION: Freq: Once | INTRAVENOUS | Status: AC

## 2020-10-31 MED ORDER — ROCURONIUM BROMIDE 10 MG/ML (PF) SYRINGE
PREFILLED_SYRINGE | INTRAVENOUS | Status: DC | PRN
  Administered 2020-10-31: 100 mg via INTRAVENOUS

## 2020-10-31 MED ORDER — SODIUM CHLORIDE 0.9 % IV SOLN: INTRAVENOUS | Status: DC | PRN

## 2020-10-31 MED ORDER — PIVOT 1.5 CAL PO LIQD
1000.0000 mL | ORAL | Status: DC
  Administered 2020-11-01: 1000 mL
  Filled 2020-10-31: qty 1000

## 2020-10-31 MED ORDER — HEPARIN SODIUM (PORCINE) 1000 UNIT/ML IJ SOLN
INTRAMUSCULAR | Status: AC
  Filled 2020-10-31: qty 1

## 2020-10-31 MED ORDER — PANTOPRAZOLE SODIUM 40 MG IV SOLR
40.0000 mg | Freq: Two times a day (BID) | INTRAVENOUS | Status: DC
  Administered 2020-10-31 – 2020-11-01 (×2): 40 mg via INTRAVENOUS
  Filled 2020-10-31 (×2): qty 40

## 2020-10-31 MED ORDER — HEMOSTATIC AGENTS (NO CHARGE) OPTIME
TOPICAL | Status: DC | PRN
  Administered 2020-10-31: 2 via TOPICAL

## 2020-10-31 MED ORDER — PROTAMINE SULFATE 10 MG/ML IV SOLN
INTRAVENOUS | Status: DC | PRN
  Administered 2020-10-31 (×2): 25 mg via INTRAVENOUS

## 2020-10-31 MED ORDER — MAGNESIUM SULFATE 2 GM/50ML IV SOLN
2.0000 g | Freq: Once | INTRAVENOUS | Status: AC
  Administered 2020-10-31: 2 g via INTRAVENOUS
  Filled 2020-10-31: qty 50

## 2020-10-31 MED ORDER — SODIUM CHLORIDE 0.9% IV SOLUTION: Freq: Once | INTRAVENOUS | Status: DC

## 2020-10-31 MED ORDER — ALBUMIN HUMAN 5 % IV SOLN: INTRAVENOUS | Status: DC | PRN

## 2020-10-31 MED ORDER — 0.9 % SODIUM CHLORIDE (POUR BTL) OPTIME
TOPICAL | Status: DC | PRN
  Administered 2020-10-31: 3000 mL

## 2020-10-31 MED ORDER — PROTAMINE SULFATE 10 MG/ML IV SOLN
INTRAVENOUS | Status: AC
  Filled 2020-10-31: qty 5

## 2020-10-31 SURGICAL SUPPLY — 99 items
ADAPTER CARDIO PERF ANTE/RETRO (ADAPTER) IMPLANT
ADPR PRFSN 84XANTGRD RTRGD (ADAPTER)
ANCHOR CATH FOLEY SECURE (MISCELLANEOUS) ×4 IMPLANT
APPLIER CLIP 9.375 MED OPEN (MISCELLANEOUS)
APPLIER CLIP 9.375 SM OPEN (CLIP)
APR CLP MED 9.3 20 MLT OPN (MISCELLANEOUS)
APR CLP SM 9.3 20 MLT OPN (CLIP)
BAG DECANTER FOR FLEXI CONT (MISCELLANEOUS) ×2 IMPLANT
BANDAGE ESMARK 6X9 LF (GAUZE/BANDAGES/DRESSINGS) IMPLANT
BLADE CLIPPER SURG (BLADE) ×2 IMPLANT
BLADE SURG 11 STRL SS (BLADE) ×1 IMPLANT
BNDG CMPR 9X6 STRL LF SNTH (GAUZE/BANDAGES/DRESSINGS) ×2
BNDG ESMARK 6X9 LF (GAUZE/BANDAGES/DRESSINGS) ×3
CANISTER SUCT 3000ML PPV (MISCELLANEOUS) ×3 IMPLANT
CANNULA GUNDRY RCSP 15FR (MISCELLANEOUS) IMPLANT
CATH THORACIC 28FR (CATHETERS) IMPLANT
CLIP APPLIE 9.375 MED OPEN (MISCELLANEOUS) IMPLANT
CLIP APPLIE 9.375 SM OPEN (CLIP) IMPLANT
CLIP FOGARTY SPRING 6M (CLIP) IMPLANT
CLIP TI MEDIUM 6 (CLIP) ×1 IMPLANT
CLIP TI WIDE RED SMALL 6 (CLIP) ×1 IMPLANT
CLIP VESOCCLUDE MED 24/CT (CLIP) IMPLANT
CLIP VESOCCLUDE MED 6/CT (CLIP) IMPLANT
CLIP VESOCCLUDE SM WIDE 24/CT (CLIP) IMPLANT
CONN 3/8X3/8 GISH STERILE (MISCELLANEOUS) ×1 IMPLANT
CONN ST 3/8 X 1/2 (MISCELLANEOUS) ×1 IMPLANT
CONN Y 3/8X3/8X3/8  BEN (MISCELLANEOUS) ×6
CONN Y 3/8X3/8X3/8 BEN (MISCELLANEOUS) IMPLANT
COVER SURGICAL LIGHT HANDLE (MISCELLANEOUS) IMPLANT
DRAIN CONNECTOR BLAKE 1:1 (MISCELLANEOUS) ×1 IMPLANT
DRAPE CARDIOVASCULAR INCISE (DRAPES) ×3
DRAPE CV SPLIT W-CLR ANES SCRN (DRAPES) ×1 IMPLANT
DRAPE PERI GROIN 82X75IN TIB (DRAPES) ×1 IMPLANT
DRAPE SRG 135X102X78XABS (DRAPES) ×2 IMPLANT
DRSG COVADERM 4X14 (GAUZE/BANDAGES/DRESSINGS) ×2 IMPLANT
DRSG PAD ABDOMINAL 8X10 ST (GAUZE/BANDAGES/DRESSINGS) ×1 IMPLANT
ELECT CAUTERY BLADE 6.4 (BLADE) IMPLANT
ELECT REM PT RETURN 9FT ADLT (ELECTROSURGICAL) ×6
ELECTRODE REM PT RTRN 9FT ADLT (ELECTROSURGICAL) ×4 IMPLANT
GAUZE SPONGE 4X4 12PLY STRL (GAUZE/BANDAGES/DRESSINGS) ×6 IMPLANT
GLOVE SURG ENC MOIS LTX SZ6 (GLOVE) IMPLANT
GLOVE SURG ENC MOIS LTX SZ6.5 (GLOVE) IMPLANT
GLOVE SURG ENC MOIS LTX SZ7 (GLOVE) IMPLANT
GLOVE SURG ENC MOIS LTX SZ7.5 (GLOVE) IMPLANT
GLOVE SURG NEOP MICRO LF SZ6.5 (GLOVE) IMPLANT
GLOVE SURG SIGNA 7.5 PF LTX (GLOVE) ×6 IMPLANT
GOWN STRL REUS W/ TWL LRG LVL3 (GOWN DISPOSABLE) ×6 IMPLANT
GOWN STRL REUS W/ TWL XL LVL3 (GOWN DISPOSABLE) ×4 IMPLANT
GOWN STRL REUS W/TWL LRG LVL3 (GOWN DISPOSABLE) ×9
GOWN STRL REUS W/TWL XL LVL3 (GOWN DISPOSABLE) ×6
HEMOSTAT SURGICEL 2X14 (HEMOSTASIS) IMPLANT
INSERT FOGARTY XLG (MISCELLANEOUS) IMPLANT
KIT BASIN OR (CUSTOM PROCEDURE TRAY) ×3 IMPLANT
KIT SUCTION CATH 14FR (SUCTIONS) ×1 IMPLANT
KIT TOURNIQUET VASCULAR (KITS) ×1 IMPLANT
KIT TURNOVER KIT B (KITS) ×3 IMPLANT
LINE VENT (MISCELLANEOUS) IMPLANT
NS IRRIG 1000ML POUR BTL (IV SOLUTION) ×13 IMPLANT
PACK CHEST (CUSTOM PROCEDURE TRAY) ×3 IMPLANT
PACK E OPEN HEART (SUTURE) IMPLANT
PAD ARMBOARD 7.5X6 YLW CONV (MISCELLANEOUS) ×6 IMPLANT
PAD ELECT DEFIB RADIOL ZOLL (MISCELLANEOUS) ×3 IMPLANT
POSITIONER HEAD DONUT 9IN (MISCELLANEOUS) ×3 IMPLANT
SET CARDIOPLEGIA MPS 5001102 (MISCELLANEOUS) IMPLANT
SPONGE T-LAP 18X18 ~~LOC~~+RFID (SPONGE) IMPLANT
SPONGE T-LAP 4X18 ~~LOC~~+RFID (SPONGE) IMPLANT
SUT BONE WAX W31G (SUTURE) ×1 IMPLANT
SUT ETHIBOND 2 0 SH (SUTURE)
SUT ETHIBOND 2 0 SH 36X2 (SUTURE) IMPLANT
SUT MNCRL AB 4-0 PS2 18 (SUTURE) IMPLANT
SUT PROLENE 2 0 MH 48 (SUTURE) ×2 IMPLANT
SUT PROLENE 3 0 SH 1 (SUTURE) IMPLANT
SUT PROLENE 4 0 RB 1 (SUTURE) ×3
SUT PROLENE 4-0 RB1 .5 CRCL 36 (SUTURE) IMPLANT
SUT PROLENE 5 0 C 1 36 (SUTURE) IMPLANT
SUT PROLENE 6 0 C 1 30 (SUTURE) IMPLANT
SUT PROLENE 7 0 BV 1 (SUTURE) IMPLANT
SUT PROLENE 7 0 BV1 MDA (SUTURE) IMPLANT
SUT PROLENE 8 0 BV175 6 (SUTURE) IMPLANT
SUT SILK  1 MH (SUTURE) ×9
SUT SILK 1 MH (SUTURE) ×2 IMPLANT
SUT STEEL 6MS V (SUTURE) IMPLANT
SUT STEEL STERNAL CCS#1 18IN (SUTURE) IMPLANT
SUT STEEL SZ 6 DBL 3X14 BALL (SUTURE) IMPLANT
SUT VIC AB 1 CTX 36 (SUTURE)
SUT VIC AB 1 CTX36XBRD ANBCTR (SUTURE) ×4 IMPLANT
SUT VIC AB 2-0 CT1 36 (SUTURE) IMPLANT
SUT VIC AB 2-0 CTX 27 (SUTURE) ×4 IMPLANT
SUT VIC AB 3-0 SH 27 (SUTURE)
SUT VIC AB 3-0 SH 27X BRD (SUTURE) IMPLANT
SUT VIC AB 3-0 X1 27 (SUTURE) ×4 IMPLANT
SUT VICRYL 4-0 PS2 18IN ABS (SUTURE) IMPLANT
SYSTEM SAHARA CHEST DRAIN ATS (WOUND CARE) IMPLANT
TAPE CLOTH SURG 4X10 WHT LF (GAUZE/BANDAGES/DRESSINGS) ×1 IMPLANT
TOWEL GREEN STERILE (TOWEL DISPOSABLE) ×3 IMPLANT
TRAY CATH LUMEN 1 20CM STRL (SET/KITS/TRAYS/PACK) IMPLANT
TUBING LAP HI FLOW INSUFFLATIO (TUBING) IMPLANT
UNDERPAD 30X36 HEAVY ABSORB (UNDERPADS AND DIAPERS) ×3 IMPLANT
WATER STERILE IRR 1000ML POUR (IV SOLUTION) ×5 IMPLANT

## 2020-10-31 NOTE — Progress Notes (Signed)
  Echocardiogram Echocardiogram Transesophageal has been performed.  Veronica Swanson 10/17/2020, 3:06 PM

## 2020-10-31 NOTE — H&P (View-Only) (Signed)
1 Day Post-Op Procedure(s) (LRB): THROMBECTOMY Left  FEMORAL ARTERY with Bovine Patch. (Left) LOWER EXTREMITY ANGIOGRAM (Left) Subjective: Events of weekend noted Intubated, sedated. Did move all 4 and follow commands with last wake up assessment  Objective: Vital signs in last 24 hours: Temp:  [97.5 F (36.4 C)-98.1 F (36.7 C)] 97.9 F (36.6 C) (09/26 0730) Pulse Rate:  [30-120] 80 (09/26 0807) Cardiac Rhythm: Atrial fibrillation (09/26 0400) Resp:  [0-21] 17 (09/26 0807) BP: (98-128)/(77-99) 120/93 (09/26 0511) SpO2:  [100 %] 100 % (09/26 0400) Arterial Line BP: (78-135)/(57-104) 102/87 (09/26 0730) FiO2 (%):  [40 %] 40 % (09/26 0807) Weight:  [90.1 kg] 90.1 kg (09/26 0500)  Hemodynamic parameters for last 24 hours: PAP: (7-78)/(3-59) 25/20 CVP:  [16 mmHg-20 mmHg] 16 mmHg  Intake/Output from previous day: 09/25 0701 - 09/26 0700 In: 7355.8 [I.V.:3765.4; WUJWJ:1914; NG/GT:905.8; IV Piggyback:861.8] Out: 3091 [Urine:2206; Drains:85; Stool:110; Blood:400; Chest Tube:290] Intake/Output this shift: Total I/O In: 7.3 [Other:7.3] Out: 50 [Urine:50]  General appearance: ill appearing Neurologic: sedated Lungs: coarse BS bilaterally Extremities: warm to mid calf, cool feet Wound: large amount of blood under esmarck  Lab Results: Recent Labs    10/11/2020 0127 10/06/2020 0128 10/14/2020 0420 10/28/2020 0423  WBC 7.6  --  7.9  --   HGB 8.6*   < > 7.6* 6.5*  HCT 25.5*   < > 21.8* 19.0*  PLT 50*  --  42*  --    < > = values in this interval not displayed.   BMET:  Recent Labs    10/07/2020 1830 10/10/2020 2006 10/11/2020 0420 10/11/2020 0423  NA 143   < > 141 143  K 4.5   < > 4.8 4.8  CL 113*  --  112*  --   CO2 22  --  21*  --   GLUCOSE 184*  --  167*  --   BUN 31*  --  34*  --   CREATININE 1.97*  --  2.26*  --   CALCIUM 6.2*  --  6.3*  --    < > = values in this interval not displayed.    PT/INR:  Recent Labs    10/26/2020 0420  LABPROT 20.1*  INR 1.7*   ABG     Component Value Date/Time   PHART 7.396 10/28/2020 0423   HCO3 24.0 10/08/2020 0423   TCO2 25 10/08/2020 0423   ACIDBASEDEF 1.0 10/22/2020 0423   O2SAT 97.1 10/26/2020 0431   CBG (last 3)  Recent Labs    10/10/2020 2003 10/29/2020 0124 11/03/2020 0420  GLUCAP 176* 138* 140*    Assessment/Plan: S/P Procedure(s) (LRB): THROMBECTOMY Left  FEMORAL ARTERY with Bovine Patch. (Left) LOWER EXTREMITY ANGIOGRAM (Left) Remains critically ill NEURO- sedated now but has followed commands CV- ECMO flows 3.5L , impella 0.7  Some pulsatility. PA 27/19  On Epi at 5, norepi at 18, vasopressin 0.04  Needs washout of sternal wound  Will trial wean ECMO for possible decannulation to Impella support VASCULAR- cool feet, may just be combination of pressors and severe PAD  D/w Dr. Randie Heinz- no indication for additional vascular procedure RESP_ on ECMO, Vent 40% and 8 PEEP  CXR looks better on left RENAL- still making urine but creatinine up to 2.26 ENDO- CBG OK GI- TF on hold currently ID- WBC normal on vanco and meropenem HEME- anemia- transfuse  Thrombocytopenia- will give platelets for increased chest bleeding  On heparin for thrombectomy     LOS: 10 days  [name] 10/17/2020

## 2020-10-31 NOTE — Interval H&P Note (Signed)
History and Physical Interval Note:  Discussed plan to return to OR for reexploration, washout, possible decannulation and possible ECMO circuit replacement with her sisters. They understand the high risk nature of the procedure.  10/30/2020 1:46 PM  Lenora Boys  has presented today for surgery, with the diagnosis of ECMO.  The various methods of treatment have been discussed with the patient and family. After consideration of risks, benefits and other options for treatment, the patient has consented to  Procedure(s): REXPLORATION OF MEDIASTINUM (N/A) possible DECANNULATION FOR ECMO (EXTRACORPOREAL MEMBRANE OXYGENATION) (N/A) TRANSESOPHAGEAL ECHOCARDIOGRAM (TEE) (N/A) as a surgical intervention.  The patient's history has been reviewed, patient examined, no change in status, stable for surgery.  I have reviewed the patient's chart and labs.  Questions were answered to the patient's satisfaction.     [name]

## 2020-10-31 NOTE — Anesthesia Preprocedure Evaluation (Addendum)
Anesthesia Evaluation  Patient identified by MRN, date of birth, ID band Patient unresponsive    Reviewed: Allergy & Precautions, Patient's Chart, lab work & pertinent test results, Unable to perform ROS - Chart review onlyPreop documentation limited or incomplete due to emergent nature of procedure.  Airway Mallampati: Intubated       Dental  (+) Edentulous Lower, Edentulous Upper   Pulmonary Current Smoker and Patient abstained from smoking.,       + intubated    Cardiovascular hypertension, Pt. on medications + CAD, + Past MI, + Cardiac Stents, + CABG and +CHF   Rhythm:Irregular   S/p CABG 11-17-20, on ECMO with impella in place since procedure  '22 TEE in ICU - EF 20 to 25%. The left ventricle is small/decompressed. Global hypokinesis. There is mild left ventricular hypertrophy. Impella at 5.4 cm in the left ventricle. Right ventricular systolic function is moderately reduced. ECMO inflow cannula noted in right atrium. Mild posterior leaflet prolapse with mild MR. Impella catheter across aortic valve. Aortic valve regurgitation is trivial.     Neuro/Psych    GI/Hepatic   Endo/Other  diabetes Ca 6.2   Renal/GU Renal InsufficiencyRenal disease     Musculoskeletal   Abdominal   Peds  Hematology  (+) anemia ,  Plt 72k INR 1.5    Anesthesia Other Findings CAD  LMD  Reproductive/Obstetrics                            Anesthesia Physical  Anesthesia Plan  ASA: 4  Anesthesia Plan: General   Post-op Pain Management:    Induction: Intravenous  PONV Risk Score and Plan: 2 and Treatment may vary due to age or medical condition  Airway Management Planned: Oral ETT  Additional Equipment: TEE  Intra-op Plan:   Post-operative Plan: Post-operative intubation/ventilation  Informed Consent:     History available from chart only  Plan Discussed with: CRNA and  Anesthesiologist  Anesthesia Plan Comments:         Anesthesia Quick Evaluation

## 2020-10-31 NOTE — Brief Op Note (Addendum)
10/07/2020 - 10/31/2020  4:20 PM  PATIENT:  Veronica Swanson  75 y.o. 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  POST-OPERATIVE DIAGNOSIS:  RIGHT HEART FAILURE REQUIRING ECMO SUPPORT  PROCEDURE:  Procedure(s): REXPLORATION OF MEDIASTINUM (N/A) CIRCUIT CHANGE FOR ECMO (EXTRACORPOREAL MEMBRANE OXYGENATION) (N/A) TRANSESOPHAGEAL ECHOCARDIOGRAM (TEE) (N/A) APPLICATION OF CELL SAVER  SURGEON:  Surgeon(s) and Role:    * [name], MD - Primary  PHYSICIAN ASSISTANT: Gershon Crane PA-C  ASSISTANTS: none   ANESTHESIA:   general  EBL:  minimal  BLOOD ADMINISTERED:none  DRAINS: same , also new blake drain in mediastinum   LOCAL MEDICATIONS USED:  NONE  SPECIMEN:  No Specimen  DISPOSITION OF SPECIMEN:  N/A  COUNTS:  YES  TOURNIQUET:  * No tourniquets in log *  DICTATION: .Other Dictation: Dictation Number pending  PLAN OF CARE: Admit to inpatient   PATIENT DISPOSITION:  ICU - intubated and critically ill.   Delay start of Pharmacological VTE agent (>24hrs) due to surgical blood loss or risk of bleeding: not applicable

## 2020-10-31 NOTE — Progress Notes (Addendum)
[facility]  Subjective  - Intubated/sedated   Objective (!) 120/93 77 97.9 F (36.6 C) (!) 0 100%  Intake/Output Summary (Last 24 hours) at 10/19/2020 0754 Last data filed at 10/21/2020 0753 Gross per 24 hour  Intake 7355.78 ml  Output 3141 ml  Net 4214.78 ml    No doppler signals in B feet, legs warm to mid calf Left groin soft without hematoma   Assessment/Planning: POD # 0  1.  Left common femoral endarterectomy with bovine pericardial patch angioplasty 2.  Aortogram 3.  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  We elected for no intervention below the knee given that this all appeared chronic as well.      She would require B fem-tibial bypass which at this time she is not a candidate to undergo.  Will warm LE's.  She is supported on ECMO.  Mosetta Pigeon 10/26/2020 7:54 AM --  Laboratory Lab Results: Recent Labs    10/29/2020 0127 10/28/2020 0128 10/07/2020 0420 10/18/2020 0423  WBC 7.6  --  7.9  --   HGB 8.6*   < > 7.6* 6.5*  HCT 25.5*   < > 21.8* 19.0*  PLT 50*  --  42*  --    < > = values in this interval not displayed.   BMET Recent Labs    11/04/2020 1830 10/31/2020 2006 10/31/20 0420 10/31/20 0423  NA 143   < > 141 143  K 4.5   < > 4.8 4.8  CL 113*  --  112*  --   CO2 22  --  21*  --   GLUCOSE 184*  --  167*  --   BUN 31*  --  34*  --   CREATININE 1.97*  --  2.26*  --   CALCIUM 6.2*  --  6.3*  --    < > = values in this interval not displayed.    COAG Lab Results  Component Value Date   INR 1.7 (H) 10/31/2020   INR 1.5 (H) 10/22/2020   INR 1.4 (H) 10/17/2020   No results found for: PTT   I have interviewed and examined patient with PA and agree with assessment and  plan above.   Melyssa Signor C. Randie Heinz, MD [facility] Office: [phone] Pager: [phone]

## 2020-10-31 NOTE — Progress Notes (Signed)
Pt returned from OR and was placed on previous setting. Pt vitals are stable.

## 2020-10-31 NOTE — Progress Notes (Addendum)
Vascular Surg at bedside and unable to doppler pulses to the Right and Left legs, fem, DP.

## 2020-10-31 NOTE — Progress Notes (Signed)
1 Day Post-Op Procedure(s) (LRB): THROMBECTOMY Left  FEMORAL ARTERY with Bovine Patch. (Left) LOWER EXTREMITY ANGIOGRAM (Left) Subjective: Events of weekend noted Intubated, sedated. Did move all 4 and follow commands with last wake up assessment  Objective: Vital signs in last 24 hours: Temp:  [97.5 F (36.4 C)-98.1 F (36.7 C)] 97.9 F (36.6 C) (09/26 0730) Pulse Rate:  [30-120] 80 (09/26 0807) Cardiac Rhythm: Atrial fibrillation (09/26 0400) Resp:  [0-21] 17 (09/26 0807) BP: (98-128)/(77-99) 120/93 (09/26 0511) SpO2:  [100 %] 100 % (09/26 0400) Arterial Line BP: (78-135)/(57-104) 102/87 (09/26 0730) FiO2 (%):  [40 %] 40 % (09/26 0807) Weight:  [90.1 kg] 90.1 kg (09/26 0500)  Hemodynamic parameters for last 24 hours: PAP: (7-78)/(3-59) 25/20 CVP:  [16 mmHg-20 mmHg] 16 mmHg  Intake/Output from previous day: 09/25 0701 - 09/26 0700 In: 7355.8 [I.V.:3765.4; WUJWJ:1914; NG/GT:905.8; IV Piggyback:861.8] Out: 3091 [Urine:2206; Drains:85; Stool:110; Blood:400; Chest Tube:290] Intake/Output this shift: Total I/O In: 7.3 [Other:7.3] Out: 50 [Urine:50]  General appearance: ill appearing Neurologic: sedated Lungs: coarse BS bilaterally Extremities: warm to mid calf, cool feet Wound: large amount of blood under esmarck  Lab Results: Recent Labs    10/11/2020 0127 10/06/2020 0128 10/14/2020 0420 10/28/2020 0423  WBC 7.6  --  7.9  --   HGB 8.6*   < > 7.6* 6.5*  HCT 25.5*   < > 21.8* 19.0*  PLT 50*  --  42*  --    < > = values in this interval not displayed.   BMET:  Recent Labs    10/07/2020 1830 10/10/2020 2006 10/11/2020 0420 10/11/2020 0423  NA 143   < > 141 143  K 4.5   < > 4.8 4.8  CL 113*  --  112*  --   CO2 22  --  21*  --   GLUCOSE 184*  --  167*  --   BUN 31*  --  34*  --   CREATININE 1.97*  --  2.26*  --   CALCIUM 6.2*  --  6.3*  --    < > = values in this interval not displayed.    PT/INR:  Recent Labs    10/26/2020 0420  LABPROT 20.1*  INR 1.7*   ABG     Component Value Date/Time   PHART 7.396 10/28/2020 0423   HCO3 24.0 10/08/2020 0423   TCO2 25 10/08/2020 0423   ACIDBASEDEF 1.0 10/22/2020 0423   O2SAT 97.1 10/26/2020 0431   CBG (last 3)  Recent Labs    10/10/2020 2003 10/29/2020 0124 11/03/2020 0420  GLUCAP 176* 138* 140*    Assessment/Plan: S/P Procedure(s) (LRB): THROMBECTOMY Left  FEMORAL ARTERY with Bovine Patch. (Left) LOWER EXTREMITY ANGIOGRAM (Left) Remains critically ill NEURO- sedated now but has followed commands CV- ECMO flows 3.5L , impella 0.7  Some pulsatility. PA 27/19  On Epi at 5, norepi at 18, vasopressin 0.04  Needs washout of sternal wound  Will trial wean ECMO for possible decannulation to Impella support VASCULAR- cool feet, may just be combination of pressors and severe PAD  D/w Dr. Randie Heinz- no indication for additional vascular procedure RESP_ on ECMO, Vent 40% and 8 PEEP  CXR looks better on left RENAL- still making urine but creatinine up to 2.26 ENDO- CBG OK GI- TF on hold currently ID- WBC normal on vanco and meropenem HEME- anemia- transfuse  Thrombocytopenia- will give platelets for increased chest bleeding  On heparin for thrombectomy     LOS: 10 days  [name] 10/13/2020

## 2020-10-31 NOTE — Progress Notes (Signed)
      [address]       [place] [address]             [phone]       Unable to wean earlier, ECMO circuit changed  Dark brown output from OG tube  BP (!) 120/93   Pulse 79   Temp 97.7 F (36.5 C)   Resp (!) 0   Ht 5\' 7"  (1.702 m)   Wt 90.1 kg   SpO2 100%   BMI 31.11 kg/m   ECMO at 3.9, Impella P2 1.5   Intake/Output Summary (Last 24 hours) at November 10, 2020 1812 Last data filed at 11/10/2020 1640 Gross per 24 hour  Intake 7583.04 ml  Output 2720 ml  Net 4863.04 ml   Remains critically ill now with UGI bleed  On NO to try to decrease PVR  11/02/2020 C. Viviann Spare, MD [facility] [phone]

## 2020-10-31 NOTE — Progress Notes (Signed)
Patient ID: Veronica Swanson, female   DOB: Mar 07, 1945, 75 y.o.   MRN: [id number]    Advanced Heart Failure Rounding Note   Subjective:    - Chest washout 9/22.  - Left femoral artery occluded, left common femoral endarterectomy 9/25  On central VA ECMO with Impella at P-3 for vent  Started on low dose systemic heparin after occlusion of left common femoral artery.  Ongoing bleeding in chest, received 3 units PRBCs and 2 platelets overnight.  Transiently lost pulsatility then regained, now with MAP 100s.   On epinephrine 6, NE 18, VP 0.04.   No diuretics with bleeding.  I/Os +4.2 L with 2.2 L UOP.  On CXR, left lung ventilation improved.  FiO2 0.4 on vent with Vt around 160 cc.   In atrial fibrillation rate 80s on amiodarone 60 mg/hr.   ECMO  Speed 3100 rpm Flow 3.43 L/min pVenous -14 DeltaP 16 Sweep 6  Impella P3, flow 0.7 L/min. No alarms  Scr 1.33 -> 1.5 -> 1.7 -> 2.26 Hgb 7.6 Platelets 42 LDH 261 -> 442 -> 698 (clot in leg).    Objective:   Weight Range:  Vital Signs:   Temp:  [97.5 F (36.4 C)-98.1 F (36.7 C)] 97.9 F (36.6 C) (09/26 0730) Pulse Rate:  [30-120] 77 (09/26 0511) Resp:  [0-21] 0 (09/26 0730) BP: (98-128)/(77-99) 120/93 (09/26 0511) SpO2:  [100 %] 100 % (09/26 0400) Arterial Line BP: (78-135)/(57-104) 102/87 (09/26 0730) FiO2 (%):  [40 %] 40 % (09/26 0400) Weight:  [90.1 kg] 90.1 kg (09/26 0500) Last BM Date: 10/10/2020  Weight change: Filed Weights   10/29/20 0500 10/15/2020 0630 10/13/2020 0500  Weight: 83.8 kg 84.1 kg 90.1 kg    Intake/Output:   Intake/Output Summary (Last 24 hours) at 10/12/2020 0753 Last data filed at 10/20/2020 0753 Gross per 24 hour  Intake 7355.78 ml  Output 3141 ml  Net 4214.78 ml     Physical Exam: General: NAD, sedated.  Neck: Cannot see JVP.  Lungs: Decreased bilaterally.  CV: Chest open.  No pedal pulses by doppler.  1+ ankle edema.  Abdomen: Soft, nontender, no hepatosplenomegaly, no distention.  Skin:  Intact without lesions or rashes.  Neurologic: Woke up yesterday with purposeful movement.  Extremities: Feet cool.  HEENT: Normal.    Telemetry: AF 80s Personally reviewed   Labs: Basic Metabolic Panel: Recent Labs  Lab 10/28/20 0212 10/28/20 0436 10/28/20 2331 10/28/20 2333 10/29/20 0427 10/29/20 0429 10/29/20 1508 10/29/20 1511 10/28/2020 0403 10/29/2020 0436 10/06/2020 1830 10/22/2020 2006 10/14/2020 2226 10/17/2020 2347 10/20/2020 0128 10/20/2020 0420 10/20/2020 0423  NA 141   < > 140   < > 140   < > 142   < > 142   < > 143   < > 145 145 144 141 143  K 4.4   < > 3.6   < > 3.3*   < > 3.7   < > 4.0   < > 4.5   < > 4.6 4.5 4.7 4.8 4.8  CL 112*   < > 109  --  109  --  113*  --  113*  --  113*  --   --   --   --  112*  --   CO2 23   < > 21*  --  22  --  23  --  23  --  22  --   --   --   --  21*  --   GLUCOSE  88   < > 189*  --  182*  --  146*  --  175*  --  184*  --   --   --   --  167*  --   BUN 16   < > 20  --  20  --  21  --  25*  --  31*  --   --   --   --  34*  --   CREATININE 1.33*   < > 1.60*  --  1.53*  --  1.54*  --  1.74*  --  1.97*  --   --   --   --  2.26*  --   CALCIUM 6.7*   < > 7.1*  --  7.1*  --  6.8*  --  6.8*  --  6.2*  --   --   --   --  6.3*  --   MG 1.7  --  2.0  --  2.0  --   --   --  1.8  --   --   --   --   --   --  1.9  --    < > = values in this interval not displayed.    Liver Function Tests: Recent Labs  Lab 10/24/2020 0419 10/28/20 0212 10/29/20 0427 10/16/2020 0403 10/20/2020 0420  AST 37 43* 58* 113* 177*  ALT _0 33 40  ALKPHOS 21* 24* 28* 50 61  BILITOT 1.7* 3.0* 4.5* 3.6* 2.5*  PROT 3.8* 3.9* 4.2* 4.0* 3.3*  ALBUMIN 2.4* 2.3* 2.6* 2.0* 1.8*   No results for input(s): LIPASE, AMYLASE in the last 168 hours. No results for input(s): AMMONIA in the last 168 hours.  CBC: Recent Labs  Lab 10/20/2020 2328 10/23/2020 2330 10/29/20 1508 10/29/20 1511 10/12/2020 0403 10/22/2020 0436 10/10/2020 1830 10/22/2020 2006 10/06/2020 2347 10/18/2020 0127  10/19/2020 0128 10/09/2020 0420 10/12/2020 0423  WBC 2.7*   < > 5.0  --  5.0  --  5.5  --   --  7.6  --  7.9  --   NEUTROABS 2.1  --   --   --   --   --   --   --   --  6.5  --   --   --   HGB 7.3*   < > 7.9*   < > 9.1*   < > 9.4*   < > 7.5* 8.6* 7.5* 7.6* 6.5*  HCT 22.4*   < > 23.5*   < > 27.7*   < > 27.7*   < > 22.0* 25.5* 22.0* 21.8* 19.0*  MCV 82.7   < > 88.7  --  87.4  --  89.1  --   --  87.6  --  87.9  --   PLT 19*   < > 60*  --  41*  --  72*  --   --  50*  --  42*  --    < > = values in this interval not displayed.    Cardiac Enzymes: No results for input(s): CKTOTAL, CKMB, CKMBINDEX, TROPONINI in the last 168 hours.  BNP: BNP (last 3 results) Recent Labs    10/22/20 0954  BNP 1,154.2*    ProBNP (last 3 results) No results for input(s): PROBNP in the last 8760 hours.    Other results:  Imaging: DG CHEST PORT 1 VIEW  Result Date: 11/02/2020 CLINICAL DATA:  75 year old female on ECMO status post myocardial infarction,  heart failure, cardiogenic shock. Postoperative day zero left lower extremity revascularization from acute thrombus superimposed on peripheral vascular disease. Postoperative day 5 status post CABG. EXAM: PORTABLE CHEST 1 VIEW COMPARISON:  Portable chest 10/22/2020 and earlier. FINDINGS: Portable AP semi upright view at 0524 hours. Intubated, endotracheal tube tip at the clavicles. Enteric tube courses to the abdomen, tip not included. Right chest ECMO catheter, right upper extremity approach cardiac balloon pump and 2 additional left chest probably ECMO related cannulas in place. Right IJ approach Swan-Ganz catheter, tip now convincingly at the level of the right heart. Bilateral chest tubes in place. Indistinct mediastinal contours, and increased paratracheal and lateral mediastinal density on the right which might reflect some recent operative mediastinal hematoma. But overall stable mediastinal size since 10/29/2020. Improved left lung ventilation from yesterday but  patchy bilateral pulmonary opacity has increased since 10/29/2020, confluent in the right apex also. No pneumothorax. Indistinct pulmonary vasculature. Paucity of bowel gas. No acute osseous abnormality identified. IMPRESSION: 1. Extensive line and tube placement stable since yesterday. 2. Low lung volumes with improved left lung ventilation since yesterday but worsening bilateral ventilation compared to 10/29/2020 which may represent a combination of pulmonary edema and atelectasis. No pneumothorax identified. Electronically Signed   By: Genevie Ann M.D.   On: 10/10/2020 06:14   DG CHEST PORT 1 VIEW  Result Date: 10/23/2020 CLINICAL DATA:  On ECMO. EXAM: PORTABLE CHEST 1 VIEW COMPARISON:  10/29/2020 FINDINGS: Right IJ Swan-Ganz catheter tip is in the right ventricular outflow tract. ECMO support apparatus is again noted and appears similar to previous exam. Bilateral chest tubes are identified without pneumothorax. Feeding tube tip is in the expected location of the distal stomach or proximal duodenum. Diffuse low lung volumes. Increased opacification of the left lung. Similar appearance of right midlung atelectasis along the chest tube tract. IMPRESSION: 1. Stable support apparatus. 2. Increased opacification of the left lung. 3. Stable right midlung atelectasis. Electronically Signed   By: Kerby Moors M.D.   On: 10/26/2020 06:04   VAS [place] LOWER EXTREMITY ARTERIAL DUPLEX  Result Date: 10/17/2020 LOWER EXTREMITY ARTERIAL DUPLEX STUDY Patient Name:  Veronica Swanson  Date of Exam:   10/29/2020 Medical Rec #: [id number]         Accession #:    [id number] Date of Birth: 04-19-1945         Patient Gender: F Patient Age:   32 years Exam Location:  [facility] Procedure:      VAS [place] LOWER EXTREMITY ARTERIAL DUPLEX Referring Phys: Servando Snare --------------------------------------------------------------------------------  Indications: Peripheral artery disease, and Cold left extremity, no               Dopplerable/palpable pulses. High Risk Factors: Hypertension, hyperlipidemia, prior MI. 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  Current ABI: n/a Limitations: lines, ECMO, vent, Comparison Study: No prior left LEA Performing Technologist: Sharion Dove RVS  Examination Guidelines: A complete evaluation includes B-mode imaging, spectral Doppler, color Doppler, and power Doppler as needed of all accessible portions of each vessel. Bilateral testing is considered an integral part of a complete examination. Limited examinations for reoccurring indications may be performed as noted.   +----------+--------+-----+--------+-------------------+-----------------------+ LEFT      PSV cm/sRatioStenosisWaveform           Comments                +----------+--------+-----+--------+-------------------+-----------------------+ CFA Prox  dampened monophasic                        +----------+--------+-----+--------+-------------------+-----------------------+ DFA                            dampened monophasic                        +----------+--------+-----+--------+-------------------+-----------------------+ SFA Prox                       dampened monophasic                        +----------+--------+-----+--------+-------------------+-----------------------+ SFA Mid                                           not visualized                                                            secondary to line       +----------+--------+-----+--------+-------------------+-----------------------+ SFA Distal                     dampened monophasic                        +----------+--------+-----+--------+-------------------+-----------------------+ POP Prox                       dampened monophasic                        +----------+--------+-----+--------+-------------------+-----------------------+ POP Distal                      dampened monophasic                        +----------+--------+-----+--------+-------------------+-----------------------+ ATA Distal                     absent                                     +----------+--------+-----+--------+-------------------+-----------------------+ PTA Prox                       absent                                     +----------+--------+-----+--------+-------------------+-----------------------+ PTA Mid                        absent                                     +----------+--------+-----+--------+-------------------+-----------------------+ PTA Distal                     absent                                     +----------+--------+-----+--------+-------------------+-----------------------+  Summary: Left: Severely dampened monophasic flow noted in the common femoral, femoral, profunda, and popliteal arteries. The posterior tibial and anterior tibial arterial flow is absent.  See table(s) above for measurements and observations.    Preliminary    HYBRID OR IMAGING ([facility] ONLY)  Result Date: 10/19/2020 There is no interpretation for this exam.  This order is for images obtained during a surgical procedure.  Please See "Surgeries" Tab for more information regarding the procedure.     Medications:     Scheduled Medications:  sodium chloride   Intravenous Once   sodium chloride   Intravenous Once   sodium chloride   Intravenous Once   aspirin EC  325 mg Oral Daily   Or   aspirin  324 mg Per Tube Daily   bisacodyl  10 mg Oral Daily   Or   bisacodyl  10 mg Rectal Daily   chlorhexidine gluconate (MEDLINE KIT)  15 mL Mouth Rinse BID   Chlorhexidine Gluconate Cloth  6 each Topical Daily   docusate  100 mg Per Tube BID   insulin aspart  0-24 Units Subcutaneous Q4H   mouth rinse  15 mL Mouth Rinse 10 times per day   pantoprazole (PROTONIX) IV  40 mg Intravenous Q24H   polyethylene glycol  17 g Per Tube BID   rosuvastatin  40  mg Oral Daily   sennosides  10 mL Oral BID   sodium chloride flush  10-40 mL Intracatheter Q12H   sodium chloride flush  3 mL Intravenous Q12H    Infusions:  sodium chloride     sodium chloride Stopped (10/29/20 0611)   sodium chloride 10 mL/hr at 10/26/2020 2100   amiodarone 60 mg/hr (10/07/2020 0700)   calcium gluconate     dexmedetomidine (PRECEDEX) IV infusion Stopped (10/28/20 0748)   epinephrine 5 mcg/min (10/24/2020 0700)   feeding supplement (PIVOT 1.5 CAL) Stopped (10/15/2020 0220)   fluconazole (DIFLUCAN) IV     heparin 50 units/mL (Impella PURGE) in dextrose 5 % 1000 mL bag     heparin 200 Units/hr (10/28/2020 0700)   HYDROmorphone 4 mg/hr (11/02/2020 0700)   lactated ringers 10 mL/hr at 10/17/2020 0700   magnesium sulfate bolus IVPB     meropenem (MERREM) IV Stopped (10/15/2020 1513)   midazolam 6.5 mg/hr (10/26/2020 0700)   milrinone Stopped (10/09/2020 1318)   norepinephrine (LEVOPHED) Adult infusion 18 mcg/min (10/13/2020 0700)   vancomycin Stopped (10/29/2020 0958)   vasopressin 0.04 Units/min (10/29/2020 0712)    PRN Medications: sodium chloride, sodium chloride, sodium chloride, artificial tears, HYDROmorphone, HYDROmorphone (DILAUDID) injection, HYDROmorphone (DILAUDID) injection, midazolam, ondansetron (ZOFRAN) IV   Assessment/Plan:   1. Shock: Cardiogenic/vasoplegic/hemorrhagic post-CABG with acute lung injury. Central cannulation for VA ECMO, ascending aorta and RA.  Impella 5.5 for venting (ascending aorta). Goal to eventually wean ECMO to Impella support alone.  Currently on epinephrine 6, NE 18, VP 0.04. MAP 100s.  - Can wean pressors slowly/carefully.  - Did not tolerate volume removal with lasix, will not give today with ongoing blood loss. - Still bleeding from chest wound. Will likely need formal washout in OR with TCTS this afternoon.  - ECMO circuit stable but LDH rising, now on low dose heparin gtt with L CFA thrombus/ischemic left leg on 9/25.   - Impella at P3, flow  0.7, no alarms. Assess position under echo.  2. Acute blood loss Anemia/coagulopathy: Has had chest washout x 3.   Still bleeding from chest wound.  Received 3 units PRBCs overnight.  -  Will need formal washout in OR with TCTS today - Transfuse to keep Hgb > 8, give another unit this morning.  3. Acute hypoxemic respiratory failure: ALI with pulmonary edema and pulmonary hemorrhage.  CXR with bilateral pulmonary edema though left lung with some improvement on CXR.   - Lung protective ventilation per CCM.  - Not ready for ECMO wean yet, need lungs clearer.  - Holding off on diuresis with ongoing bleeding and need for blood products.  4. Acute on chronic systolic CHF: Ischemic cardiomyopathy.  Pre-op echo with EF 35-40%.  Intra-op echo with LV EF 25% range but good RV function.  TEE 9/22 with RV small, moderate dysfunction and LV small, EF 20-25%.    - see above 5. AKI: Creatinine up to 2.26, suspect due to hypotension yesterday.  No diuretics for now.  6. Thrombocytopenia: Due to coagulopathy. Watch closely. Platelets 42K currently.  - Give plts this morning.  7. FEN: Cortrak in place. Continue TFs 8. Neuro: Per RN report patient had purposeful movement on 9/24 9. PAF: Started 9/23. Rate controlled on amio gtt. Continue - Decrease amiodarone gtt to 30 mg/hr.  10. PAD: Patient with acute left leg ischemia on 9/25, had left CFA endarterectomy.  Unable to doppler pulses in feet but has severe chronic PAD as well.  - [facility]VS following.    CRITICAL CARE Performed by: Loralie Champagne  Total critical care time: 45 minutes  Critical care time was exclusive of separately billable procedures and treating other patients.  Critical care was necessary to treat or prevent imminent or life-threatening deterioration.  Critical care was time spent personally by me (independent of midlevel providers or residents) on the following activities: development of treatment plan with patient and/or surrogate as well as  nursing, discussions with consultants, evaluation of patient's response to treatment, examination of patient, obtaining history from patient or surrogate, ordering and performing treatments and interventions, ordering and review of laboratory studies, ordering and review of radiographic studies, pulse oximetry and re-evaluation of patient's condition.   Length of Stay: [place] 10/08/2020, 7:53 AM  Advanced Heart Failure Team Pager [phone] (M-F; 7a - 4p)  Please contact [facility] for night-coverage after hours (4p -7a ) and weekends on [email or web address]

## 2020-10-31 NOTE — Progress Notes (Signed)
Pt BP dropped significantly to A-line readings of means (35), no PAP waveform or readings, Impella placement signal pressure drop to 35/25.We lost flows on ECMO (0.6L).   Prior to this event patients PP was narrowed with <10 differences in SBP to DBP.   RN administered 1 albumin ( ), and called Dr, Cliffton Asters to make him aware of patients condition. At this time no new orders given and was told we would not be going to the OR at this time.   Vital Signs post administration of Albumin  121/99 (104) 82 HR- Afib  23/18 (20) PAP

## 2020-10-31 NOTE — Progress Notes (Signed)
Nutrition Follow-up  DOCUMENTATION CODES:   Not applicable  INTERVENTION:   Continue Tube Feeding via Cortrak:  Pivot 1.5 at 65 ml/hr Provides 146 g of protein, 2340 kcals, 1094 mL of free water  Add Pro-Source TF 45 mL QID, each packet provides 11 g of protein and 40 kcals   NUTRITION DIAGNOSIS:   Inadequate oral intake related to acute illness as evidenced by NPO status.  Being addressed via TF   GOAL:   Patient will meet greater than or equal to 90% of their needs  Progressing  MONITOR:   Vent status, Labs, Weight trends, TF tolerance  REASON FOR ASSESSMENT:   Ventilator    ASSESSMENT:   75 yo female admitted with NSTEMI and scheduled for urgent CABG, suffered cardiac arrest post induction, flash pulmonary edema, shock, required intubation and VA ECMO cannulation. PMH includes CAD, HLD, HTN  9/16 Admitted 9/20 2vCAD s/p CABG x 4, cardiac arrest post induction, flash pulmonary edema, VA ECMO cannulation, Intubated 9/22 Chest washout 9/23 Cortrak tube placed (gastric), CT with contrast reveals duodenal placement, trickle TF placed  9/25 L femoral artery occlusion, L common femoral endarterectomy  Sedated on vent support, Central VA ECMO with Impella Chest remains open-ongoing bleeding; received 3 units PRBCs and 2 platelets overnight Remains on levophed, vasopressin, epinephrine  TF on hold right now as pt returning to OR for formal washout  Per RN, tolerating Pivot 1.5 at 50 ml/hr previously  +BM, rectal tube in place  Output: UOP 2.2 L Chest tubes 20 mL JP drain 85 mL EBL 400 mL  Labs: reviewed Meds: miralax, senokot, protonix, ss novolog, colace, dulcolax   Diet Order:   Diet Order     None       EDUCATION NEEDS:   Not appropriate for education at this time  Skin:  Skin Assessment: Skin Integrity Issues: Skin Integrity Issues:: Other (Comment), Incisions Incisions: multiple chest Other: open sternum  Last BM:  10/23/20  Height:    Ht Readings from Last 1 Encounters:  10/26/20 5\' 7"  (1.702 m)    Weight:   Wt Readings from Last 1 Encounters:  10/17/2020 90.1 kg    BMI:  Body mass index is 31.11 kg/m.  Estimated Nutritional Needs:   Kcal:  2040-2380 kcals  Protein:  120-150 g  Fluid:  >/= 1.7 L    01-02-2005 MS, RDN, LDN, CNSC Registered Dietitian III Clinical Nutrition RD Pager and On-Call Pager Number Located in [place]

## 2020-10-31 NOTE — Progress Notes (Signed)
I took over case @ 1500 - ECMO was terminated at 1527.  Patient was unstable, so new circuit was connected and back on @ 1537.  Old circuit was clotted off.  Patient was transferred back to 2H22 and handed off to ICU and ECMO staff @ 1500.  Impella was still in place with flow @ P1.  Lanny Cramp, CCP

## 2020-10-31 NOTE — Progress Notes (Signed)
Patient rapidly became profoundly hypotensive w/ a marked narrow pulse pressure <10. ECMO pump started to excessively alarm due to volume deficits. Chugging noted. Flows dropped rapidly to 0.5-0.6L with a blood pressure of 48/42. PAP undetectable/no readings.  Volume deficits were corrected with 250cc albumin bolus. MD Cliffton Asters was made aware of patient clinical presentation. After albumin bolus blood pressure began to slowly reach acceptable range and flows are now greater than 3 on the ECMO pump. VSS.   Veronica Swanson, BS, RRT-ACCS, RCP

## 2020-10-31 NOTE — Progress Notes (Signed)
Per Lab, [place] Hemoglobin level read <4. Given that this does not match the CBC result for hemoglobin 7.6, will ignore as this value is not accurate and will be documented as "QR" from lab.

## 2020-10-31 NOTE — Progress Notes (Signed)
ANTICOAGULATION CONSULT NOTE Pharmacy Consult for heparin Indication:  Impella 5.5 + ECMO VA  Allergies  Allergen Reactions   Penicillins Other (See Comments)    Dizzy     Patient Measurements: Height: '5\' 7"'  (170.2 cm) Weight: 90.1 kg (198 lb 10.2 oz) IBW/kg (Calculated) : 61.6 Heparin Dosing Weight: 78.7 kg   Vital Signs: Temp: 97.7 F (36.5 C) (09/26 1245) Temp Source: Core (09/26 1715) Pulse Rate: 76 (09/26 1715)  Labs: Recent Labs    10/29/20 0427 10/29/20 0429 11/02/2020 0403 10/06/2020 0436 10/06/2020 1830 10/11/2020 2006 10/23/2020 0127 10/15/2020 0128 10/14/2020 0420 10/20/2020 0423 10/17/2020 0931 10/17/2020 0945 10/14/2020 1615  HGB 8.4*   < > 9.1*   < > 9.4*   < > 8.6*   < > 7.6*   < > 7.1* 6.8* 6.1*  HCT 25.1*   < > 27.7*   < > 27.7*   < > 25.5*   < > 21.8*   < > 21.0* 20.0* 18.0*  PLT 32*   < > 41*  --  72*  --  50*  --  42*  --   --   --   --   APTT 50*   < > 44*  --  44*  --   --   --  80*  --   --   --   --   LABPROT 18.7*   < > 16.8*  --  17.8*  --   --   --  20.1*  --   --   --   --   INR 1.6*   < > 1.4*  --  1.5*  --   --   --  1.7*  --   --   --   --   HEPARINUNFRC <0.10*  --  <0.10*  --   --   --   --   --  <0.10*  --   --   --   --   CREATININE 1.53*   < > 1.74*  --  1.97*  --   --   --  2.26*  --   --   --   --    < > = values in this interval not displayed.     Estimated Creatinine Clearance: 24.4 mL/min (A) (by C-G formula based on SCr of 2.26 mg/dL (H)).   Medical History: Past Medical History:  Diagnosis Date   Carotid artery occlusion    Coronary artery disease    Hyperlipidemia    Hypertension    Myocardial infarction ([place]) 1997    Medications:  Scheduled:   sodium chloride   Intravenous Once   sodium chloride   Intravenous Once   aspirin EC  325 mg Oral Daily   Or   aspirin  324 mg Per Tube Daily   bisacodyl  10 mg Oral Daily   Or   bisacodyl  10 mg Rectal Daily   chlorhexidine gluconate (MEDLINE KIT)  15 mL Mouth Rinse BID    Chlorhexidine Gluconate Cloth  6 each Topical Daily   docusate  100 mg Per Tube BID   feeding supplement (PROSource TF)  45 mL Per Tube QID   insulin aspart  0-24 Units Subcutaneous Q4H   mouth rinse  15 mL Mouth Rinse 10 times per day   pantoprazole (PROTONIX) IV  40 mg Intravenous Q24H   polyethylene glycol  17 g Per Tube BID   [START ON 11/05/2020] rosuvastatin  10 mg Oral Daily  sennosides  10 mL Oral BID   sodium chloride flush  10-40 mL Intracatheter Q12H   sodium chloride flush  3 mL Intravenous Q12H    Assessment: 21 yof who underwent CABG x4 complicated cardiac arrest - now has impella 5.5 and on VA-ECMO. No AC PTA.  VA-ECMO + Impella 5.5 at P-3. Purge flow 7-7.5 mL/hr (350-375 units/hr of heparin from purge solution). Purge pressure stable 500s.   Oozing from open chest increased, Hgb 9.4, plt up to 72 (received PRBC and plt today). ECMO circuit assessed for clots, fibrin strand noted in corner.   Underwent mechanical thrombectomy last night. Duplex this morning unable to get pulses in either lower extremity.   Went to OR today for re-exploration of mediastinum and circuit change. Impella set at P1 - purge flow rate 7.3 mL/hr (365 units/hr), pressures stable in 500s. Hgb low (on I-STAT) at 6-7s - planning for transfusion. Did have blood removed from stomach - oozing still but no worsening of s/sx of bleeding.  Goal of Therapy:  Heparin level <0.3 units/ml Monitor platelets by anticoagulation protocol: Yes   Plan:  Continue heparin purge solution (50 units/mL) with Impella Increase systemic heparin infusion to 300 units/hr (total heparin amount will be ~650 units/hr with systemic + purge solution) - will keep at same rate until discussed with team on AM rounds Daily heparin levels Monitor s/sx of bleeding closely  Antonietta Jewel, PharmD, [place] Pharmacist  Phone: [phone] 10/23/2020 5:46 PM  Please check AMION for all [place] phone numbers After 10:00 PM,  call [place] [phone]

## 2020-10-31 NOTE — Progress Notes (Addendum)
Unable to assess cannula integrity and patency 2200-0000 due to OR surgical procedure and sterile field. Patient flows and pressure on the pump are all stable. No alarms or chugging noted

## 2020-10-31 NOTE — Progress Notes (Signed)
Pt transported on ECMO to 2H22 from OR 16 on oxygen tank and battery powered Cardiohelp. Upon return to the ICU, Cardiohelp was plugged back into wall O2 and wall power. The transport was uneventful.

## 2020-10-31 NOTE — Progress Notes (Signed)
Patient transported on ECMO from 2H22 to OR 15.

## 2020-10-31 NOTE — Progress Notes (Signed)
Patient ID: Veronica Swanson, female   DOB: [birth year], 75 y.o.   MRN: [id number] Extracorporeal support note   ECLS support day: 5 Indication: Post-cardiotomy cardiogenic shock  Configuration: Central RA/Ao  Pump speed: 3100 rpm Pump flow: 3.4 L/min  Sweep gas: 3.5 -> 4.5 -> 5.0 -> 6  Circuit check: small amount thrombus Anticoagulant: Low dose systemic heparin Anticoagulation targets: N/A  Changes in support: None  Anticipated goals/duration of support: Wean to Impella.   Impella 5.5: P-3. Flow 0.7 L  No alarms. Waveforms ok.   Marca Ancona 10/31/2020 7:52 AM

## 2020-10-31 NOTE — Transfer of Care (Signed)
Immediate Anesthesia Transfer of Care Note  Patient: Veronica Swanson  Procedure(s) Performed: REXPLORATION OF MEDIASTINUM CIRCUIT CHANGE FOR ECMO (EXTRACORPOREAL MEMBRANE OXYGENATION) TRANSESOPHAGEAL ECHOCARDIOGRAM (TEE) APPLICATION OF CELL SAVER  Patient Location: ICU  Anesthesia Type:General  Level of Consciousness: Patient remains intubated per anesthesia plan  Airway & Oxygen Therapy: Patient remains intubated per anesthesia plan and Patient placed on Ventilator (see vital sign flow sheet for setting)  Post-op Assessment: Report given to RN and Post -op Vital signs reviewed and stable  Post vital signs: Reviewed and stable  Last Vitals:  Vitals Value Taken Time  BP    Temp    Pulse 44 10/15/2020 1701  Resp 0 10/17/2020 1701  SpO2 100 % 10/10/2020 1701  Vitals shown include unvalidated device data.  Last Pain:  Vitals:   10/16/2020 1214  TempSrc: Core  PainSc:       Patients Stated Pain Goal: 0 (10/24/20 2100)  Complications: No notable events documented.

## 2020-10-31 NOTE — Progress Notes (Signed)
ANTICOAGULATION CONSULT NOTE Pharmacy Consult for heparin Indication:  Impella 5.5 + ECMO VA  Allergies  Allergen Reactions   Penicillins Other (See Comments)    Dizzy     Patient Measurements: Height: _0  (170.2 cm) Weight: 90.1 kg (198 lb 10.2 oz) IBW/kg (Calculated) : 61.6 Heparin Dosing Weight: 78.7 kg   Vital Signs: Temp: 97.9 F (36.6 C) (09/26 0730) Temp Source: Core (09/26 0456) BP: 120/93 (09/26 0511) Pulse Rate: 77 (09/26 0511)  Labs: Recent Labs    10/29/20 0427 10/29/20 0429 10/19/2020 0403 10/22/2020 0436 10/17/2020 1830 10/15/2020 2006 10/22/2020 0127 10/24/2020 0128 10/18/2020 0420 10/07/2020 0423  HGB 8.4*   < > 9.1*   < > 9.4*   < > 8.6* 7.5* 7.6* 6.5*  HCT 25.1*   < > 27.7*   < > 27.7*   < > 25.5* 22.0* 21.8* 19.0*  PLT 32*   < > 41*  --  72*  --  50*  --  42*  --   APTT 50*   < > 44*  --  44*  --   --   --  80*  --   LABPROT 18.7*   < > 16.8*  --  17.8*  --   --   --  20.1*  --   INR 1.6*   < > 1.4*  --  1.5*  --   --   --  1.7*  --   HEPARINUNFRC <0.10*  --  <0.10*  --   --   --   --   --  <0.10*  --   CREATININE 1.53*   < > 1.74*  --  1.97*  --   --   --  2.26*  --    < > = values in this interval not displayed.     Estimated Creatinine Clearance: 24.4 mL/min (A) (by C-G formula based on SCr of 2.26 mg/dL (H)).   Medical History: Past Medical History:  Diagnosis Date   Carotid artery occlusion    Coronary artery disease    Hyperlipidemia    Hypertension    Myocardial infarction ([place]) 1997    Medications:  Scheduled:   sodium chloride   Intravenous Once   sodium chloride   Intravenous Once   sodium chloride   Intravenous Once   aspirin EC  325 mg Oral Daily   Or   aspirin  324 mg Per Tube Daily   bisacodyl  10 mg Oral Daily   Or   bisacodyl  10 mg Rectal Daily   chlorhexidine gluconate (MEDLINE KIT)  15 mL Mouth Rinse BID   Chlorhexidine Gluconate Cloth  6 each Topical Daily   docusate  100 mg Per Tube BID   insulin aspart  0-24 Units  Subcutaneous Q4H   mouth rinse  15 mL Mouth Rinse 10 times per day   pantoprazole (PROTONIX) IV  40 mg Intravenous Q24H   polyethylene glycol  17 g Per Tube BID   rosuvastatin  40 mg Oral Daily   sennosides  10 mL Oral BID   sodium chloride flush  10-40 mL Intracatheter Q12H   sodium chloride flush  3 mL Intravenous Q12H    Assessment: 8 yof who underwent CABG x4 complicated cardiac arrest - now has impella 5.5 and on VA-ECMO. No AC PTA.  VA-ECMO + Impella 5.5 at P-3. Purge flow 7-7.5 mL/hr (350-375 units/hr of heparin from purge solution). Purge pressure stable 500s.   Oozing from open chest increased, Hgb  9.4, plt up to 72 (received PRBC and plt today). ECMO circuit assessed for clots, fibrin strand noted in corner.   Underwent mechanical thrombectomy last night and systemic heparin started.   Duplex this morning unable to get pulses in either lower extremity. Heparin level < 0.1. Discussed with team need to balance bleeding and clot risk. Given low platelets at 42 and increasing oozing from open chest requiring blood products decision was made not to increase heparin despite undetectable level.    Goal of Therapy:  Heparin level <0.3 units/ml Monitor platelets by anticoagulation protocol: Yes   Plan:  Continue heparin purge solution (50 units/mL) with Impella Continue systemic heparin infusion at 200 units/hr (total heparin amount will be ~550-575 units/hr with systemic + purge solution) Daily heparin levels Monitor s/sx of bleeding closely  Cathrine Muster, PharmD PGY2 Cardiology Pharmacy Resident Phone: [phone] 10/16/2020  8:04 AM  Please check [email or web address] for unit-specific pharmacy phone numbers.

## 2020-10-31 NOTE — Progress Notes (Signed)
Patient transported on ECMO from 2H22 to OR 16 on an oxygen tank and battery power for Re-exploration of Mediastinum. Emergency supplies were brought with the circuit. Upon arrival to the operating room, the oxygen was connected to the wall source and the circuit plugged into a red power outlet. The transport was uneventful.

## 2020-10-31 NOTE — Progress Notes (Signed)
ECMO PROGRESS NOTE  NAME:  Veronica Swanson, MRN:  [id number], DOB:  May 31, 1945, LOS: 10 ADMISSION DATE:  10/23/2020, CONSULTATION DATE:  10/10/2020 REFERRING MD:  Roxan Hockey, CHIEF COMPLAINT:  VA-ECMO   HPI/course in hospital  75 year old woman who was placed on VA-ECMO support following CABGx4.  She presented 4 days ago with CCS class 4 angina and ruled in for NSTEMI. Echo showed EF of 35% with mild mitral regurgitation and an anterior wall regional wall motion abnormalities.  Coronary catheterization revealed triple-vessel disease.  LVEDP was normal at that time.  She was scheduled for urgent CABG.  This morning she began experiencing retrosternal chest pain around the time of arrival in the operating room.  She received a standard cardiac conduction with no hypotension.  However, she became progressively hypotensive and suffered a cardiac arrest likely due to a further ischemic event.  She was urgently cannulated for cardiopulmonary bypass.  She developed severe pulmonary edema and was very difficult to oxygenate.  She underwent SL bypass with reasonable targets.  She could not separate from bypass due to pulmonary edema and poor LV function.  She was converted to [facility] ECMO and a 5 5 Impella was placed and she was transferred to the ICU.  Interim history/subjective:   Lost pulse in left leg yesterday with increasing pressor requirements. Underwent left open thrombectomy.   Objective   Blood pressure (!) 120/93, pulse 73, temperature 98.1 F (36.7 C), temperature source Core, resp. rate (!) 8, height _0  (1.702 m), weight 90.1 kg, SpO2 100 %. PAP: (7-78)/(3-59) 25/20 CVP:  [16 mmHg-20 mmHg] 16 mmHg  Vent Mode: PCV FiO2 (%):  [40 %] 40 % Set Rate:  [16 bmp] 16 bmp PEEP:  [8 cmH20] 8 cmH20 Plateau Pressure:  [22 cmH20] 22 cmH20   Intake/Output Summary (Last 24 hours) at 10/16/2020 0916 Last data filed at 10/11/2020 0901 Gross per 24 hour  Intake 6612.34 ml  Output 3036 ml   Net 3576.34 ml    Filed Weights   10/29/20 0500 10/20/2020 0630 11/04/2020 0500  Weight: 83.8 kg 84.1 kg 90.1 kg    Examination: Physical Exam Constitutional:      Interventions: She is sedated and intubated.  HENT:     Head:     Comments: OGT and ETT in place.  Eyes:     Conjunctiva/sclera: Conjunctivae normal.     Right eye: Chemosis present.     Left eye: Chemosis present.  Cardiovascular:     Rate and Rhythm: Regular rhythm.     Chest Wall: PMI is displaced (Apex beat now palpable).     Pulses: Intact distal pulses.     Heart sounds: Heart sounds are distant.    No friction rub.     Comments: Generalized edema.  Increased pulsatility. Pulmonary:     Effort: She is intubated.     Breath sounds: Examination of the left-upper field reveals decreased breath sounds. Examination of the left-middle field reveals decreased breath sounds. Examination of the left-lower field reveals decreased breath sounds. Decreased breath sounds present.     Comments: Vt 70 - increased to 190 with PS 15 and PEEP to 8 Chest:     Comments: Open chest. Sero-sanguinous drainage. Continues to saturate dressing.  Abdominal:     General: Abdomen is flat and scaphoid. Bowel sounds are decreased.     Palpations: Abdomen is soft.     Tenderness: There is no abdominal  tenderness.  Genitourinary:    Comments: Foley in place with clear urine.  Skin:    General: Skin is warm.     Comments: Line sites are intact.  Neurological:     Mental Status: She is unresponsive.     Assessment & Plan:  Critically ill due to cardiogenic and vasoplegic shock following CABG complicated by perioperative cardiac arrest requiring inotropic, vasopressor mechanical cardiac support and VA ECMO  Ischemic cardiomyopathy due to recent acute non-ST elevation coronary syndrome Critically ill due to acute hypoxic respiratory failure requiring mechanical ventilation and ECMO support Acute kidney injury - improving Acute blood loss  anemia Coagulopathy secondary to cardiopulmonary bypass  - Continue vent support, VAP prevention bundle, sedation titrated to RASS -4 to -5 - Continues chest tube output. At this point seems to be more blood tinged serous discharge.   - Plan for OR washout and TEE to reassess bleeding and any recovery of cardiac function.  - Have increased ventilator settings to recruit lung and provide some external tamponading of pericardium  - Pre-post oxygenator ABG - may require circuit change when she goes to the OR. - Impella, inotrope and ECMO pump support to continue at current settings, still severely stunned heart on echo.  - Levophed/vasopressin titrating to MAP 65 for concurrent vasoplegia from ECMO circuit and arrest - significantly improved.    Daily Goals Checklist  Pain/Anxiety/Delirium protocol (if indicated): Dilaudid and Versed infusions - decrease while keeping RASS -4 VAP protocol (if indicated): Bundle in place Respiratory support goals: ECMO rest setting, keep plateau pressure less than 25, PC 15/8 Blood pressure target: MAP 70-80 DVT prophylaxis: Plan to start systemic [facility] tomorrow ? Nutritional status and feeding goals: Full dose tube feeds  GI prophylaxis: PPI Fluid status goals: allow autoregulation.  Urinary catheter: Guide hemodynamic management Central lines: Right IJ MAC introducer, left radial art line Glucose control: Insulin [place]  Mobility/therapy needs: Bedrest Antibiotic de-escalation: Vancomycin and cefuroxime for open chest Home medication reconciliation: On hold Daily labs: Per ECMO protocol Code Status: Full code Family Communication: Updated by Dr Tamala Julian 9/21 Disposition: ICU  CRITICAL CARE Performed by: Kipp Brood  Total critical care time: 45 minutes  Critical care time was exclusive of separately billable procedures and treating other patients.  Critical care was necessary to treat or prevent imminent or life-threatening deterioration.  Critical  care was time spent personally by me on the following activities: development of treatment plan with patient and/or surrogate as well as nursing, discussions with consultants, evaluation of patient's response to treatment, examination of patient, obtaining history from patient or surrogate, ordering and performing treatments and interventions, ordering and review of laboratory studies, ordering and review of radiographic studies, pulse oximetry, re-evaluation of patient's condition and participation in multidisciplinary rounds.  Kipp Brood, MD [facility] ICU Physician [place]  Pager: [phone] Mobile: [phone] After hours: [phone].

## 2020-10-31 NOTE — Progress Notes (Signed)
Patient ID: Veronica Swanson, female   DOB: [birth year], 75 y.o.   MRN: [id number]  Called to OR by Dr. Dorris Fetch.  Patient underwent repeat chest washout with good control of bleeding.   TEE done, with moderate LV hypertrophy, EF appeared to be in the 30-35% range with moderate RV dysfunction.  We slowly weaned down ECMO to off and increased Impella to P4.  Patient did not tolerate this, LV and RV became small and underfilled, MAP dropped to 40s.  Unable to correct this with volume and increasing pressors.  ECMO circuit was changed out due to thrombus in the circuit.  We had to place the patient back on ECMO and decreased Impella to P1 with small underfilled LV.  The Impella did not appear to be benefiting [place].  We additionally started NO at 30 ppm.   Plan to return to room back on [place] ECMO support and Impella at P1.  Will try again to wean off ECMO in a couple more days.    CRITICAL CARE Performed by: Marca Ancona  Total critical care time: 60 minutes  Critical care time was exclusive of separately billable procedures and treating other patients.  Critical care was necessary to treat or prevent imminent or life-threatening deterioration.  Critical care was time spent personally by me on the following activities: development of treatment plan with patient and/or surrogate as well as nursing, discussions with consultants, evaluation of patient's response to treatment, examination of patient, obtaining history from patient or surrogate, ordering and performing treatments and interventions, ordering and review of laboratory studies, ordering and review of radiographic studies, pulse oximetry and re-evaluation of patient's condition.  Marca Ancona 10/28/2020 5:23 PM

## 2020-10-31 NOTE — Progress Notes (Signed)
Critical care ECMO progress note   Assisted with management in operating room during washout.  Minimal bleeding.  Copious serosanguineous fluid aspirated from pericardium and both pleural cavities.  During this time adjusted ventilator to PRVC 340 mL (6 cc/kg) rate of 24 which achieved an acceptable minute ventilation and plateau pressure.  We then attempted to wean patient from ECMO under transesophageal echo guidance.  Reasonable RV and LV contractility with best EF approximately 30 to 40%.  Initially transfused blood and albumin attempted to lower ECMO flow.  Increased Impella speed to compensate.  Coming off ECMO altogether resulted in significant hypotension with near complete collapse of the LV cavity which was small and hypertrophied around the Impella.  A new ECMO circuit was placed in and flow was restored this resulted in improvement in RV function.  LV remained small until Impella speed dropped to P1.  This resulted in improved contractility.  Patient was started on nitric oxide to offload RV to provide flow to the LV.  Blood pressure had improved at this time.  Plan is to allow further 48 to 72 hours for the LV to recover.  CRITICAL CARE Performed by: Lynnell Catalan   Total critical care time: 60 minutes  Critical care time was exclusive of separately billable procedures and treating other patients.  Critical care was necessary to treat or prevent imminent or life-threatening deterioration.  Critical care was time spent personally by me on the following activities: development of treatment plan with patient and/or surrogate as well as nursing, discussions with consultants, evaluation of patient's response to treatment, examination of patient, obtaining history from patient or surrogate, ordering and performing treatments and interventions, ordering and review of laboratory studies, ordering and review of radiographic studies, pulse oximetry, re-evaluation of patient's condition and  participation in multidisciplinary rounds.  Lynnell Catalan, MD [facility] ICU Physician [facility] [place] [facility]  Pager: [phone] Mobile: [phone] After hours: [phone]. 6

## 2020-10-31 NOTE — Transfer of Care (Signed)
Immediate Anesthesia Transfer of Care Note  Patient: Veronica Swanson  Procedure(s) Performed: THROMBECTOMY Left  FEMORAL ARTERY with Bovine Patch. (Left: Leg Lower) LOWER EXTREMITY ANGIOGRAM (Left: Leg Lower)  Patient Location: SICU  Anesthesia Type:General  Level of Consciousness: Patient remains intubated per anesthesia plan  Airway & Oxygen Therapy: Patient placed on Ventilator (see vital sign flow sheet for setting)  Post-op Assessment: Report given to RN and Post -op Vital signs reviewed and stable  Post vital signs: Reviewed and stable  Last Vitals:  Vitals Value Taken Time  BP    Temp    Pulse    Resp    SpO2      Last Pain:  Vitals:   10/07/2020 2000  TempSrc: Core  PainSc:       Patients Stated Pain Goal: 0 (10/24/20 2100)  Complications: No notable events documented.

## 2020-10-31 NOTE — Op Note (Signed)
Patient name: Veronica Swanson MRN: [id number] DOB: Nov 03, 1945 Sex: female  10/24/2020 Pre-operative Diagnosis: Acute left lower extremity ischemia Post-operative diagnosis:  Same Surgeon:  Apolinar Junes C. Randie Heinz, MD Assistant: Nathanial Rancher, PA Procedure Performed: 1.  Left common femoral endarterectomy with bovine pericardial patch angioplasty 2.  Aortogram 3.  Left lower extremity angiogram   Indications: 75 year old female was found to have no signals at her left ankle after previously having them.  Her right ankle did have signals.  She is on ECMO.  There is been lengthy discussion with the family about her grim prognosis, they want to be aggressive about her care and we are going to proceed with left lower extremity revascularization with angiography, possible thrombectomy possible fasciotomies.  Assistant was necessary to facilitate exposure and expedite the case.  Findings: By ultrasound the common femoral artery was small and significantly calcified and did not appear patent.  For this reason I elected to cut down the common femoral artery.  There was no pulse within the common femoral artery it was completely calcified extending into the SFA and under the inguinal ligament.  There was one soft area we were able to cannulate placed a micropuncture sheath performed retrograde aortogram.  It did appear that the external leg artery was occluded common iliac artery was heavily diseased but patent.  We then placed a 5 French sheath performed aortogram which demonstrated patency however disease.  The right side, and external neck arteries were patent as was the common femoral artery and the profunda.  To be the only runoff with a diminutive SFA.  On the left side we had occluded common femoral occluded external iliac profunda did appear to reconstitute.  We performed endarterectomy of the common femoral artery.  We are able to reach high up into the inguinal ligament and remove plaque and we had very  strong inflow from the external iliac artery.  We had strong backbleeding from the profunda that there was no backbleeding from the SFA.  There was acute appearing thrombus in the profunda which was likely the underlying cause of her acute issue.  After endarterectomy we had good signal in the common femoral artery and profunda.  We performed angiogram of the left lower extremity which demonstrated occluded SFA reconstituted above the knee popliteal artery with very diminutive runoff via the anterior tibial artery and a very diminutive peroneal artery that did not runoff past the mid calf.  We elected for no intervention below the knee given that this all appeared chronic as well.    Patient has been optimized is much as possible from a vascular standpoint her left lower extremity.   Procedure:  The patient was identified in the holding area and taken to the operating where she is placed supine on the operative table general anesthesia was induced.  Patient was on ECMO she was already under significant anesthesia.  Antibiotics were up-to-date timeout was called.  Ultrasound was used to identify the common femoral artery in the left groin which was noted to be very small appeared to be occluded this was not can be cannulated endovascular.  Longitudinal incision was made along the skin.  Dissected down to the inguinal ligament.  I did have to extend the incision given the depth.  We identified the common femoral artery this was heavily calcified.  We dissected up onto the inguinal ligament it still appeared occluded.  Ultimately higher than inguinal ligament we could feel minimal pulsation.  We dissected out the profunda  and the SFA.  Patient was on heparin we ultimately gave an additional 8000 units of heparin.  We cannulated higher to the inguinal ligament with a micropuncture needle followed by wire and sheath.  We performed retrograde angiography.  We placed a Bentson centrally and a 5 French sheath followed  by Omni catheter in the aorta and performed aortogram with the above findings.  We then elected for common femoral endarterectomy.  We removed the 5 French sheath and suture-ligated the access site.  We then clamped the external iliac artery as well as the profunda and SFA.  We opened the vessel longitudinally.  There appeared to be acute thrombus in the profunda.  Common femoral itself that was completely occluded chronically with maybe only small channel throughout.  We performed extensive endarterectomy and we performed eversion of the external leg artery into a very strong inflow.  We did perform eversion of the SFA but did not get any backbleeding.  There was strong backbleeding from the profunda.  We fashioned a bovine pericardial patch and sewed in place with 5-0 Prolene suture.  Prior completion the usual flushing maneuvers were performed.  We did have significant oozing from around the suture line.  We cannulated the patch with micropuncture needle followed by wire and the sheath.  We performed left lower extremity angiogram with the above findings we elected for no further intervention.  We removed the sheath and suture-ligated the cannulation site.  The total 50 mg protamine was administered.  We obtain hemostasis in the wound and irrigated.  We closed in layers of 2-0 Vicryl in the deep tissue.  The skin was closed with staples.  Sterile dressing was applied.  She will be transferred back to the ICU in critical condition on ECMO.   EBL: 400cc  Contrast: 55cc  Meka Lewan C. Randie Heinz, MD [facility] Office: [phone] Pager: [phone]

## 2020-10-31 NOTE — OR Nursing (Signed)
3 sponges (4x18) from previous procedure used as packing removed in the OR.

## 2020-11-01 ENCOUNTER — Inpatient Hospital Stay (HOSPITAL_COMMUNITY): Payer: Medicare Other

## 2020-11-01 ENCOUNTER — Encounter (HOSPITAL_COMMUNITY): Payer: Self-pay | Admitting: Thoracic Surgery (Cardiothoracic Vascular Surgery)

## 2020-11-01 DIAGNOSIS — I5021 Acute systolic (congestive) heart failure: Secondary | ICD-10-CM

## 2020-11-01 DIAGNOSIS — I743 Embolism and thrombosis of arteries of the lower extremities: Secondary | ICD-10-CM

## 2020-11-01 DIAGNOSIS — D696 Thrombocytopenia, unspecified: Secondary | ICD-10-CM

## 2020-11-01 DIAGNOSIS — R570 Cardiogenic shock: Secondary | ICD-10-CM | POA: Diagnosis not present

## 2020-11-01 DIAGNOSIS — Z951 Presence of aortocoronary bypass graft: Secondary | ICD-10-CM | POA: Diagnosis not present

## 2020-11-01 DIAGNOSIS — Z9889 Other specified postprocedural states: Secondary | ICD-10-CM

## 2020-11-01 DIAGNOSIS — R079 Chest pain, unspecified: Secondary | ICD-10-CM | POA: Diagnosis not present

## 2020-11-01 LAB — PREPARE FRESH FROZEN PLASMA
Unit division: 0
Unit division: 0
Unit division: 0
Unit division: 0
Unit division: 0

## 2020-11-01 LAB — POCT I-STAT 7, (LYTES, BLD GAS, ICA,H+H)
Acid-base deficit: 12 mmol/L — ABNORMAL HIGH (ref 0.0–2.0)
Acid-base deficit: 3 mmol/L — ABNORMAL HIGH (ref 0.0–2.0)
Acid-base deficit: 5 mmol/L — ABNORMAL HIGH (ref 0.0–2.0)
Acid-base deficit: 8 mmol/L — ABNORMAL HIGH (ref 0.0–2.0)
Bicarbonate: 16 mmol/L — ABNORMAL LOW (ref 20.0–28.0)
Bicarbonate: 18.5 mmol/L — ABNORMAL LOW (ref 20.0–28.0)
Bicarbonate: 21.2 mmol/L (ref 20.0–28.0)
Bicarbonate: 22.7 mmol/L (ref 20.0–28.0)
Calcium, Ion: 0.99 mmol/L — ABNORMAL LOW (ref 1.15–1.40)
Calcium, Ion: 0.99 mmol/L — ABNORMAL LOW (ref 1.15–1.40)
Calcium, Ion: 1.01 mmol/L — ABNORMAL LOW (ref 1.15–1.40)
Calcium, Ion: 1.04 mmol/L — ABNORMAL LOW (ref 1.15–1.40)
HCT: 16 % — ABNORMAL LOW (ref 36.0–46.0)
HCT: 21 % — ABNORMAL LOW (ref 36.0–46.0)
HCT: 22 % — ABNORMAL LOW (ref 36.0–46.0)
HCT: 24 % — ABNORMAL LOW (ref 36.0–46.0)
Hemoglobin: 5.4 g/dL — CL (ref 12.0–15.0)
Hemoglobin: 7.1 g/dL — ABNORMAL LOW (ref 12.0–15.0)
Hemoglobin: 7.5 g/dL — ABNORMAL LOW (ref 12.0–15.0)
Hemoglobin: 8.2 g/dL — ABNORMAL LOW (ref 12.0–15.0)
O2 Saturation: 100 %
O2 Saturation: 100 %
O2 Saturation: 100 %
O2 Saturation: 100 %
Patient temperature: 36.8
Patient temperature: 36.8
Potassium: 4.8 mmol/L (ref 3.5–5.1)
Potassium: 5 mmol/L (ref 3.5–5.1)
Potassium: 6.3 mmol/L (ref 3.5–5.1)
Potassium: 6.8 mmol/L (ref 3.5–5.1)
Sodium: 139 mmol/L (ref 135–145)
Sodium: 141 mmol/L (ref 135–145)
Sodium: 143 mmol/L (ref 135–145)
Sodium: 144 mmol/L (ref 135–145)
TCO2: 17 mmol/L — ABNORMAL LOW (ref 22–32)
TCO2: 20 mmol/L — ABNORMAL LOW (ref 22–32)
TCO2: 22 mmol/L (ref 22–32)
TCO2: 24 mmol/L (ref 22–32)
pCO2 arterial: 40.5 mmHg (ref 32.0–48.0)
pCO2 arterial: 42.2 mmHg (ref 32.0–48.0)
pCO2 arterial: 43.5 mmHg (ref 32.0–48.0)
pCO2 arterial: 43.8 mmHg (ref 32.0–48.0)
pH, Arterial: 7.174 — CL (ref 7.350–7.450)
pH, Arterial: 7.251 — ABNORMAL LOW (ref 7.350–7.450)
pH, Arterial: 7.291 — ABNORMAL LOW (ref 7.350–7.450)
pH, Arterial: 7.354 (ref 7.350–7.450)
pO2, Arterial: 352 mmHg — ABNORMAL HIGH (ref 83.0–108.0)
pO2, Arterial: 373 mmHg — ABNORMAL HIGH (ref 83.0–108.0)
pO2, Arterial: 461 mmHg — ABNORMAL HIGH (ref 83.0–108.0)
pO2, Arterial: 464 mmHg — ABNORMAL HIGH (ref 83.0–108.0)

## 2020-11-01 LAB — BPAM FFP
Blood Product Expiration Date: 202209252359
Blood Product Expiration Date: 202209252359
Blood Product Expiration Date: 202209252359
Blood Product Expiration Date: 202209272359
Blood Product Expiration Date: 202210012359
Blood Product Expiration Date: 202210012359
ISSUE DATE / TIME: 202209252215
ISSUE DATE / TIME: 202209252215
ISSUE DATE / TIME: 202209252215
ISSUE DATE / TIME: 202209260644
ISSUE DATE / TIME: 202209261409
ISSUE DATE / TIME: 202209261409
Unit Type and Rh: 600
Unit Type and Rh: 6200
Unit Type and Rh: 6200
Unit Type and Rh: 6200
Unit Type and Rh: 6200
Unit Type and Rh: 6200

## 2020-11-01 LAB — PREPARE PLATELET PHERESIS
Unit division: 0
Unit division: 0

## 2020-11-01 LAB — BASIC METABOLIC PANEL
Anion gap: 8 (ref 5–15)
BUN: 44 mg/dL — ABNORMAL HIGH (ref 8–23)
CO2: 20 mmol/L — ABNORMAL LOW (ref 22–32)
Calcium: 6.4 mg/dL — CL (ref 8.9–10.3)
Chloride: 113 mmol/L — ABNORMAL HIGH (ref 98–111)
Creatinine, Ser: 2.8 mg/dL — ABNORMAL HIGH (ref 0.44–1.00)
GFR, Estimated: 17 mL/min — ABNORMAL LOW (ref >60)
Glucose, Bld: 135 mg/dL — ABNORMAL HIGH (ref 70–99)
Potassium: 5.1 mmol/L (ref 3.5–5.1)
Sodium: 141 mmol/L (ref 135–145)

## 2020-11-01 LAB — CBC
HCT: 26.3 % — ABNORMAL LOW (ref 36.0–46.0)
Hemoglobin: 9.2 g/dL — ABNORMAL LOW (ref 12.0–15.0)
MCH: 30.2 pg (ref 26.0–34.0)
MCHC: 35 g/dL (ref 30.0–36.0)
MCV: 86.2 fL (ref 80.0–100.0)
Platelets: 41 10*3/uL — ABNORMAL LOW (ref 150–400)
RBC: 3.05 MIL/uL — ABNORMAL LOW (ref 3.87–5.11)
RDW: 15.4 % (ref 11.5–15.5)
WBC: 13.3 10*3/uL — ABNORMAL HIGH (ref 4.0–10.5)
nRBC: 2.6 % — ABNORMAL HIGH (ref 0.0–0.2)

## 2020-11-01 LAB — BPAM PLATELET PHERESIS
Blood Product Expiration Date: 202209282359
Blood Product Expiration Date: 202209292359
ISSUE DATE / TIME: 202209260826
ISSUE DATE / TIME: 202209260826
Unit Type and Rh: 5100
Unit Type and Rh: 5100

## 2020-11-01 LAB — PREPARE RBC (CROSSMATCH)

## 2020-11-01 LAB — GLUCOSE, CAPILLARY
Glucose-Capillary: 123 mg/dL — ABNORMAL HIGH (ref 70–99)
Glucose-Capillary: 124 mg/dL — ABNORMAL HIGH (ref 70–99)
Glucose-Capillary: 132 mg/dL — ABNORMAL HIGH (ref 70–99)
Glucose-Capillary: 191 mg/dL — ABNORMAL HIGH (ref 70–99)
Glucose-Capillary: 99 mg/dL (ref 70–99)

## 2020-11-01 LAB — BLOOD PRODUCT ORDER (VERBAL) VERIFICATION

## 2020-11-01 LAB — CULTURE, RESPIRATORY W GRAM STAIN: Culture: NO GROWTH

## 2020-11-01 LAB — ECHOCARDIOGRAM LIMITED
Height: 67 in
Weight: 3209.9 oz

## 2020-11-01 LAB — PROTIME-INR
INR: 2.1 — ABNORMAL HIGH (ref 0.8–1.2)
Prothrombin Time: 23.1 seconds — ABNORMAL HIGH (ref 11.4–15.2)

## 2020-11-01 LAB — COOXEMETRY PANEL
Carboxyhemoglobin: 2 % — ABNORMAL HIGH (ref 0.5–1.5)
Methemoglobin: 1.4 % (ref 0.0–1.5)
O2 Saturation: 91 %
Total hemoglobin: 8.9 g/dL — ABNORMAL LOW (ref 12.0–16.0)

## 2020-11-01 LAB — MAGNESIUM: Magnesium: 2.1 mg/dL (ref 1.7–2.4)

## 2020-11-01 LAB — APTT: aPTT: 100 seconds — ABNORMAL HIGH (ref 24–36)

## 2020-11-01 LAB — PATHOLOGIST SMEAR REVIEW

## 2020-11-01 LAB — FIBRINOGEN: Fibrinogen: 309 mg/dL (ref 210–475)

## 2020-11-01 LAB — HEPARIN LEVEL (UNFRACTIONATED): Heparin Unfractionated: 0.1 IU/mL — ABNORMAL LOW (ref 0.30–0.70)

## 2020-11-01 LAB — LACTATE DEHYDROGENASE: LDH: 970 U/L — ABNORMAL HIGH (ref 98–192)

## 2020-11-01 MED ORDER — PRISMASOL BGK 4/2.5 32-4-2.5 MEQ/L REPLACEMENT SOLN: Status: DC

## 2020-11-01 MED ORDER — ACETAMINOPHEN 650 MG RE SUPP: 650.0000 mg | Freq: Four times a day (QID) | RECTAL | Status: DC | PRN

## 2020-11-01 MED ORDER — GLYCOPYRROLATE 0.2 MG/ML IJ SOLN: 0.2000 mg | INTRAMUSCULAR | Status: DC | PRN

## 2020-11-01 MED ORDER — DIPHENHYDRAMINE HCL 50 MG/ML IJ SOLN: 25.0000 mg | INTRAMUSCULAR | Status: DC | PRN

## 2020-11-01 MED ORDER — HEPARIN SODIUM (PORCINE) 1000 UNIT/ML DIALYSIS
1000.0000 [IU] | INTRAMUSCULAR | Status: DC | PRN
  Filled 2020-11-01: qty 6

## 2020-11-01 MED ORDER — INSULIN ASPART 100 UNIT/ML IV SOLN
5.0000 [IU] | Freq: Once | INTRAVENOUS | Status: AC
  Administered 2020-11-01: 5 [IU] via INTRAVENOUS

## 2020-11-01 MED ORDER — GLYCOPYRROLATE 1 MG PO TABS
1.0000 mg | ORAL_TABLET | ORAL | Status: DC | PRN
  Filled 2020-11-01: qty 1

## 2020-11-01 MED ORDER — DEXTROSE 5 % IV SOLN: INTRAVENOUS | Status: DC

## 2020-11-01 MED ORDER — ACETYLCYSTEINE 20 % IN SOLN
4.0000 mL | Freq: Once | RESPIRATORY_TRACT | Status: DC
  Filled 2020-11-01: qty 4

## 2020-11-01 MED ORDER — SODIUM BICARBONATE 8.4 % IV SOLN
INTRAVENOUS | Status: AC
  Filled 2020-11-01: qty 50

## 2020-11-01 MED ORDER — HEPARIN (PORCINE) 2000 UNITS/L FOR CRRT: INTRAVENOUS_CENTRAL | Status: DC | PRN

## 2020-11-01 MED ORDER — SODIUM BICARBONATE 8.4 % IV SOLN
INTRAVENOUS | Status: AC
  Administered 2020-11-01: 100 meq
  Filled 2020-11-01: qty 50

## 2020-11-01 MED ORDER — DEXTROSE 50 % IV SOLN
1.0000 | Freq: Once | INTRAVENOUS | Status: AC
  Administered 2020-11-01: 50 mL via INTRAVENOUS
  Filled 2020-11-01: qty 50

## 2020-11-01 MED ORDER — ACETAMINOPHEN 325 MG PO TABS: 650.0000 mg | ORAL_TABLET | Freq: Four times a day (QID) | ORAL | Status: DC | PRN

## 2020-11-01 MED ORDER — SODIUM CHLORIDE 0.9% IV SOLUTION: Freq: Once | INTRAVENOUS | Status: AC

## 2020-11-01 MED ORDER — PRISMASOL BGK 4/2.5 32-4-2.5 MEQ/L EC SOLN: Status: DC

## 2020-11-01 MED ORDER — CALCIUM GLUCONATE-NACL 2-0.675 GM/100ML-% IV SOLN
2.0000 g | Freq: Once | INTRAVENOUS | Status: AC
  Administered 2020-11-01: 2000 mg via INTRAVENOUS
  Filled 2020-11-01: qty 100

## 2020-11-01 MED ORDER — CALCIUM GLUCONATE-NACL 1-0.675 GM/50ML-% IV SOLN
1.0000 g | Freq: Once | INTRAVENOUS | Status: AC
  Administered 2020-11-01: 1000 mg via INTRAVENOUS
  Filled 2020-11-01: qty 50

## 2020-11-01 MED ORDER — ACETYLCYSTEINE 10 % IN SOLN
2.0000 mL | Freq: Once | RESPIRATORY_TRACT | Status: DC
  Filled 2020-11-01 (×2): qty 2

## 2020-11-02 LAB — TYPE AND SCREEN
ABO/RH(D): A POS
Antibody Screen: NEGATIVE
Unit division: 0
Unit division: 0
Unit division: 0
Unit division: 0
Unit division: 0
Unit division: 0
Unit division: 0
Unit division: 0
Unit division: 0
Unit division: 0
Unit division: 0
Unit division: 0
Unit division: 0

## 2020-11-02 LAB — BPAM RBC
Blood Product Expiration Date: 202210012359
Blood Product Expiration Date: 202210182359
Blood Product Expiration Date: 202210202359
Blood Product Expiration Date: 202210252359
Blood Product Expiration Date: 202210252359
Blood Product Expiration Date: 202210252359
Blood Product Expiration Date: 202210252359
Blood Product Expiration Date: 202210252359
Blood Product Expiration Date: 202210252359
Blood Product Expiration Date: 202210262359
Blood Product Expiration Date: 202210262359
Blood Product Expiration Date: 202210262359
Blood Product Expiration Date: 202210262359
ISSUE DATE / TIME: 202209260448
ISSUE DATE / TIME: 202209260827
ISSUE DATE / TIME: 202209261059
ISSUE DATE / TIME: 202209261409
ISSUE DATE / TIME: 202209261409
ISSUE DATE / TIME: 202209261708
ISSUE DATE / TIME: 202209261708
ISSUE DATE / TIME: 202209261708
ISSUE DATE / TIME: 202209271046
ISSUE DATE / TIME: 202209271046
ISSUE DATE / TIME: 202209271046
ISSUE DATE / TIME: 202209271547
ISSUE DATE / TIME: 202209280833
Unit Type and Rh: 6200
Unit Type and Rh: 6200
Unit Type and Rh: 6200
Unit Type and Rh: 6200
Unit Type and Rh: 6200
Unit Type and Rh: 6200
Unit Type and Rh: 6200
Unit Type and Rh: 6200
Unit Type and Rh: 6200
Unit Type and Rh: 6200
Unit Type and Rh: 6200
Unit Type and Rh: 6200
Unit Type and Rh: 6200

## 2020-11-03 MED FILL — Sodium Chloride IV Soln 0.9%: INTRAVENOUS | Qty: 3000 | Status: AC

## 2020-11-03 MED FILL — Heparin Sodium (Porcine) Inj 1000 Unit/ML: INTRAMUSCULAR | Qty: 30 | Status: AC

## 2020-11-04 LAB — CULTURE, BLOOD (ROUTINE X 2)
Culture: NO GROWTH
Special Requests: ADEQUATE

## 2020-11-05 LAB — CULTURE, BLOOD (ROUTINE X 2): Culture: NO GROWTH

## 2020-11-05 NOTE — Consult Note (Addendum)
Reason for Consult: Renal failure Referring Physician:  Dr. 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 UOP trending down to oliguric status and unable to wean off ECMO with weights up at least 4kg during this hospitalization. - She did not tolerate Lasix challenge with hypotension requiring immediate isotonic fluid resuscitation. Will initiate CRRT and hopefully patient will tolerate to help with volume management; will start at 35m net UF and see if she tolerates prior to increasing. - Appreciate CCM agreeing to place catheter; will be at a different site (RIJ) and not single circuit pre-oxygenator.  - Not convinced she will be able to tolerate CRRT; very tenuous at best. CASHD s/p 4V CABG and currently in cardiogenic shock with acute lung injury as well on [facility] ECMO.  CHF with EF ~25%; preop 35-40% Acute lung injury with whiteout of right lung on CXR. Coagulopathy with thrombocytopenia. Afib on amio + rate control.    HPI: Veronica Swanson an 75y.o. female CASHD w/ h/o PCI prior to this hospitalization, Carotid disease, HTN, HLD, borderline DM, h/o MI presenting with chest pain radiating to the neck and back, pain in chest described as pressure like. She was diagnosed with a NSTEMI with cath showing severe CASHD with left main disease and a totally occluded LAD.  Patient had a CABG x4 vessels + ECMO + Impella on 10/23/2020 with washout on 9/22. Patient required multiple blood products on 9/20 as well as large hemoptysis. Patient required a lt common fem endarterectomy with path on 9/26 +  aortogram + arteriogram of the left lower extremity by vascular.  ECMO changed out on 9/26 because of thrombus in the circuit and on Impella as well. ECMO unable to be weaned off and NO started to decrease  PVR. Patient has had multiple hypotensive episodes and was on Amiodarone, Epinephrine, Levophed and Vasopressin at time of consultation. UOP  was  very good till 48hrs ago and has then trended down to oliguric range. Hospital course also complicated with GIB.  Patient is +4420 ml during this hospitalization.  ROS Pertinent items are noted in HPI.  Chemistry and CBC: Creatinine, Ser  Date/Time Value Ref Range Status  02022/11/500:34 AM 2.80 (H) 0.44 - 1.00 mg/dL Final  10/23/2020 09:32 PM 2.67 (H) 0.44 - 1.00 mg/dL Final  10/15/2020 04:20 AM 2.26 (H) 0.44 - 1.00 mg/dL Final  10/14/2020 06:30 PM 1.97 (H) 0.44 - 1.00 mg/dL Final  10/12/2020 04:03 AM 1.74 (H) 0.44 - 1.00 mg/dL Final  10/29/2020 03:08 PM 1.54 (H) 0.44 - 1.00 mg/dL Final  10/29/2020 04:27 AM 1.53 (H) 0.44 - 1.00 mg/dL Final  10/28/2020 11:31 PM 1.60 (H) 0.44 - 1.00 mg/dL Final  10/28/2020 03:56 PM 1.53 (H) 0.44 - 1.00 mg/dL Final  10/28/2020 02:12 AM 1.33 (H) 0.44 - 1.00 mg/dL Final  10/10/2020 07:36 PM 1.34 (H) 0.44 - 1.00 mg/dL Final    Comment:    DELTA CHECK NOTED  10/08/2020 04:24 PM 1.32 (H) 0.44 - 1.00 mg/dL Final  10/18/2020 04:19 AM 1.25 (H) 0.44 - 1.00 mg/dL Final  10/26/2020 11:58 PM 1.26 (H) 0.44 - 1.00 mg/dL Final    Comment:    DELTA CHECK NOTED  10/26/2020 07:54 PM 1.29 (H) 0.44 - 1.00 mg/dL Final  10/26/2020 01:03 PM 1.19 (H) 0.44 - 1.00 mg/dL Final  10/26/2020 08:03 AM 1.20 (  H) 0.44 - 1.00 mg/dL Final  10/26/2020 04:09 AM 1.39 (H) 0.44 - 1.00 mg/dL Final  10/20/2020 11:28 PM 1.44 (H) 0.44 - 1.00 mg/dL Final  10/13/2020 07:58 PM 1.40 (H) 0.44 - 1.00 mg/dL Final  10/22/2020 03:45 PM 0.80 0.44 - 1.00 mg/dL Final  10/23/2020 03:34 PM 0.80 0.44 - 1.00 mg/dL Final  10/09/2020 03:23 PM 0.80 0.44 - 1.00 mg/dL Final  11/02/2020 02:30 PM 0.70 0.44 - 1.00 mg/dL Final  10/23/2020 01:33 PM 1.00 0.44 - 1.00 mg/dL Final  10/06/2020 12:04 PM 1.20 (H) 0.44 - 1.00 mg/dL Final  10/19/2020 11:15 AM 1.10 (H) 0.44 - 1.00  mg/dL Final  10/23/2020 10:21 AM 1.00 0.44 - 1.00 mg/dL Final  10/15/2020 09:50 AM 1.10 (H) 0.44 - 1.00 mg/dL Final  10/14/2020 09:24 AM 1.10 (H) 0.44 - 1.00 mg/dL Final  10/23/2020 08:42 AM 1.00 0.44 - 1.00 mg/dL Final  11/02/2020 08:08 AM 1.40 (H) 0.44 - 1.00 mg/dL Final  11/02/2020 03:05 AM 1.66 (H) 0.44 - 1.00 mg/dL Final  10/24/2020 02:33 AM 1.52 (H) 0.44 - 1.00 mg/dL Final  10/23/2020 03:51 AM 1.33 (H) 0.44 - 1.00 mg/dL Final  10/22/2020 05:59 PM 1.34 (H) 0.44 - 1.00 mg/dL Final  10/28/2020 01:33 PM 1.38 (H) 0.44 - 1.00 mg/dL Final  12/07/2019 01:16 PM 1.27 (H) 0.57 - 1.00 mg/dL Final  07/14/2018 11:27 AM 1.40 (H) 0.57 - 1.00 mg/dL Final  12/09/2017 11:42 AM 1.38 (H) 0.57 - 1.00 mg/dL Final  09/22/2007 09:53 PM 0.80 0.40 - 1.20 mg/dL Final  05/13/2007 08:18 PM 0.74 0.40 - 1.20 mg/dL Final  01/09/2007 04:32 AM 0.7  Final   Recent Labs  Lab 10/29/20 0427 10/29/20 0429 10/29/20 1508 10/29/20 1511 10/23/2020 0403 10/29/2020 0436 10/10/2020 1830 10/08/2020 2006 10/18/2020 0420 10/24/2020 0423 10/29/2020 1615 11/04/2020 1717 10/23/2020 2132 10/11/2020 2133 11-18-2020 0024 11/18/2020 0154 11-18-20 0234  NA 140   < > 142   < > 142   < > 143   < > 141   < > 145 144 142 143 144 143 141  K 3.3*   < > 3.7   < > 4.0   < > 4.5   < > 4.8   < > 4.5 4.4 4.6 4.5 4.8 5.0 5.1  CL 109  --  113*  --  113*  --  113*  --  112*  --   --   --  114*  --   --   --  113*  CO2 22  --  23  --  23  --  22  --  21*  --   --   --  20*  --   --   --  20*  GLUCOSE 182*  --  146*  --  175*  --  184*  --  167*  --   --   --  132*  --   --   --  135*  BUN 20  --  21  --  25*  --  31*  --  34*  --   --   --  41*  --   --   --  44*  CREATININE 1.53*  --  1.54*  --  1.74*  --  1.97*  --  2.26*  --   --   --  2.67*  --   --   --  2.80*  CALCIUM 7.1*  --  6.8*  --  6.8*  --  6.2*  --  6.3*  --   --   --  6.1*  --   --   --  6.4*  PHOS  --   --   --   --   --   --   --   --  3.3  --   --   --   --   --   --   --   --    < > = values  in this interval not displayed.   Recent Labs  Lab 11/02/2020 2328 11/04/2020 2330 11/02/2020 0127 10/15/2020 0128 10/14/2020 0420 10/11/2020 0423 10/26/2020 2132 10/18/2020 2133 11/15/2020 0024 Nov 15, 2020 0154 15-Nov-2020 0234  WBC 2.7*   < > 7.6  --  7.9  --  9.7  --   --   --  13.3*  NEUTROABS 2.1  --  6.5  --   --   --   --   --   --   --   --   HGB 7.3*   < > 8.6*   < > 7.6*   < > 8.7* 7.1* 8.2* 7.5* 9.2*  HCT 22.4*   < > 25.5*   < > 21.8*   < > 25.5* 21.0* 24.0* 22.0* 26.3*  MCV 82.7   < > 87.6  --  87.9  --  87.9  --   --   --  86.2  PLT 19*   < > 50*  --  42*  --  52*  --   --   --  41*   < > = values in this interval not displayed.   Liver Function Tests: Recent Labs  Lab 10/29/20 0427 10/13/2020 0403 10/14/2020 0420  AST 58* 113* 177*  ALT 21 33 40  ALKPHOS 28* 50 61  BILITOT 4.5* 3.6* 2.5*  PROT 4.2* 4.0* 3.3*  ALBUMIN 2.6* 2.0* 1.8*   No results for input(s): LIPASE, AMYLASE in the last 168 hours. No results for input(s): AMMONIA in the last 168 hours. Cardiac Enzymes: No results for input(s): CKTOTAL, CKMB, CKMBINDEX, TROPONINI in the last 168 hours. Iron Studies: No results for input(s): IRON, TIBC, TRANSFERRIN, FERRITIN in the last 72 hours. PT/INR: _0 (inr:5)  Xrays/Other Studies: ) Results for orders placed or performed during the hospital encounter of 10/15/2020 (from the past 48 hour(s))  Glucose, capillary     Status: Abnormal   Collection Time: 10/23/2020 11:22 AM  Result Value Ref Range   Glucose-Capillary 206 (H) 70 - 99 mg/dL    Comment: Glucose reference range applies only to samples taken after fasting for at least 8 hours.  I-STAT 7, (LYTES, BLD GAS, ICA, H+H)     Status: Abnormal   Collection Time: 10/20/2020  1:20 PM  Result Value Ref Range   pH, Arterial 7.323 (L) 7.350 - 7.450   pCO2 arterial 47.0 32.0 - 48.0 mmHg   pO2, Arterial 306 (H) 83.0 - 108.0 mmHg   Bicarbonate 24.5 20.0 - 28.0 mmol/L   TCO2 26 22 - 32 mmol/L   O2 Saturation 100.0 %    Acid-base deficit 2.0 0.0 - 2.0 mmol/L   Sodium 144 135 - 145 mmol/L   Potassium 3.8 3.5 - 5.1 mmol/L   Calcium, Ion 1.03 (L) 1.15 - 1.40 mmol/L   HCT 21.0 (L) 36.0 - 46.0 %   Hemoglobin 7.1 (L) 12.0 - 15.0 g/dL   Patient temperature 36.7 C    Sample type ARTERIAL   Prepare RBC (crossmatch)     Status: None  Collection Time: 10/09/2020  1:23 PM  Result Value Ref Range   Order Confirmation      ORDER PROCESSED BY BLOOD BANK 4units already allocated. Performed at [facility], [place] [address] [place], [place] [address]   Prepare Pheresed Platelets     Status: None   Collection Time: 10/31/2020  1:24 PM  Result Value Ref Range   Unit Number [id number]    Blood Component Type PLTP2 PSORALEN TREATED    Unit division 00    Status of Unit ISSUED,FINAL    Transfusion Status      OK TO TRANSFUSE Performed at [facility] [address], [place], [place] [address]   Culture, Respiratory w Gram Stain     Status: None   Collection Time: 10/17/2020  2:10 PM   Specimen: Tracheal Aspirate; Respiratory  Result Value Ref Range   Specimen Description TRACHEAL ASPIRATE    Special Requests NONE    Gram Stain      FEW SQUAMOUS EPITHELIAL CELLS PRESENT MODERATE WBC PRESENT,BOTH PMN AND MONONUCLEAR NO ORGANISMS SEEN    Culture      NO GROWTH 2 DAYS Performed at [facility], [address] [place], [place] [address]    Report Status November 26, 2020 FINAL   I-STAT 7, (LYTES, BLD GAS, ICA, H+H)     Status: Abnormal   Collection Time: 10/24/2020  2:47 PM  Result Value Ref Range   pH, Arterial 7.353 7.350 - 7.450   pCO2 arterial 44.1 32.0 - 48.0 mmHg   pO2, Arterial 268 (H) 83.0 - 108.0 mmHg   Bicarbonate 24.6 20.0 - 28.0 mmol/L   TCO2 26 22 - 32 mmol/L   O2 Saturation 100.0 %   Acid-base deficit 1.0 0.0 - 2.0 mmol/L   Sodium 144 135 - 145 mmol/L   Potassium 4.1 3.5 - 5.1 mmol/L   Calcium, Ion 1.03 (L) 1.15 - 1.40 mmol/L   HCT 23.0 (L) 36.0 - 46.0 %   Hemoglobin 7.8 (L) 12.0 -  15.0 g/dL   Patient temperature 36.6 C    Sample type ARTERIAL   Glucose, capillary     Status: Abnormal   Collection Time: 10/17/2020  4:59 PM  Result Value Ref Range   Glucose-Capillary 158 (H) 70 - 99 mg/dL    Comment: Glucose reference range applies only to samples taken after fasting for at least 8 hours.  Type and screen [place]     Status: None (Preliminary result)   Collection Time: 10/28/2020  6:30 PM  Result Value Ref Range   ABO/RH(D) A POS    Antibody Screen NEG    Sample Expiration 11/02/2020,2359    Unit Number [id number]    Blood Component Type RED CELLS,LR    Unit division 00    Status of Unit REL FROM [facility]    Transfusion Status OK TO TRANSFUSE    Crossmatch Result      Compatible Performed at [facility], [place] [address], [place], [place] [address]    Unit Number [id number]    Blood Component Type RED CELLS,LR    Unit division 00    Status of Unit ISSUED    Transfusion Status OK TO TRANSFUSE    Crossmatch Result Compatible    Unit Number [id number]    Blood Component Type RED CELLS,LR    Unit division 00    Status of Unit ISSUED    Transfusion Status OK TO TRANSFUSE    Crossmatch  Result Compatible    Unit Number [id number]    Blood Component Type RED CELLS,LR    Unit division 00    Status of Unit ISSUED    Transfusion Status OK TO TRANSFUSE    Crossmatch Result Compatible    Unit Number [id number]    Blood Component Type RED CELLS,LR    Unit division 00    Status of Unit ISSUED    Transfusion Status OK TO TRANSFUSE    Crossmatch Result Compatible    Unit Number [id number]    Blood Component Type RED CELLS,LR    Unit division 00    Status of Unit ISSUED    Transfusion Status OK TO TRANSFUSE    Crossmatch Result Compatible    Unit Number [id number]    Blood Component Type RBC LR PHER2    Unit division 00    Status of Unit ISSUED    Transfusion Status OK TO TRANSFUSE    Crossmatch Result Compatible     Unit Number [id number]    Blood Component Type RED CELLS,LR    Unit division 00    Status of Unit ISSUED    Transfusion Status OK TO TRANSFUSE    Crossmatch Result Compatible    Unit Number [id number]    Blood Component Type RED CELLS,LR    Unit division 00    Status of Unit ISSUED    Transfusion Status OK TO TRANSFUSE    Crossmatch Result Compatible    Unit Number [id number]    Blood Component Type RED CELLS,LR    Unit division 00    Status of Unit ALLOCATED    Transfusion Status OK TO TRANSFUSE    Crossmatch Result Compatible    Unit Number [id number]    Blood Component Type RED CELLS,LR    Unit division 00    Status of Unit ALLOCATED    Transfusion Status OK TO TRANSFUSE    Crossmatch Result Compatible    Unit Number [id number]    Blood Component Type RED CELLS,LR    Unit division 00    Status of Unit ALLOCATED    Transfusion Status OK TO TRANSFUSE    Crossmatch Result Compatible    Unit Number [id number]    Blood Component Type RED CELLS,LR    Unit division 00    Status of Unit ALLOCATED    Transfusion Status OK TO TRANSFUSE    Crossmatch Result Compatible   APTT     Status: Abnormal   Collection Time: 10/08/2020  6:30 PM  Result Value Ref Range   aPTT 44 (H) 24 - 36 seconds    Comment:        IF BASELINE aPTT IS ELEVATED, SUGGEST PATIENT RISK ASSESSMENT BE USED TO DETERMINE APPROPRIATE ANTICOAGULANT THERAPY. Performed at [facility], [place] [address], [place], [place] [address]   Basic metabolic panel     Status: Abnormal   Collection Time: 10/09/2020  6:30 PM  Result Value Ref Range   Sodium 143 135 - 145 mmol/L   Potassium 4.5 3.5 - 5.1 mmol/L   Chloride 113 (H) 98 - 111 mmol/L   CO2 22 22 - 32 mmol/L   Glucose, Bld 184 (H) 70 - 99 mg/dL    Comment: Glucose reference range applies only to samples taken after fasting for at least 8 hours.   BUN 31 (H) 8 - 23 mg/dL   Creatinine, Ser 1.97 (H) 0.44 - 1.00 mg/dL   Calcium 6.2 (LL) 8.9  - 10.3 mg/dL  Comment: CRITICAL RESULT CALLED TO, READ BACK BY AND VERIFIED WITH: CAIN A,RN 10/26/2020 2013 WAYK    GFR, Estimated 26 (L) >60 mL/min    Comment: (NOTE) Calculated using the CKD-EPI Creatinine Equation (2021)    Anion gap 8 5 - 15    Comment: Performed at [facility], [place] [address], [place], [place] [address]  CBC     Status: Abnormal   Collection Time: 10/26/2020  6:30 PM  Result Value Ref Range   WBC 5.5 4.0 - 10.5 K/uL   RBC 3.11 (L) 3.87 - 5.11 MIL/uL   Hemoglobin 9.4 (L) 12.0 - 15.0 g/dL   HCT 27.7 (L) 36.0 - 46.0 %   MCV 89.1 80.0 - 100.0 fL   MCH 30.2 26.0 - 34.0 pg   MCHC 33.9 30.0 - 36.0 g/dL   RDW 17.0 (H) 11.5 - 15.5 %   Platelets 72 (L) 150 - 400 K/uL    Comment: Immature Platelet Fraction may be clinically indicated, consider ordering this additional test [id number] CONSISTENT WITH PREVIOUS RESULT REPEATED TO VERIFY    nRBC 2.0 (H) 0.0 - 0.2 %    Comment: Performed at [facility], [place] [address], [place], [place] [address]  Fibrinogen     Status: None   Collection Time: 10/14/2020  6:30 PM  Result Value Ref Range   Fibrinogen 466 210 - 475 mg/dL    Comment: (NOTE) Fibrinogen results may be underestimated in patients receiving thrombolytic therapy. Performed at [facility], [place] [address], [place], [place] [address]   Protime-INR     Status: Abnormal   Collection Time: 10/18/2020  6:30 PM  Result Value Ref Range   Prothrombin Time 17.8 (H) 11.4 - 15.2 seconds   INR 1.5 (H) 0.8 - 1.2    Comment: (NOTE) INR goal varies based on device and disease states. Performed at [facility], [place] [address], [place], [place] [address]   Culture, blood (routine x 2)     Status: None (Preliminary result)   Collection Time: 10/08/2020  7:45 PM   Specimen: BLOOD  Result Value Ref Range   Specimen Description BLOOD SITE NOT SPECIFIED    Special Requests      BOTTLES DRAWN AEROBIC AND ANAEROBIC Blood Culture adequate volume   Culture       NO GROWTH < 24 HOURS Performed at [facility], [place] [address], [place], [place] [address]    Report Status PENDING   Glucose, capillary     Status: Abnormal   Collection Time: 10/10/2020  8:03 PM  Result Value Ref Range   Glucose-Capillary 176 (H) 70 - 99 mg/dL    Comment: Glucose reference range applies only to samples taken after fasting for at least 8 hours.  I-STAT 7, (LYTES, BLD GAS, ICA, H+H)     Status: Abnormal   Collection Time: 10/13/2020  8:06 PM  Result Value Ref Range   pH, Arterial 7.365 7.350 - 7.450   pCO2 arterial 45.8 32.0 - 48.0 mmHg   pO2, Arterial 393 (H) 83.0 - 108.0 mmHg   Bicarbonate 26.3 20.0 - 28.0 mmol/L   TCO2 28 22 - 32 mmol/L   O2 Saturation 100.0 %   Acid-Base Excess 1.0 0.0 - 2.0 mmol/L   Sodium 144 135 - 145 mmol/L   Potassium 4.6 3.5 - 5.1 mmol/L   Calcium, Ion 0.97 (L) 1.15 - 1.40 mmol/L   HCT 23.0 (L) 36.0 - 46.0 %  Hemoglobin 7.8 (L) 12.0 - 15.0 g/dL   Patient temperature 36.7 C    Sample type ARTERIAL   Prepare fresh frozen plasma     Status: None (Preliminary result)   Collection Time: 11/02/2020 10:13 PM  Result Value Ref Range   Unit Number [id number]    Blood Component Type THAWED PLASMA    Unit division 00    Status of Unit REL FROM [facility]    Transfusion Status OK TO TRANSFUSE    Unit Number [id number]    Blood Component Type THAWED PLASMA    Unit division 00    Status of Unit REL FROM [facility]    Transfusion Status OK TO TRANSFUSE    Unit Number [id number]    Blood Component Type THAWED PLASMA    Unit division 00    Status of Unit REL FROM [facility]    Transfusion Status OK TO TRANSFUSE    Unit Number [id number]    Blood Component Type THW PLS APHR    Unit division B0    Status of Unit REL FROM [facility]    Transfusion Status OK TO TRANSFUSE    Unit Number [id number]    Blood Component Type THW PLS APHR    Unit division 00    Status of Unit ISSUED    Transfusion Status OK TO TRANSFUSE    Unit Number  [id number]    Blood Component Type THAWED PLASMA    Unit division 00    Status of Unit ISSUED    Transfusion Status      OK TO TRANSFUSE Performed at [facility] [address], [place], [place] [address]   I-STAT 7, (LYTES, BLD GAS, ICA, H+H)     Status: Abnormal   Collection Time: 10/09/2020 10:26 PM  Result Value Ref Range   pH, Arterial 7.360 7.350 - 7.450   pCO2 arterial 41.8 32.0 - 48.0 mmHg   pO2, Arterial 362 (H) 83.0 - 108.0 mmHg   Bicarbonate 23.8 20.0 - 28.0 mmol/L   TCO2 25 22 - 32 mmol/L   O2 Saturation 100.0 %   Acid-base deficit 2.0 0.0 - 2.0 mmol/L   Sodium 145 135 - 145 mmol/L   Potassium 4.6 3.5 - 5.1 mmol/L   Calcium, Ion 1.06 (L) 1.15 - 1.40 mmol/L   HCT 24.0 (L) 36.0 - 46.0 %   Hemoglobin 8.2 (L) 12.0 - 15.0 g/dL   Patient temperature 36.1 C    Sample type ARTERIAL   POCT Activated clotting time     Status: None   Collection Time: 11/04/2020 11:24 PM  Result Value Ref Range   Activated Clotting Time 155 seconds    Comment: Reference range 74-137 seconds for patients not on anticoagulant therapy.  I-STAT 7, (LYTES, BLD GAS, ICA, H+H)     Status: Abnormal   Collection Time: 10/07/2020 11:47 PM  Result Value Ref Range   pH, Arterial 7.400 7.350 - 7.450   pCO2 arterial 36.8 32.0 - 48.0 mmHg   pO2, Arterial 406 (H) 83.0 - 108.0 mmHg   Bicarbonate 23.0 20.0 - 28.0 mmol/L   TCO2 24 22 - 32 mmol/L   O2 Saturation 100.0 %   Acid-base deficit 2.0 0.0 - 2.0 mmol/L   Sodium 145 135 - 145 mmol/L   Potassium 4.5 3.5 - 5.1 mmol/L   Calcium, Ion 1.03 (L) 1.15 - 1.40 mmol/L   HCT 22.0 (L) 36.0 - 46.0 %   Hemoglobin 7.5 (L) 12.0 - 15.0 g/dL   Patient  temperature 36.3 C    Sample type ARTERIAL   Glucose, capillary     Status: Abnormal   Collection Time: 11/03/2020  1:24 AM  Result Value Ref Range   Glucose-Capillary 138 (H) 70 - 99 mg/dL    Comment: Glucose reference range applies only to samples taken after fasting for at least 8 hours.  CBC with  Differential/Platelet     Status: Abnormal   Collection Time: 10/22/2020  1:27 AM  Result Value Ref Range   WBC 7.6 4.0 - 10.5 K/uL   RBC 2.91 (L) 3.87 - 5.11 MIL/uL   Hemoglobin 8.6 (L) 12.0 - 15.0 g/dL   HCT 25.5 (L) 36.0 - 46.0 %   MCV 87.6 80.0 - 100.0 fL   MCH 29.6 26.0 - 34.0 pg   MCHC 33.7 30.0 - 36.0 g/dL   RDW 16.6 (H) 11.5 - 15.5 %   Platelets 50 (L) 150 - 400 K/uL    Comment: Immature Platelet Fraction may be clinically indicated, consider ordering this additional test [id number] CONSISTENT WITH PREVIOUS RESULT REPEATED TO VERIFY    nRBC 2.8 (H) 0.0 - 0.2 %   Neutrophils Relative % 85 %   Neutro Abs 6.5 1.7 - 7.7 K/uL   Lymphocytes Relative 5 %   Lymphs Abs 0.3 (L) 0.7 - 4.0 K/uL   Monocytes Relative 7 %   Monocytes Absolute 0.5 0.1 - 1.0 K/uL   Eosinophils Relative 1 %   Eosinophils Absolute 0.1 0.0 - 0.5 K/uL   Basophils Relative 0 %   Basophils Absolute 0.0 0.0 - 0.1 K/uL   WBC Morphology MORPHOLOGY UNREMARKABLE    RBC Morphology MORPHOLOGY UNREMARKABLE    Smear Review Normal platelet morphology    Immature Granulocytes 2 %   Abs Immature Granulocytes 0.17 (H) 0.00 - 0.07 K/uL    Comment: Performed at [facility], [address], [place], [place] [address]  I-STAT 7, (LYTES, BLD GAS, ICA, H+H)     Status: Abnormal   Collection Time: 10/23/2020  1:28 AM  Result Value Ref Range   pH, Arterial 7.381 7.350 - 7.450   pCO2 arterial 43.2 32.0 - 48.0 mmHg   pO2, Arterial 505 (H) 83.0 - 108.0 mmHg   Bicarbonate 25.7 20.0 - 28.0 mmol/L   TCO2 27 22 - 32 mmol/L   O2 Saturation 100.0 %   Acid-Base Excess 0.0 0.0 - 2.0 mmol/L   Sodium 144 135 - 145 mmol/L   Potassium 4.7 3.5 - 5.1 mmol/L   Calcium, Ion 1.01 (L) 1.15 - 1.40 mmol/L   HCT 22.0 (L) 36.0 - 46.0 %   Hemoglobin 7.5 (L) 12.0 - 15.0 g/dL   Patient temperature 36.4 C    Sample type ARTERIAL   Lactate dehydrogenase     Status: Abnormal   Collection Time: 11/02/2020  4:20 AM  Result Value Ref Range   LDH  698 (H) 98 - 192 U/L    Comment: Performed at [facility], [place] [address], [place], [place] [address]  Hepatic function panel     Status: Abnormal   Collection Time: 10/08/2020  4:20 AM  Result Value Ref Range   Total Protein 3.3 (L) 6.5 - 8.1 g/dL   Albumin 1.8 (L) 3.5 - 5.0 g/dL   AST 177 (H) 15 - 41 U/L   ALT 40 0 - 44 U/L   Alkaline Phosphatase 61 38 - 126 U/L   Total Bilirubin 2.5 (H) 0.3 - 1.2 mg/dL   Bilirubin,  Direct 1.4 (H) 0.0 - 0.2 mg/dL   Indirect Bilirubin 1.1 (H) 0.3 - 0.9 mg/dL    Comment: Performed at [facility] [address] [place], [place] [address]  Magnesium     Status: None   Collection Time: 10/22/2020  4:20 AM  Result Value Ref Range   Magnesium 1.9 1.7 - 2.4 mg/dL    Comment: Performed at [facility] [address] [place], [place] [address]  APTT     Status: Abnormal   Collection Time: 10/13/2020  4:20 AM  Result Value Ref Range   aPTT 80 (H) 24 - 36 seconds    Comment:        IF BASELINE aPTT IS ELEVATED, SUGGEST PATIENT RISK ASSESSMENT BE USED TO DETERMINE APPROPRIATE ANTICOAGULANT THERAPY. Performed at [facility], [place] [address] [place], [place] [address]   Basic metabolic panel     Status: Abnormal   Collection Time: 10/17/2020  4:20 AM  Result Value Ref Range   Sodium 141 135 - 145 mmol/L   Potassium 4.8 3.5 - 5.1 mmol/L   Chloride 112 (H) 98 - 111 mmol/L   CO2 21 (L) 22 - 32 mmol/L   Glucose, Bld 167 (H) 70 - 99 mg/dL    Comment: Glucose reference range applies only to samples taken after fasting for at least 8 hours.   BUN 34 (H) 8 - 23 mg/dL   Creatinine, Ser 2.26 (H) 0.44 - 1.00 mg/dL   Calcium 6.3 (LL) 8.9 - 10.3 mg/dL    Comment: CRITICAL RESULT CALLED TO, READ BACK BY AND VERIFIED WITH: SARINE R,RN 10/14/2020 0512 WAYK    GFR, Estimated 22 (L) >60 mL/min    Comment: (NOTE) Calculated using the CKD-EPI Creatinine Equation (2021)    Anion gap 8 5 - 15    Comment: Performed at [facility] [address], [place], [place] [address]  CBC     Status: Abnormal   Collection Time: 10/16/2020  4:20 AM  Result Value Ref Range   WBC 7.9 4.0 - 10.5 K/uL   RBC 2.48 (L) 3.87 - 5.11 MIL/uL   Hemoglobin 7.6 (L) 12.0 - 15.0 g/dL   HCT 21.8 (L) 36.0 - 46.0 %   MCV 87.9 80.0 - 100.0 fL   MCH 30.6 26.0 - 34.0 pg   MCHC 34.9 30.0 - 36.0 g/dL   RDW 16.8 (H) 11.5 - 15.5 %   Platelets 42 (L) 150 - 400 K/uL    Comment: Immature Platelet Fraction may be clinically indicated, consider ordering this additional test [id number] CONSISTENT WITH PREVIOUS RESULT REPEATED TO VERIFY    nRBC 3.4 (H) 0.0 - 0.2 %    Comment: Performed at [facility], [place] [address] [place], [place] [address]  Fibrinogen     Status: None   Collection Time: 10/29/2020  4:20 AM  Result Value Ref Range   Fibrinogen 376 210 - 475 mg/dL    Comment: (NOTE) Fibrinogen results may be underestimated in patients receiving thrombolytic therapy. Performed at [facility], [place] [address] [place], [place] [address]   Protime-INR     Status: Abnormal   Collection Time: 11/04/2020  4:20 AM  Result Value Ref Range   Prothrombin Time 20.1 (H) 11.4 - 15.2 seconds   INR 1.7 (H) 0.8 - 1.2    Comment: (NOTE) INR goal varies based on device and disease states. Performed at [facility], [place]  [address], [place], [place] [address]   Heparin level (unfractionated)     Status: Abnormal   Collection Time: 10/13/2020  4:20 AM  Result Value Ref Range   Heparin Unfractionated <0.10 (L) 0.30 - 0.70 IU/mL    Comment: (NOTE) The clinical reportable range upper limit is being lowered to >1.10 to align with the FDA approved guidance for the current laboratory assay.  If heparin results are below expected values, and patient dosage has  been confirmed, suggest follow up testing of antithrombin III levels. Performed at [facility], [place] [address], [place], [place] [address]   Glucose, capillary     Status: Abnormal    Collection Time: 10/16/2020  4:20 AM  Result Value Ref Range   Glucose-Capillary 140 (H) 70 - 99 mg/dL    Comment: Glucose reference range applies only to samples taken after fasting for at least 8 hours.  Phosphorus     Status: None   Collection Time: 10/18/2020  4:20 AM  Result Value Ref Range   Phosphorus 3.3 2.5 - 4.6 mg/dL    Comment: Performed at [facility] [address] [place], [place] [address]  I-STAT 7, (LYTES, BLD GAS, ICA, H+H)     Status: Abnormal   Collection Time: 10/22/2020  4:23 AM  Result Value Ref Range   pH, Arterial 7.396 7.350 - 7.450   pCO2 arterial 39.0 32.0 - 48.0 mmHg   pO2, Arterial 421 (H) 83.0 - 108.0 mmHg   Bicarbonate 24.0 20.0 - 28.0 mmol/L   TCO2 25 22 - 32 mmol/L   O2 Saturation 100.0 %   Acid-base deficit 1.0 0.0 - 2.0 mmol/L   Sodium 143 135 - 145 mmol/L   Potassium 4.8 3.5 - 5.1 mmol/L   Calcium, Ion 0.99 (L) 1.15 - 1.40 mmol/L   HCT 19.0 (L) 36.0 - 46.0 %   Hemoglobin 6.5 (LL) 12.0 - 15.0 g/dL   Patient temperature 36.6 C    Sample type ARTERIAL    Comment NOTIFIED PHYSICIAN   .Cooxemetry Panel (carboxy, met, total hgb, O2 sat)     Status: Abnormal   Collection Time: 10/28/2020  4:31 AM  Result Value Ref Range   Total hemoglobin <4.0 (LL) 12.0 - 16.0 g/dL    Comment: REPEATED TO VERIFY CRITICAL RESULT CALLED TO, READ BACK BY AND VERIFIED WITH: A CAIN RN ON 10/18/2020_0  BY CHAYES QUESTIONABLE RESULTS, RECOMMEND RECOLLECT TO VERIFY A CAIN RN SAID SAMPLE WAS PROBABLY DILUTED, DID NOT WANT RECOLLECT 10/19/2020_1  BY CHAYES    O2 Saturation 97.1 %   Carboxyhemoglobin 1.7 (H) 0.5 - 1.5 %   Methemoglobin 2.1 (H) 0.0 - 1.5 %    Comment: Performed at [facility] [address], [place], [place] [address]  Prepare RBC (crossmatch)     Status: None   Collection Time: 10/29/2020  4:43 AM  Result Value Ref Range   Order Confirmation      ORDER PROCESSED BY BLOOD BANK Performed at [facility], [place] [address], [place], [place]  [address]   Prepare RBC (crossmatch)     Status: None   Collection Time: 10/23/2020  7:34 AM  Result Value Ref Range   Order Confirmation      ORDER PROCESSED BY BLOOD BANK BLOOD ALREADY AVAILABLE Performed at [facility], [place] [address] [place], [place] [address]   Prepare Pheresed Platelets     Status: None (Preliminary result)   Collection Time: 10/08/2020  7:42 AM  Result Value Ref Range   Unit Number [id number]    Blood Component Type PLTPHER LR2    Unit division 00    Status of Unit ISSUED    Transfusion Status      OK TO TRANSFUSE Performed at [facility] [address] [place], [place] [address]    Unit Number [id number]    Blood Component Type PLTP1 PSORALEN TREATED    Unit division 00    Status of Unit ISSUED    Transfusion Status OK TO TRANSFUSE   I-STAT 7, (LYTES, BLD GAS, ICA, H+H)     Status: Abnormal   Collection Time: 10/07/2020  8:29 AM  Result Value Ref Range   pH, Arterial 7.363 7.350 - 7.450   pCO2 arterial 36.1 32.0 - 48.0 mmHg   pO2, Arterial 34 (LL) 83.0 - 108.0 mmHg   Bicarbonate 20.7 20.0 - 28.0 mmol/L   TCO2 22 22 - 32 mmol/L   O2 Saturation 66.0 %   Acid-base deficit 4.0 (H) 0.0 - 2.0 mmol/L   Sodium 148 (H) 135 - 145 mmol/L   Potassium 4.7 3.5 - 5.1 mmol/L   Calcium, Ion 1.01 (L) 1.15 - 1.40 mmol/L   HCT 19.0 (L) 36.0 - 46.0 %   Hemoglobin 6.5 (LL) 12.0 - 15.0 g/dL   Patient temperature 36.3 C    Sample type ECMO Circuit    Comment NOTIFIED PHYSICIAN   I-STAT 7, (LYTES, BLD GAS, ICA, H+H)     Status: Abnormal   Collection Time: 10/13/2020  8:33 AM  Result Value Ref Range   pH, Arterial 7.414 7.350 - 7.450   pCO2 arterial 37.4 32.0 - 48.0 mmHg   pO2, Arterial 418 (H) 83.0 - 108.0 mmHg   Bicarbonate 24.1 20.0 - 28.0 mmol/L   TCO2 25 22 - 32 mmol/L   O2 Saturation 100.0 %   Acid-base deficit 1.0 0.0 - 2.0 mmol/L   Sodium 142 135 - 145 mmol/L   Potassium 4.7 3.5 - 5.1 mmol/L   Calcium, Ion 1.07 (L) 1.15 - 1.40 mmol/L   HCT  20.0 (L) 36.0 - 46.0 %   Hemoglobin 6.8 (LL) 12.0 - 15.0 g/dL   Patient temperature 36.3 C    Sample type ARTERIAL    Comment NOTIFIED PHYSICIAN   Glucose, capillary     Status: Abnormal   Collection Time: 10/18/2020  8:50 AM  Result Value Ref Range   Glucose-Capillary 151 (H) 70 - 99 mg/dL    Comment: Glucose reference range applies only to samples taken after fasting for at least 8 hours.  I-STAT 7, (LYTES, BLD GAS, ICA, H+H)     Status: Abnormal   Collection Time: 10/11/2020  9:31 AM  Result Value Ref Range   pH, Arterial 7.421 7.350 - 7.450   pCO2 arterial 35.1 32.0 - 48.0 mmHg   pO2, Arterial 420 (H) 83.0 - 108.0 mmHg   Bicarbonate 22.9 20.0 - 28.0 mmol/L   TCO2 24 22 - 32 mmol/L   O2 Saturation 100.0 %   Acid-base deficit 1.0 0.0 - 2.0 mmol/L   Sodium 143 135 - 145 mmol/L   Potassium 5.1 3.5 - 5.1 mmol/L   Calcium, Ion 1.03 (L) 1.15 - 1.40 mmol/L   HCT 21.0 (L) 36.0 - 46.0 %   Hemoglobin 7.1 (L) 12.0 - 15.0 g/dL   Patient temperature 36.7 C    Sample type ARTERIAL   POCT I-Stat EG7     Status: Abnormal   Collection Time: 10/11/2020  9:45 AM  Result Value Ref Range   pH, Ven 7.345 7.250 - 7.430   pCO2, Ven 38.6 (L) 44.0 - 60.0 mmHg   pO2, Ven 35.0 32.0 - 45.0 mmHg   Bicarbonate 21.2 20.0 - 28.0 mmol/L   TCO2 22 22 - 32 mmol/L   O2 Saturation 67.0 %   Acid-base deficit 4.0 (H) 0.0 - 2.0 mmol/L   Sodium 145 135 - 145 mmol/L   Potassium 4.8 3.5 - 5.1 mmol/L   Calcium, Ion 1.02 (L) 1.15 - 1.40 mmol/L   HCT 20.0 (L) 36.0 - 46.0 %   Hemoglobin 6.8 (LL) 12.0 - 15.0 g/dL   Patient temperature 36.5 C    Sample type VENOUS    Comment NOTIFIED PHYSICIAN   Prepare RBC (crossmatch)     Status: None   Collection Time: 10/28/2020 10:34 AM  Result Value Ref Range   Order Confirmation      ORDER PROCESSED BY BLOOD BANK Performed at [facility], [address] [place], [place] [address]   Glucose, capillary     Status: Abnormal   Collection Time: 10/13/2020 12:24 PM  Result  Value Ref Range   Glucose-Capillary 157 (H) 70 - 99 mg/dL    Comment: Glucose reference range applies only to samples taken after fasting for at least 8 hours.  I-STAT 7, (LYTES, BLD GAS, ICA, H+H)     Status: Abnormal   Collection Time: 10/07/2020  1:34 PM  Result Value Ref Range   pH, Arterial 7.371 7.350 - 7.450   pCO2 arterial 41.2 32.0 - 48.0 mmHg   pO2, Arterial 415 (H) 83.0 - 108.0 mmHg   Bicarbonate 24.0 20.0 - 28.0 mmol/L   TCO2 25 22 - 32 mmol/L   O2 Saturation 100.0 %   Acid-base deficit 1.0 0.0 - 2.0 mmol/L   Sodium 142 135 - 145 mmol/L   Potassium 4.7 3.5 - 5.1 mmol/L   Calcium, Ion 0.99 (L) 1.15 - 1.40 mmol/L   HCT 21.0 (L) 36.0 - 46.0 %   Hemoglobin 7.1 (L) 12.0 - 15.0 g/dL   Patient temperature 36.6 C    Sample type ARTERIAL   Global TEG Panel     Status: Abnormal   Collection Time: 11/02/2020  2:48 PM  Result Value Ref Range   Citrated Kaolin (R) >17.0 (H) 4.6 - 9.1 min   Citrated Kaolin (K) Not Measured 0.8 - 2.1 min   Citrated Kaolin Angle <39 (L) 63 - 78 deg   Citrated Kaolin (MA) <40 (L) 52 - 69 mm   Citrated Rapid TEG (MA) 62.4 52 - 70 mm   CK with Heparinase (R) 12.7 (H) 4.3 - 8.3 min   CFF Max Amplitude 23 15 - 32 mm   Citrated Functional Fibrinogen 419.7 278 - 581 mg/dL    Comment: Performed at [facility], [place] [address] [place], [place] [address]  I-STAT 7, (LYTES, BLD GAS, ICA, H+H)     Status: Abnormal   Collection Time: 10/22/2020  4:15 PM  Result Value Ref Range   pH, Arterial 7.475 (H) 7.350 - 7.450   pCO2 arterial 24.7 (L) 32.0 - 48.0 mmHg   pO2, Arterial 171 (H) 83.0 - 108.0 mmHg   Bicarbonate 18.2 (L) 20.0 - 28.0 mmol/L   TCO2 19 (L) 22 - 32 mmol/L   O2 Saturation 100.0 %   Acid-base deficit 5.0 (H) 0.0 - 2.0 mmol/L   Sodium 145 135 - 145 mmol/L   Potassium 4.5  3.5 - 5.1 mmol/L   Calcium, Ion 0.84 (LL) 1.15 - 1.40 mmol/L   HCT 18.0 (L) 36.0 - 46.0 %   Hemoglobin 6.1 (LL) 12.0 - 15.0 g/dL   Patient temperature 37.0 C    Sample type  ARTERIAL   Glucose, capillary     Status: Abnormal   Collection Time: 10/06/2020  5:15 PM  Result Value Ref Range   Glucose-Capillary 130 (H) 70 - 99 mg/dL    Comment: Glucose reference range applies only to samples taken after fasting for at least 8 hours.  I-STAT 7, (LYTES, BLD GAS, ICA, H+H)     Status: Abnormal   Collection Time: 10/28/2020  5:17 PM  Result Value Ref Range   pH, Arterial 7.414 7.350 - 7.450   pCO2 arterial 33.8 32.0 - 48.0 mmHg   pO2, Arterial 328 (H) 83.0 - 108.0 mmHg   Bicarbonate 21.8 20.0 - 28.0 mmol/L   TCO2 23 22 - 32 mmol/L   O2 Saturation 100.0 %   Acid-base deficit 3.0 (H) 0.0 - 2.0 mmol/L   Sodium 144 135 - 145 mmol/L   Potassium 4.4 3.5 - 5.1 mmol/L   Calcium, Ion 0.90 (L) 1.15 - 1.40 mmol/L   HCT 21.0 (L) 36.0 - 46.0 %   Hemoglobin 7.1 (L) 12.0 - 15.0 g/dL   Patient temperature 36.4 C    Sample type ARTERIAL   Prepare RBC (crossmatch)     Status: None   Collection Time: 10/13/2020  5:32 PM  Result Value Ref Range   Order Confirmation      ORDER PROCESSED BY BLOOD BANK Performed at [facility], [address], [place], [place] [address]   Glucose, capillary     Status: Abnormal   Collection Time: 10/07/2020  9:30 PM  Result Value Ref Range   Glucose-Capillary 113 (H) 70 - 99 mg/dL    Comment: Glucose reference range applies only to samples taken after fasting for at least 8 hours.  Basic metabolic panel     Status: Abnormal   Collection Time: 10/23/2020  9:32 PM  Result Value Ref Range   Sodium 142 135 - 145 mmol/L   Potassium 4.6 3.5 - 5.1 mmol/L   Chloride 114 (H) 98 - 111 mmol/L   CO2 20 (L) 22 - 32 mmol/L   Glucose, Bld 132 (H) 70 - 99 mg/dL    Comment: Glucose reference range applies only to samples taken after fasting for at least 8 hours.   BUN 41 (H) 8 - 23 mg/dL   Creatinine, Ser 2.67 (H) 0.44 - 1.00 mg/dL   Calcium 6.1 (LL) 8.9 - 10.3 mg/dL    Comment: CRITICAL RESULT CALLED TO, READ BACK BY AND VERIFIED WITH: CAMMON M,RN 10/29/2020  2157 WAYK    GFR, Estimated 18 (L) >60 mL/min    Comment: (NOTE) Calculated using the CKD-EPI Creatinine Equation (2021)    Anion gap 8 5 - 15    Comment: Performed at [facility] [address] [place], [place] [address]  CBC     Status: Abnormal   Collection Time: 11/02/2020  9:32 PM  Result Value Ref Range   WBC 9.7 4.0 - 10.5 K/uL   RBC 2.90 (L) 3.87 - 5.11 MIL/uL   Hemoglobin 8.7 (L) 12.0 - 15.0 g/dL   HCT 25.5 (L) 36.0 - 46.0 %   MCV 87.9 80.0 - 100.0 fL   MCH 30.0 26.0 - 34.0 pg   MCHC 34.1 30.0 - 36.0  g/dL   RDW 15.3 11.5 - 15.5 %   Platelets 52 (L) 150 - 400 K/uL    Comment: Immature Platelet Fraction may be clinically indicated, consider ordering this additional test [id number] CONSISTENT WITH PREVIOUS RESULT REPEATED TO VERIFY    nRBC 2.4 (H) 0.0 - 0.2 %    Comment: Performed at [facility], [place] [address] [place], [place] [address]  I-STAT 7, (LYTES, BLD GAS, ICA, H+H)     Status: Abnormal   Collection Time: 10/16/2020  9:33 PM  Result Value Ref Range   pH, Arterial 7.358 7.350 - 7.450   pCO2 arterial 40.7 32.0 - 48.0 mmHg   pO2, Arterial 341 (H) 83.0 - 108.0 mmHg   Bicarbonate 23.0 20.0 - 28.0 mmol/L   TCO2 24 22 - 32 mmol/L   O2 Saturation 100.0 %   Acid-base deficit 2.0 0.0 - 2.0 mmol/L   Sodium 143 135 - 145 mmol/L   Potassium 4.5 3.5 - 5.1 mmol/L   Calcium, Ion 0.95 (L) 1.15 - 1.40 mmol/L   HCT 21.0 (L) 36.0 - 46.0 %   Hemoglobin 7.1 (L) 12.0 - 15.0 g/dL   Patient temperature 36.8 C    Sample type ARTERIAL   Glucose, capillary     Status: Abnormal   Collection Time: 11/12/2020 12:21 AM  Result Value Ref Range   Glucose-Capillary 132 (H) 70 - 99 mg/dL    Comment: Glucose reference range applies only to samples taken after fasting for at least 8 hours.  I-STAT 7, (LYTES, BLD GAS, ICA, H+H)     Status: Abnormal   Collection Time: 11-12-20 12:24 AM  Result Value Ref Range   pH, Arterial 7.291 (L) 7.350 - 7.450   pCO2 arterial 43.8 32.0 -  48.0 mmHg   pO2, Arterial 352 (H) 83.0 - 108.0 mmHg   Bicarbonate 21.2 20.0 - 28.0 mmol/L   TCO2 22 22 - 32 mmol/L   O2 Saturation 100.0 %   Acid-base deficit 5.0 (H) 0.0 - 2.0 mmol/L   Sodium 144 135 - 145 mmol/L   Potassium 4.8 3.5 - 5.1 mmol/L   Calcium, Ion 1.01 (L) 1.15 - 1.40 mmol/L   HCT 24.0 (L) 36.0 - 46.0 %   Hemoglobin 8.2 (L) 12.0 - 15.0 g/dL   Patient temperature 36.8 C    Sample type ARTERIAL   I-STAT 7, (LYTES, BLD GAS, ICA, H+H)     Status: Abnormal   Collection Time: 11/12/2020  1:54 AM  Result Value Ref Range   pH, Arterial 7.354 7.350 - 7.450   pCO2 arterial 40.5 32.0 - 48.0 mmHg   pO2, Arterial 373 (H) 83.0 - 108.0 mmHg   Bicarbonate 22.7 20.0 - 28.0 mmol/L   TCO2 24 22 - 32 mmol/L   O2 Saturation 100.0 %   Acid-base deficit 3.0 (H) 0.0 - 2.0 mmol/L   Sodium 143 135 - 145 mmol/L   Potassium 5.0 3.5 - 5.1 mmol/L   Calcium, Ion 0.99 (L) 1.15 - 1.40 mmol/L   HCT 22.0 (L) 36.0 - 46.0 %   Hemoglobin 7.5 (L) 12.0 - 15.0 g/dL   Patient temperature 36.8 C    Sample type ARTERIAL   Lactate dehydrogenase     Status: Abnormal   Collection Time: 11-12-2020  2:34 AM  Result Value Ref Range   LDH 970 (H) 98 - 192 U/L    Comment: Performed at [facility], [place] [address], [place], [place] [address]  .Cooxemetry Panel (carboxy, met,  total hgb, O2 sat)     Status: Abnormal   Collection Time: 11/14/20  2:34 AM  Result Value Ref Range   Total hemoglobin 8.9 (L) 12.0 - 16.0 g/dL   O2 Saturation 91.0 %   Carboxyhemoglobin 2.0 (H) 0.5 - 1.5 %   Methemoglobin 1.4 0.0 - 1.5 %    Comment: Performed at [facility] [address], [place], [place] [address]  Magnesium     Status: None   Collection Time: 11-14-20  2:34 AM  Result Value Ref Range   Magnesium 2.1 1.7 - 2.4 mg/dL    Comment: Performed at [facility] [address] [place], [place] [address]  APTT     Status: Abnormal   Collection Time: 11/14/20  2:34 AM  Result Value Ref Range   aPTT 100 (H)  24 - 36 seconds    Comment:        IF BASELINE aPTT IS ELEVATED, SUGGEST PATIENT RISK ASSESSMENT BE USED TO DETERMINE APPROPRIATE ANTICOAGULANT THERAPY. Performed at [facility], [place] [address], [place], [place] [address]   Basic metabolic panel     Status: Abnormal   Collection Time: 2020-11-14  2:34 AM  Result Value Ref Range   Sodium 141 135 - 145 mmol/L   Potassium 5.1 3.5 - 5.1 mmol/L   Chloride 113 (H) 98 - 111 mmol/L   CO2 20 (L) 22 - 32 mmol/L   Glucose, Bld 135 (H) 70 - 99 mg/dL    Comment: Glucose reference range applies only to samples taken after fasting for at least 8 hours.   BUN 44 (H) 8 - 23 mg/dL   Creatinine, Ser 2.80 (H) 0.44 - 1.00 mg/dL   Calcium 6.4 (LL) 8.9 - 10.3 mg/dL    Comment: CRITICAL RESULT CALLED TO, READ BACK BY AND VERIFIED WITH: CAMMON M,RN 2020-11-14 0321 WAYK    GFR, Estimated 17 (L) >60 mL/min    Comment: (NOTE) Calculated using the CKD-EPI Creatinine Equation (2021)    Anion gap 8 5 - 15    Comment: Performed at [facility] [address] [place], [place] [address]  CBC     Status: Abnormal   Collection Time: 11/14/20  2:34 AM  Result Value Ref Range   WBC 13.3 (H) 4.0 - 10.5 K/uL   RBC 3.05 (L) 3.87 - 5.11 MIL/uL   Hemoglobin 9.2 (L) 12.0 - 15.0 g/dL   HCT 26.3 (L) 36.0 - 46.0 %   MCV 86.2 80.0 - 100.0 fL   MCH 30.2 26.0 - 34.0 pg   MCHC 35.0 30.0 - 36.0 g/dL   RDW 15.4 11.5 - 15.5 %   Platelets 41 (L) 150 - 400 K/uL    Comment: Immature Platelet Fraction may be clinically indicated, consider ordering this additional test [id number] CONSISTENT WITH PREVIOUS RESULT CRITICAL VALUE NOTED.  VALUE IS CONSISTENT WITH PREVIOUSLY REPORTED AND CALLED VALUE. REPEATED TO VERIFY    nRBC 2.6 (H) 0.0 - 0.2 %    Comment: Performed at [facility], [place] [address] [place], [place] [address]  Fibrinogen     Status: None   Collection Time: 2020/11/14  2:34 AM  Result Value Ref Range   Fibrinogen 309 210 - 475 mg/dL    Comment:  (NOTE) Fibrinogen results may be underestimated in patients receiving thrombolytic therapy. Performed at [facility], [place] [address] [place], [place] [address]   Protime-INR     Status: Abnormal  Collection Time: 11/14/2020  2:34 AM  Result Value Ref Range   Prothrombin Time 23.1 (H) 11.4 - 15.2 seconds   INR 2.1 (H) 0.8 - 1.2    Comment: (NOTE) INR goal varies based on device and disease states. Performed at [facility], [place] [address], [place], [place] [address]   Heparin level (unfractionated)     Status: Abnormal   Collection Time: 2020-11-14  2:34 AM  Result Value Ref Range   Heparin Unfractionated 0.10 (L) 0.30 - 0.70 IU/mL    Comment: (NOTE) The clinical reportable range upper limit is being lowered to >1.10 to align with the FDA approved guidance for the current laboratory assay.  If heparin results are below expected values, and patient dosage has  been confirmed, suggest follow up testing of antithrombin III levels. Performed at [facility], [place] [address], [place], [place] [address]   Glucose, capillary     Status: Abnormal   Collection Time: 11/14/20  3:51 AM  Result Value Ref Range   Glucose-Capillary 123 (H) 70 - 99 mg/dL    Comment: Glucose reference range applies only to samples taken after fasting for at least 8 hours.  Glucose, capillary     Status: Abnormal   Collection Time: Nov 14, 2020  9:14 AM  Result Value Ref Range   Glucose-Capillary 124 (H) 70 - 99 mg/dL    Comment: Glucose reference range applies only to samples taken after fasting for at least 8 hours.   DG CHEST PORT 1 VIEW  Result Date: 11-14-2020 CLINICAL DATA:  ETT, open sternum, ECMO EXAM: PORTABLE CHEST 1 VIEW COMPARISON:  10/22/2020 FINDINGS: No significant change in AP portable chest radiograph with extensive support apparatus including endotracheal tube, esophagogastric tube, right neck pulmonary vascular catheter, ECMO cannula, Impella device, and bilateral chest and  mediastinal drainage tubes tubes. There is near complete opacification of the right hemithorax and atelectasis or consolidation of the right lung. Diffuse interstitial opacity of the left lung. No new airspace opacity. IMPRESSION: 1. No significant change in AP portable chest radiograph with extensive support apparatus as detailed above. 2. Near complete opacification of the right hemithorax with pleural effusion and atelectasis or consolidation of the right lung. 3.  Edema of the aerated left lung. Electronically Signed   By: Eddie Candle M.D.   On: 11-14-2020 08:35   DG CHEST PORT 1 VIEW  Result Date: 10/11/2020 CLINICAL DATA:  Pulmonary edema EXAM: PORTABLE CHEST 1 VIEW COMPARISON:  Chest radiograph obtained earlier the same day FINDINGS: The endotracheal tube tip is in the midthoracic trachea. A right IJ Swan-Ganz catheter is in place terminating in the right pulmonary artery. The enteric catheter tip is off the field of view. A right upper extremity balloon pump projects over the left ventricle. ECMO cannulae are in stable position. A presumed mediastinal drain and left chest tube are in place. There is worsened aeration of the right lung which is now essentially completely opacified. There is confluent opacity in the left base. The left upper lung is relatively well aerated. There is no significant left effusion. There is no definite pneumothorax. The bones are stable. IMPRESSION: 1. Worsened aeration of the right lung which is now essentially completely opacified. 2. Hazy opacity in the left base with relative sparing of the left upper lobe. Electronically Signed   By: Valetta Mole M.D.   On: 10/17/2020 20:57   DG CHEST PORT 1 VIEW  Result Date: 10/17/2020 CLINICAL DATA:  75 year old female on  ECMO status post myocardial infarction, heart failure, cardiogenic shock. Postoperative day zero left lower extremity revascularization from acute thrombus superimposed on peripheral vascular disease.  Postoperative day 5 status post CABG. EXAM: PORTABLE CHEST 1 VIEW COMPARISON:  Portable chest 11/02/2020 and earlier. FINDINGS: Portable AP semi upright view at 0524 hours. Intubated, endotracheal tube tip at the clavicles. Enteric tube courses to the abdomen, tip not included. Right chest ECMO catheter, right upper extremity approach cardiac balloon pump and 2 additional left chest probably ECMO related cannulas in place. Right IJ approach Swan-Ganz catheter, tip now convincingly at the level of the right heart. Bilateral chest tubes in place. Indistinct mediastinal contours, and increased paratracheal and lateral mediastinal density on the right which might reflect some recent operative mediastinal hematoma. But overall stable mediastinal size since 10/29/2020. Improved left lung ventilation from yesterday but patchy bilateral pulmonary opacity has increased since 10/29/2020, confluent in the right apex also. No pneumothorax. Indistinct pulmonary vasculature. Paucity of bowel gas. No acute osseous abnormality identified. IMPRESSION: 1. Extensive line and tube placement stable since yesterday. 2. Low lung volumes with improved left lung ventilation since yesterday but worsening bilateral ventilation compared to 10/29/2020 which may represent a combination of pulmonary edema and atelectasis. No pneumothorax identified. Electronically Signed   By: Genevie Ann M.D.   On: 10/10/2020 06:14   VAS [place] LOWER EXTREMITY ARTERIAL DUPLEX  Result Date: 10/10/2020 LOWER EXTREMITY ARTERIAL DUPLEX STUDY Patient Name:  Veronica Swanson  Date of Exam:   10/14/2020 Medical Rec #: [id number]         Accession #:    [id number] Date of Birth: Jan 11, 1946         Patient Gender: F Patient Age:   35 years Exam Location:  [facility] Procedure:      VAS [place] LOWER EXTREMITY ARTERIAL DUPLEX Referring Phys: Servando Snare --------------------------------------------------------------------------------  Indications: Peripheral artery disease,  and Cold left extremity, no              Dopplerable/palpable pulses. High Risk Factors: Hypertension, hyperlipidemia, prior MI. 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  Current ABI: n/a Limitations: lines, ECMO, vent, Comparison Study: No prior left LEA Performing Technologist: Sharion Dove RVS  Examination Guidelines: A complete evaluation includes B-mode imaging, spectral Doppler, color Doppler, and power Doppler as needed of all accessible portions of each vessel. Bilateral testing is considered an integral part of a complete examination. Limited examinations for reoccurring indications may be performed as noted.   +----------+--------+-----+--------+-------------------+-----------------------+ LEFT      PSV cm/sRatioStenosisWaveform           Comments                +----------+--------+-----+--------+-------------------+-----------------------+ CFA Prox                       dampened monophasic                        +----------+--------+-----+--------+-------------------+-----------------------+ DFA                            dampened monophasic                        +----------+--------+-----+--------+-------------------+-----------------------+ SFA Prox                       dampened monophasic                        +----------+--------+-----+--------+-------------------+-----------------------+  SFA Mid                                           not visualized                                                            secondary to line       +----------+--------+-----+--------+-------------------+-----------------------+ SFA Distal                     dampened monophasic                        +----------+--------+-----+--------+-------------------+-----------------------+ POP Prox                       dampened monophasic                         +----------+--------+-----+--------+-------------------+-----------------------+ POP Distal                     dampened monophasic                        +----------+--------+-----+--------+-------------------+-----------------------+ ATA Distal                     absent                                     +----------+--------+-----+--------+-------------------+-----------------------+ PTA Prox                       absent                                     +----------+--------+-----+--------+-------------------+-----------------------+ PTA Mid                        absent                                     +----------+--------+-----+--------+-------------------+-----------------------+ PTA Distal                     absent                                     +----------+--------+-----+--------+-------------------+-----------------------+  Summary: Left: Severely dampened monophasic flow noted in the common femoral, femoral, profunda, and popliteal arteries. The posterior tibial and anterior tibial arterial flow is absent.  See table(s) above for measurements and observations. Electronically signed by Orlie Pollen on 10/13/2020 at 5:47:37 PM.    Final    HYBRID OR IMAGING ([place])  Result Date: 10/26/2020 There is no interpretation for this exam.  This order is for images obtained during a surgical procedure.  Please See "Surgeries" Tab for more information regarding the procedure.  PMH:   Past Medical History:  Diagnosis Date   Carotid artery occlusion    Coronary artery disease    Hyperlipidemia    Hypertension    Myocardial infarction ([place]) 1997    PSH:   Past Surgical History:  Procedure Laterality Date   ABDOMINAL HYSTERECTOMY     BEDSIDE EVACUATION OF HEMATOMA N/A 10/24/2020   Procedure: BEDSIDE EXPLORATION OF POST-OP PATIENT;  Surgeon: Melrose Nakayama, MD;  Location: [facility];  Service: Vascular;  Laterality: N/A;   CANNULATION FOR ECMO  (EXTRACORPOREAL MEMBRANE OXYGENATION)  10/24/2020   Procedure: CANNULATION FOR ECMO (EXTRACORPOREAL MEMBRANE OXYGENATION);  Surgeon: Melrose Nakayama, MD;  Location: [place];  Service: Open Heart Surgery;;   CARDIAC CATHETERIZATION     CORONARY ARTERY BYPASS GRAFT N/A 10/08/2020   Procedure: CORONARY ARTERY BYPASS GRAFTING (CABG) TIMES FOUR, ON PUMP, USING LEFT INTERNAL MAMMARY ARTERY AND ENDOSCOPICALLY HARVESTED RIGHT GREATER SAPHENOUS VEIN;  Surgeon: Melrose Nakayama, MD;  Location: [place];  Service: Open Heart Surgery;  Laterality: N/A;   ENDOVEIN HARVEST OF GREATER SAPHENOUS VEIN Right 10/20/2020   Procedure: ENDOVEIN HARVEST OF GREATER SAPHENOUS VEIN;  Surgeon: Melrose Nakayama, MD;  Location: [place];  Service: Open Heart Surgery;  Laterality: Right;   HEMATOMA EVACUATION N/A 10/14/2020   Procedure: BEDSIDE POST OPERATIVE CORONARY ARTERY BYPASS GRAFTING EXPLORATION;  Surgeon: Melrose Nakayama, MD;  Location: [facility] OR;  Service: Vascular;  Laterality: N/A;   LEFT HEART CATH AND CORONARY ANGIOGRAPHY N/A 10/28/2020   Procedure: LEFT HEART CATH AND CORONARY ANGIOGRAPHY;  Surgeon: Nigel Mormon, MD;  Location: [place] [facility];  Service: Cardiovascular;  Laterality: N/A;   LOWER EXTREMITY ANGIOGRAM Left 10/26/2020   Procedure: LOWER EXTREMITY ANGIOGRAM;  Surgeon: Waynetta Sandy, MD;  Location: [place];  Service: Vascular;  Laterality: Left;   [place] LEFT VENTRICULAR ASSIST DEVICE  10/24/2020   Procedure: PLACEMENT OF IMPELLA LEFT VENTRICULAR ASSIST DEVICE;  Surgeon: Melrose Nakayama, MD;  Location: [place];  Service: Open Heart Surgery;;   STERNAL WOUND DEBRIDEMENT N/A 10/10/2020   Procedure: Niel Hummer OF STERNAL HEMATOMA;  Surgeon: Melrose Nakayama, MD;  Location: [place];  Service: Thoracic;  Laterality: N/A;   TEE WITHOUT CARDIOVERSION N/A 10/07/2020   Procedure: TRANSESOPHAGEAL ECHOCARDIOGRAM (TEE);  Surgeon: Melrose Nakayama, MD;  Location: [place];   Service: Open Heart Surgery;  Laterality: N/A;   THROMBECTOMY FEMORAL ARTERY Left 10/11/2020   Procedure: THROMBECTOMY Left  FEMORAL ARTERY with Bovine Patch.;  Surgeon: Waynetta Sandy, MD;  Location: [place];  Service: Vascular;  Laterality: Left;    Allergies:  Allergies  Allergen Reactions   Penicillins Other (See Comments)    Dizzy     Medications:   Prior to Admission medications   Medication Sig Start Date End Date Taking? Authorizing Provider  amLODipine (NORVASC) 5 MG tablet Take 5 mg by mouth every morning. 08/29/20  Yes [provider]  aspirin 81 MG EC tablet Take 162 mg by mouth every morning. Swallow whole.   Yes [provider]  latanoprost (XALATAN) 0.005 % ophthalmic solution Place 1 drop into both eyes at bedtime. 01/15/20  Yes [provider]  losartan-hydrochlorothiazide (HYZAAR) 50-12.5 MG tablet TAKE 1 TABLET BY MOUTH EVERY DAY Patient taking differently: Take 1 tablet by mouth every morning. 04/06/19  Yes Miquel Dunn, NP  Multiple Vitamin (MULTIVITAMIN WITH MINERALS) TABS tablet Take 1 tablet by mouth every morning. Centrum Silver   Yes [provider]  rosuvastatin (CRESTOR) 20 MG tablet TAKE 1 TABLET BY MOUTH EVERY DAY Patient taking differently: Take 20 mg by mouth every morning. 04/02/19  Yes Adrian Prows, MD    Discontinued Meds:   Medications Discontinued During This Encounter  Medication Reason   amLODipine (NORVASC) 10 MG tablet Dose change   ipratropium (ATROVENT) 0.03 % nasal spray Patient Preference   fluticasone (FLONASE) 50 MCG/ACT nasal spray Patient Preference   midazolam (VERSED) injection Patient Transfer   fentaNYL (SUBLIMAZE) injection Patient Transfer   Heparin (Porcine) in NaCl 1000-0.9 UT/500ML-% SOLN Patient Transfer   lidocaine (PF) (XYLOCAINE) 1 % injection Patient Transfer   Radial Cocktail/Verapamil only Patient Transfer   heparin sodium (porcine) injection Patient Transfer   iohexol  (OMNIPAQUE) 350 MG/ML injection Patient Transfer   heparin bolus via infusion 4,000 Units    heparin ADULT infusion 100 units/mL (25000 units/228mL)    furosemide (LASIX) injection 40 mg    aspirin 81 MG chewable tablet Returned to ADS   acetaminophen (TYLENOL) tablet 650 mg    ondansetron (ZOFRAN) injection 4 mg    sodium chloride flush (NS) 0.9 % injection 3 mL    sodium chloride flush (NS) 0.9 % injection 3 mL    0.9 %  sodium chloride infusion    nitroGLYCERIN 50 mg in dextrose 5 % 250 mL (0.2 mg/mL) infusion    amLODipine (NORVASC) tablet 5 mg    latanoprost (XALATAN) 0.005 % ophthalmic solution 1 drop    aspirin EC tablet 162 mg    heparin ADULT infusion 100 units/mL (25000 units/237mL)    traMADol (ULTRAM) tablet 50 mg    metoprolol succinate (TOPROL-XL) 24 hr tablet 25 mg    spironolactone (ALDACTONE) tablet 12.5 mg    dapagliflozin propanediol (FARXIGA) tablet 10 mg    ivabradine (CORLANOR) tablet 5 mg    polyethylene glycol (MIRALAX / GLYCOLAX) packet 17 g    insulin regular, human (MYXREDLIN) 100 units/ 100 mL infusion    nitroGLYCERIN 50 mg in dextrose 5 % 250 mL (0.2 mg/mL) infusion    magnesium sulfate (IV Push/IM) injection 40 mEq    potassium chloride injection 80 mEq    heparin 30,000 units/NS 1000 mL solution for CELLSAVER    heparin sodium (porcine) 5,000 Units, papaverine 60 mg in electrolyte-A (PLASMALYTE-A PH 7.4) 1,000 mL irrigation    tranexamic acid (CYKLOKAPRON) pump prime solution 136 mg    ceFAZolin (ANCEF) IVPB 2g/100 mL premix    bisacodyl (DULCOLAX) EC tablet 5 mg    0.9 % irrigation (POUR BTL)    hemostatic agents (no charge) Optime    heparin sodium (porcine) 5,000 Units, papaverine 60 mg in electrolyte-A (PLASMALYTE-A PH 7.4) 1,000 mL irrigation    Surgifoam 1 Gm with 0.9% sodium chloride (4 ml) topical solution    hemostatic agents (no charge) Optime Patient Discharge   heparin 50 units/mL (Impella PURGE) in dextrose 5% 1000 mL Patient Transfer    tranexamic acid (CYKLOKAPRON) 2,500 mg in sodium chloride 0.9 % 250 mL (10 mg/mL) infusion Patient Transfer   dexmedetomidine (PRECEDEX) 400 MCG/100ML (4 mcg/mL) infusion Patient Transfer   midazolam (VERSED) injection 2 mg    morphine 2 MG/ML injection 1-4 mg    metoprolol tartrate (LOPRESSOR) injection 2.5-5 mg    metoprolol tartrate (LOPRESSOR) tablet 12.5 mg    metoprolol tartrate (LOPRESSOR) 25 mg/10 mL oral suspension 12.5 mg    metoCLOPramide (REGLAN) injection 10 mg    midazolam (VERSED) injection 1 mg    midazolam (VERSED)  injection 1 mg    norepinephrine (LEVOPHED) 80m in 2532mpremix infusion    docusate sodium (COLACE) capsule 200 mg    pantoprazole (PROTONIX) EC tablet 40 mg    famotidine (PEPCID) IVPB 20 mg in NS 100 mL IVPB    calcium chloride 1 g in sodium chloride 0.9 % 100 mL IVPB    vancomycin (VANCOCIN) IVPB 1000 mg/200 mL premix    levofloxacin (LEVAQUIN) IVPB 750 mg    cefUROXime (ZINACEF) injection 1.5 g    traMADol (ULTRAM) tablet 50-100 mg    oxyCODONE (Oxy IR/ROXICODONE) immediate release tablet 5-10 mg    dexmedetomidine (PRECEDEX) 400 MCG/100ML (4 mcg/mL) infusion    dexmedetomidine (PRECEDEX) 400 MCG/100ML (4 mcg/mL) infusion    hemostatic agents (no charge) Optime Patient Discharge   chlorhexidine (PERIDEX) 0.12 % solution 15 mL    insulin regular, human (MYXREDLIN) 100 units/ 100 mL infusion    dextrose 50 % solution 0-50 mL    calcium chloride injection 1 g    dexmedetomidine (PRECEDEX) 400 MCG/100ML (4 mcg/mL) infusion    amiodarone (NEXTERONE PREMIX) 360-4.14 MG/200ML-% (1.8 mg/mL) IV infusion    feeding supplement (VITAL HIGH PROTEIN) liquid 1,000 mL    hemostatic agents (no charge) Optime Patient Discharge   0.9 % irrigation (POUR BTL) Patient Discharge   vancomycin (VANCOREADY) IVPB 500 mg/100 mL    lactated ringers infusion    sodium chloride flush (NS) 0.9 % injection 3 mL    0.9 %  sodium chloride infusion    lactated ringers infusion 500 mL     0.9 %  sodium chloride infusion    0.9 %  sodium chloride infusion    0.9 %  sodium chloride infusion (Manually program via Guardrails IV Fluids)    0.9 %  sodium chloride infusion (Manually program via Guardrails IV Fluids)    0.9 %  sodium chloride infusion (Manually program via Guardrails IV Fluids)    HYDROmorphone (DILAUDID) injection 0.5 mg    sodium chloride flush (NS) 0.9 % injection 10-40 mL    0.9 %  sodium chloride infusion (Manually program via Guardrails IV Fluids)    0.9 %  sodium chloride infusion (Manually program via Guardrails IV Fluids)    magnesium sulfate IVPB 2 g 50 mL    0.9 %  sodium chloride infusion    nitroGLYCERIN 50 mg in dextrose 5 % 250 mL (0.2 mg/mL) infusion    phenylephrine (NEO-SYNEPHRINE) 2075mS 250m43memix infusion    DOPamine (INTROPIN) 800 mg in dextrose 5 % 250 mL (3.2 mg/mL) infusion    norepinephrine (LEVOPHED) 16 mg in 250mL74mmix infusion    0.9 %  sodium chloride infusion    polyethylene glycol (MIRALAX / GLYCOLAX) packet 17 g    acetaminophen (TYLENOL) 160 MG/5ML solution 650 mg    acetaminophen (TYLENOL) suppository 650 mg    acetaminophen (TYLENOL) tablet 1,000 mg    acetaminophen (TYLENOL) 160 MG/5ML solution 1,000 mg    furosemide (LASIX) 200 mg in dextrose 5 % 100 mL (2 mg/mL) infusion    furosemide (LASIX) 200 mg in dextrose 5 % 100 mL (2 mg/mL) infusion    feeding supplement (VITAL HIGH PROTEIN) liquid 1,000 mL    vecuronium (NORCURON) 10 MG injection Returned to ADS   furosemide (LASIX) 200 mg in dextrose 5 % 100 mL (2 mg/mL) infusion    milrinone (PRIMACOR) 20 MG/100 ML (0.2 mg/mL) infusion    vancomycin (VANCOREADY) IVPB 750 mg/150 mL    feeding  supplement (PIVOT 1.5 CAL) liquid 1,000 mL    cefUROXime (ZINACEF) 1.5 g in sodium chloride 0.9 % 100 mL IVPB    hemostatic agents (no charge) Optime Patient Discharge   hemostatic agents (no charge) Optime Patient Discharge   70 mL Visipaque 320 mg/mL mixed with 0.9% normal saline  (30 mL) injection Patient Discharge   heparin 6000 units / NS 500 mL irrigation Patient Discharge   0.9 % irrigation (POUR BTL) Patient Discharge   rosuvastatin (CRESTOR) tablet 40 mg    feeding supplement (PIVOT 1.5 CAL) liquid 1,000 mL    0.9 % irrigation (POUR BTL) Patient Transfer   pantoprazole (PROTONIX) injection 40 mg    fluconazole (DIFLUCAN) IVPB 200 mg     Social History:  reports that she has been smoking cigarettes. She has never used smokeless tobacco. She reports that she does not drink alcohol and does not use drugs.  Family History:   Family History  Problem Relation Age of Onset   Heart disease Mother    Hypertension Father    Heart disease Father    Thyroid disease Neg Hx     Blood pressure (!) 120/93, pulse 79, temperature 98.4 F (36.9 C), resp. rate 20, height _0  (1.702 m), weight 91 kg, SpO2 100 %. General appearance: Intubated and sedated Head: Normocephalic, without obvious abnormality, atraumatic Eyes: negative Back: negative Resp: rhonchi bilaterally Chest wall: Open wound with 2 large cannulas for the ECMO Cardio: regular rate and rhythm GI: No BS Extremities: edema 2+ Pulses: no pulses distally Skin: mottling distally       Cannon Quinton, Hunt Oris, MD 11-22-2020, 9:20 AM

## 2020-11-05 NOTE — Progress Notes (Signed)
1 Day Post-Op Procedure(s) (LRB): REXPLORATION OF MEDIASTINUM (N/A) CIRCUIT CHANGE FOR ECMO (EXTRACORPOREAL MEMBRANE OXYGENATION) (N/A) TRANSESOPHAGEAL ECHOCARDIOGRAM (TEE) (N/A) APPLICATION OF CELL SAVER Subjective: Remains intubated, sedated  Objective: Vital signs in last 24 hours: Temp:  [97.7 F (36.5 C)-98.4 F (36.9 C)] 98.4 F (36.9 C) (09/27 0700) Pulse Rate:  [25-99] 25 (09/27 0100) Cardiac Rhythm: Atrial fibrillation (09/27 0400) Resp:  [0-21] 0 (09/27 0700) SpO2:  [100 %] 100 % (09/27 0400) Arterial Line BP: (74-125)/(59-99) 95/72 (09/27 0700) FiO2 (%):  [40 %-100 %] 40 % (09/27 0348) Weight:  [91 kg] 91 kg (09/27 0500)  Hemodynamic parameters for last 24 hours: PAP: (10-28)/(6-21) 12/6 CVP:  [7 mmHg] 7 mmHg  Intake/Output from previous day: 09/26 0701 - 09/27 0700 In: 7930.3 [I.V.:2433.8; Blood:3840.1; NG/GT:161.7; IV Piggyback:1350.3] Out: 2985 [Urine:455; Emesis/NG output:1400; Blood:700; Chest Tube:430] Intake/Output this shift: No intake/output data recorded.  General appearance: critically ill Neurologic: sedated Lungs: diminished breath sounds on right Wound: esmarck in place Anasarca   Lab Results: Recent Labs    10/13/2020 2132 10/21/2020 2133 10/23/2020 0154 10/14/2020 0234  WBC 9.7  --   --  13.3*  HGB 8.7*   < > 7.5* 9.2*  HCT 25.5*   < > 22.0* 26.3*  PLT 52*  --   --  41*   < > = values in this interval not displayed.   BMET:  Recent Labs    10/11/2020 2132 10/06/2020 2133 10/31/2020 0154 11/04/2020 0234  NA 142   < > 143 141  K 4.6   < > 5.0 5.1  CL 114*  --   --  113*  CO2 20*  --   --  20*  GLUCOSE 132*  --   --  135*  BUN 41*  --   --  44*  CREATININE 2.67*  --   --  2.80*  CALCIUM 6.1*  --   --  6.4*   < > = values in this interval not displayed.    PT/INR:  Recent Labs    10/31/2020 0234  LABPROT 23.1*  INR 2.1*   ABG    Component Value Date/Time   PHART 7.354 10/12/2020 0154   HCO3 22.7 10/19/2020 0154   TCO2 24 10/22/2020  0154   ACIDBASEDEF 3.0 (H) 10/15/2020 0154   O2SAT 91.0 10/07/2020 0234   CBG (last 3)  Recent Labs    10/26/2020 2130 10/23/2020 0021 10/27/2020 0351  GLUCAP 113* 132* 123*    Assessment/Plan: S/P Procedure(s) (LRB): REXPLORATION OF MEDIASTINUM (N/A) CIRCUIT CHANGE FOR ECMO (EXTRACORPOREAL MEMBRANE OXYGENATION) (N/A) TRANSESOPHAGEAL ECHOCARDIOGRAM (TEE) (N/A) APPLICATION OF CELL SAVER Remains critically ill with multiple organ system failure CV- on full ECMO support with Impella to vent LV  On Epi at 10 and norepi at 10  Amiodarone at 30  Unable to wean yesterday RESP_ CXR shows white out right lung  Dr. Pennie Banter to bronch  RENAL- creatinine continues to rise c/w acute renal failure  Will likely need CVVHD GI- dark blood from OG, transfused overnight ID- on Vanco and meropenem  WBC up likely due to pneumonia right lung  Continue antibiotics, bronch NEURO- sedated Thrombocytopenia persists   LOS: 11 days    [name] 10/23/2020

## 2020-11-05 NOTE — Op Note (Signed)
NAME: Veronica Swanson, Veronica Swanson MEDICAL RECORD NO: [id number] ACCOUNT NO: [id number] DATE OF BIRTH: 1945-05-05 FACILITY: [facility] LOCATION: [facility] PHYSICIAN: Salvatore Decent. Dorris Fetch, MD  Operative Report   DATE OF PROCEDURE: 10/20/2020  PREOPERATIVE DIAGNOSIS:  Bleeding on ECMO support.  POSTOPERATIVE DIAGNOSIS:  Bleeding on ECMO support.  PROCEDURE PERFORMED:  Exploration of the mediastinum for bleeding.  SURGEON:  Charlett Lango, MD  ANESTHESIA:  General.  FINDINGS:  Some bleeding around venous and arterial cannulas.  Bleeding from behind the heart.  Three sponges left in place along diaphragmatic surface of the pericardium.  Blood tracking along Blake drain into the left pleural space was evacuated.  CLINICAL NOTE:  The patient is a 75 year old woman who had cardiac arrest requiring CPR, after induction of general anesthesia. She had undergone emergent coronary artery bypass grafting, but failed to wean from bypass.  She required ECMO support as  well as placement of an Impella device to unload the left ventricle.  She has had issues with bleeding with an open chest since surgery and now has evidence of blood within the mediastinum impairing venous return.  Family was advised that we needed to  re-explore the chest.  The Ioban dressing overlying the chest wound was removed.  The chest was prepped and draped in the usual sterile fashion.  A timeout was performed.  The Esmarch dressing was removed.  There was clot in the mediastinum which was evacuated.  The chest  tubes were cleared of clot and there was also clot in the left pleural space, which was evacuated along with some bloody fluid.  Inspection of the cannula sites showed that the Rummel tourniquets on the arterial cannula had become slightly loose and these  were tightened and resecured.  There was no ongoing bleeding from that area.  MRDH was applied to the posterior aspect of the ventricle and the area was packed with three 4 x 18 gauze  sponges.  Chest was copiously irrigated with saline.  New Esmarch  dressing was sewn to the skin with a 2-0 Prolene suture. An Ioban dressing was applied over the chest wound.  The patient remained stable throughout the procedure.   SHW D: 10/08/2020 7:14:40 pm T: 10/14/2020 1:04:00 am  JOB: [id number]/ [id number]

## 2020-11-05 NOTE — Procedures (Signed)
Extubation Procedure Note  Patient Details:   Name: TEAUNA DUBACH DOB: 1945/09/19 MRN: [id number]   Airway Documentation:    Vent end date: 10/12/2020 Vent end time: 1800   Evaluation  Pt extubated per comfort care orders. Guss Bunde 10/08/2020, 6:00 PM

## 2020-11-05 NOTE — Anesthesia Postprocedure Evaluation (Signed)
Anesthesia Post Note  Patient: Veronica Swanson  Procedure(s) Performed: THROMBECTOMY Left  FEMORAL ARTERY with Bovine Patch. (Left: Leg Lower) LOWER EXTREMITY ANGIOGRAM (Left: Leg Lower)     Patient location during evaluation: ICU Anesthesia Type: General Level of consciousness: sedated and patient remains intubated per anesthesia plan Pain management: pain level controlled Vital Signs Assessment: post-procedure vital signs reviewed and stable Respiratory status: patient remains intubated per anesthesia plan Cardiovascular status: on ECMO, mulitple pressors. Postop Assessment: no apparent nausea or vomiting Anesthetic complications: no   No notable events documented.  Last Vitals:  Vitals:   10/14/2020 0545 10/28/2020 0600  BP:    Pulse:    Resp: (!) 0 (!) 0  Temp: 36.9 C 36.9 C  SpO2:      Last Pain:  Vitals:   10/12/2020 0000  TempSrc: Bladder  PainSc:                  Beryle Lathe

## 2020-11-05 NOTE — Progress Notes (Signed)
Patient ID: Lenora Boys, female   DOB: 1945-11-30, 75 y.o.   MRN: [id number]  Patient with 3 episodes of VT this morning, shocked x 1.  Amiodarone increased to 60 mg/hr.  K elevated, treated by Dr. Denese Killings.  More bleeding apparent at chest site and also in OG tube.  Hgb 5.4 on iStat.  Will give 2 more units PRBCs.   Impella position checked under echo, in too deep.  Withdrawn under echo guidance to 5.7 cm.   I am not sure that patient will tolerate CVVH at this point.  Will need to discuss with Dr. Dorris Fetch and family, consider comfort transition with intractable multisystem organ failure.   Marca Ancona 11/30/2020 10:44 AM

## 2020-11-05 NOTE — Progress Notes (Signed)
eLink Physician-Brief Progress Note Patient Name: BRECKIN ZAFAR DOB: 1945-06-11 MRN: [id number]   Date of Service  10/29/2020  HPI/Events of Note  Notified of calcium 6.4 Ionized calcium 0.99  eICU Interventions  Ordered calcium gluconate 2 g IVPB     Intervention Category Intermediate Interventions: Electrolyte abnormality - evaluation and management  Darl Pikes 10/16/2020, 3:51 AM

## 2020-11-05 NOTE — Procedures (Signed)
Bronchoscopy Procedure Note  Veronica Swanson  [id number]  1945/09/27  Date:10/06/2020  Time:8:51 AM   Provider Performing:Marilu Rylander   Procedure(s):  Initial Therapeutic Aspiration of Tracheobronchial Tree [phone]  Indication(s) Right lung collapse  Consent Unable to obtain consent due to emergent nature of procedure.  Anesthesia On Versed and hydromorphone infusions   Time Out Verified patient identification, verified procedure, site/side was marked, verified correct patient position, special equipment/implants available, medications/allergies/relevant history reviewed, required imaging and test results available.   Sterile Technique Usual hand hygiene, masks, gowns, and gloves were used   Procedure Description Bronchoscope advanced through endotracheal tube and into airway.  Airways were examined down to subsegmental level with findings noted below.   Following diagnostic evaluation, Therapeutic aspiration performed in right and left mainstem bronchi  Findings: Copious tenacious or blood-tinged secretions in proximal airways.  Saline instilled and secretions cleared distal airways without secretions but airways friable.   Complications/Tolerance Developed monomorphic VT during procedure resolved with single shock. Chest X-ray is needed post procedure.   EBL None   Specimen(s) None  .ras

## 2020-11-05 NOTE — Progress Notes (Signed)
1742-IV meds discontinued per comfort care orders. Family at bedside. Pt expired 1750. ECMO specialist and RN at bedside for family support.

## 2020-11-05 NOTE — Progress Notes (Signed)
ANTICOAGULATION CONSULT NOTE Pharmacy Consult for heparin Indication:  Impella 5.5 + ECMO VA  Allergies  Allergen Reactions   Penicillins Other (See Comments)    Dizzy     Patient Measurements: Height: '5\' 7"'  (170.2 cm) Weight: 91 kg (200 lb 9.9 oz) IBW/kg (Calculated) : 61.6 Heparin Dosing Weight: 78.7 kg   Vital Signs: Temp: 98.2 F (36.8 C) (09/27 1315) Temp Source: Bladder (09/27 1205) BP: 92/69 (09/27 1143) Pulse Rate: 46 (09/27 1200)  Labs: Recent Labs    10/28/2020 0403 11/03/2020 0436 10/18/2020 1830 10/26/2020 2006 11/03/2020 0420 10/07/2020 0423 10/20/2020 2132 10/10/2020 2133 26-Nov-2020 0234 Nov 26, 2020 0955 Nov 26, 2020 1428  HGB 9.1*   < > 9.4*   < > 7.6*   < > 8.7*   < > 9.2* 5.4* 7.1*  HCT 27.7*   < > 27.7*   < > 21.8*   < > 25.5*   < > 26.3* 16.0* 21.0*  PLT 41*  --  72*   < > 42*  --  52*  --  41*  --   --   APTT 44*  --  44*  --  80*  --   --   --  100*  --   --   LABPROT 16.8*  --  17.8*  --  20.1*  --   --   --  23.1*  --   --   INR 1.4*  --  1.5*  --  1.7*  --   --   --  2.1*  --   --   HEPARINUNFRC <0.10*  --   --   --  <0.10*  --   --   --  0.10*  --   --   CREATININE 1.74*  --  1.97*  --  2.26*  --  2.67*  --  2.80*  --   --    < > = values in this interval not displayed.     Estimated Creatinine Clearance: 19.8 mL/min (A) (by C-G formula based on SCr of 2.8 mg/dL (H)).   Medical History: Past Medical History:  Diagnosis Date   Carotid artery occlusion    Coronary artery disease    Hyperlipidemia    Hypertension    Myocardial infarction ([place]) 1997    Medications:  Scheduled:   sodium chloride   Intravenous Once   acetylcysteine  2 mL Nebulization Once   acetylcysteine  4 mL Nebulization Once   aspirin EC  325 mg Oral Daily   Or   aspirin  324 mg Per Tube Daily   bisacodyl  10 mg Oral Daily   Or   bisacodyl  10 mg Rectal Daily   chlorhexidine gluconate (MEDLINE KIT)  15 mL Mouth Rinse BID   Chlorhexidine Gluconate Cloth  6 each Topical Daily    docusate  100 mg Per Tube BID   feeding supplement (PROSource TF)  45 mL Per Tube QID   insulin aspart  0-24 Units Subcutaneous Q4H   mouth rinse  15 mL Mouth Rinse 10 times per day   pantoprazole (PROTONIX) IV  40 mg Intravenous Q12H   polyethylene glycol  17 g Per Tube BID   rosuvastatin  10 mg Oral Daily   sennosides  10 mL Oral BID   sodium bicarbonate       sodium chloride flush  10-40 mL Intracatheter Q12H   sodium chloride flush  3 mL Intravenous Q12H    Assessment: 45 yof who underwent CABG x4 complicated cardiac  arrest - now has impella 5.5 and on VA-ECMO. No AC PTA.  VA-ECMO + Impella 5.5 at P-3. Purge flow 7-7.5 mL/hr (350-375 units/hr of heparin from purge solution). Purge pressure stable 500s.  Systemic heparin 300 uts/hr with heparin total 650 uts/hr  HL 0.1 at goal but with open chest, oozing from chest wound and blood tinged stomach contents - no up titration  Dropping h/h, replace PRBC and FFP, low PLTC 30s s/p platelet transfusions  S/p OR last pm for washout / evaluation  Goal of Therapy:  Heparin level <0.3 units/ml Monitor platelets by anticoagulation protocol: Yes   Plan:  Continue heparin purge solution (50 units/mL) with Impella Continue systemic heparin infusion to 300 units/hr (total heparin amount will be ~650 units/hr with systemic + purge solution) - will keep at same rate until discussed with team on AM rounds Q12h heparin levels Monitor s/sx of bleeding closely   Bonnita Nasuti Pharm.D. CPP, BCPS Clinical Pharmacist [phone] 2020/11/25 3:52 PM    Please check AMION for all [facility] phone numbers After 10:00 PM, call [place] [phone]

## 2020-11-05 NOTE — Progress Notes (Addendum)
  Progress Note    10/19/2020 6:52 AM 1 Day Post-Op  Subjective:  intubated  Afebrile  Vitals:   10/30/2020 0545 10/31/2020 0600  BP:    Pulse:    Resp: (!) 0 (!) 0  Temp: 98.4 F (36.9 C) 98.4 F (36.9 C)  SpO2:      Physical Exam: Incisions:  left groin bandaged and clean Extremities:  no doppler signals bilateral feet.  Feet are cold to touch bilaterally.  Appears to have some mild mottling of distal portion of her feet.    CBC    Component Value Date/Time   WBC 13.3 (H) 10/06/2020 0234   RBC 3.05 (L) 10/20/2020 0234   HGB 9.2 (L) 10/27/2020 0234   HGB 11.2 12/09/2017 1142   HCT 26.3 (L) 10/27/2020 0234   HCT 33.7 (L) 12/09/2017 1142   PLT 41 (L) 11/03/2020 0234   PLT 319 12/09/2017 1142   MCV 86.2 11/02/2020 0234   MCV 82 12/09/2017 1142   MCH 30.2 10/12/2020 0234   MCHC 35.0 10/13/2020 0234   RDW 15.4 11/02/2020 0234   RDW 14.1 12/09/2017 1142   LYMPHSABS 0.3 (L) 10/12/2020 0127   LYMPHSABS 2.5 12/09/2017 1142   MONOABS 0.5 10/14/2020 0127   EOSABS 0.1 10/07/2020 0127   EOSABS 0.1 12/09/2017 1142   BASOSABS 0.0 10/22/2020 0127   BASOSABS 0.0 12/09/2017 1142    BMET    Component Value Date/Time   NA 141 10/22/2020 0234   NA 141 12/07/2019 1316   K 5.1 10/31/2020 0234   CL 113 (H) 10/06/2020 0234   CO2 20 (L) 10/25/2020 0234   GLUCOSE 135 (H) 10/18/2020 0234   BUN 44 (H) 10/30/2020 0234   BUN 20 12/07/2019 1316   CREATININE 2.80 (H) 10/07/2020 0234   CALCIUM 6.4 (LL) 10/12/2020 0234   GFRNONAA 17 (L) 10/12/2020 0234   GFRAA 48 (L) 12/07/2019 1316    INR    Component Value Date/Time   INR 2.1 (H) 10/18/2020 0234     Intake/Output Summary (Last 24 hours) at 10/16/2020 1610 Last data filed at 11/04/2020 0600 Gross per 24 hour  Intake 7939.83 ml  Output 2760 ml  Net 5179.83 ml     Assessment/Plan:  75 y.o. female is s/p:  1.  Left common femoral endarterectomy with bovine pericardial patch angioplasty 2.  Aortogram 3.  Left lower  extremity angiogram  2 Days Post Op   -pt without doppler signals in bilateral feet.   Pt on ecmo and requiring multiple pressors at this time.  AKI as her creatinine continues to rise-primary team initiating CVVH -thrombocytopenia with platelet count of 41k. -this pt would require bilateral femoral to distal bypass and is not a candidate for this as she is unstable.   Doreatha Massed, PA-C [facility] [phone] 10/24/2020 6:52 AM   I have independently interviewed and examined patient and agree with PA assessment and plan above.  Her feet only appear ischemic distally this is likely related to her chronic underlying vascular disease as well as her current hemodynamic instability.  She would require extensive vascular surgery bilaterally which I would not recommend in the current setting.  Pancho Rushing C. Randie Heinz, MD [facility] Office: [phone] Pager: [phone]

## 2020-11-05 NOTE — Progress Notes (Signed)
Assisted tele visit to patient with family member.  Hilarie Sinha P, RN  

## 2020-11-05 NOTE — Progress Notes (Signed)
Echocardiogram 2D Echocardiogram has been performed.  Warren Lacy Tre Sanker RDCS 10/12/2020, 10:53 AM  Impella position checked with Adam and Dr. Shirlee Latch

## 2020-11-05 NOTE — Progress Notes (Signed)
Patient ID: Veronica Swanson, female   DOB: Dec 28, 1945, 75 y.o.   MRN: [id number] Extracorporeal support note   ECLS support day: 6 Indication: Post-cardiotomy cardiogenic shock  Configuration: Central RA/Ao, Impella 5.5 LV vent  Pump speed: 2800 rpm Pump flow: 3.37 L/min  Sweep gas: 3.5 -> 4.5 -> 5.0 -> 6  Circuit change: 9/26 Circuit check: minimal amount thrombus Anticoagulant: Low dose systemic heparin, heparin in Impella purge Anticoagulation targets: N/A  Changes in support: None  Anticipated goals/duration of support: Wean to Impella.   Impella 5.5: P-1. Flow 0.8 L  No alarms. Waveforms ok.   Marca Ancona 11-05-20 8:04 AM

## 2020-11-05 NOTE — Progress Notes (Signed)
Patient ID: Veronica Swanson, female   DOB: 02-16-1945, 75 y.o.   MRN: [id number]    Advanced Heart Failure Rounding Note   Subjective:    - Chest washout 9/22.  - Left femoral artery occluded, left common femoral endarterectomy 9/25 - 9/26 chest washout with circuit change in the OR.  Unable to wean off VA ECMO onto Impella.   In OR on 9/26, unable to wean off ECMO onto Impella.  The LV is small with moderate LVH, unable to increase Impella beyond P4 when ECMO off and MAP dropped precipitously.   Started on low dose systemic heparin after occlusion of left common femoral artery.  Received 4 units PRBCs after OR yesterday.  She is GI bleeding, possible gastric stress ulcer.  She is on IV Protonix.    On epinephrine 10, NE 10, VP 0.04. NO 30 ppm running.   CXR with right lung whiteout, left lung more clear.  [place] suggests collapse.   No diuretics with bleeding and hypotension.  I/Os +4.9 L with only 455 cc UOP. Weight rising.   In atrial fibrillation rate 80s on amiodarone 30 mg/hr.   ECMO  Speed 2800 rpm Flow 3.37 L/min pVenous -5 (not accurate) DeltaP 9 Sweep 6  Impella P1, flow 0.8 L/min. No alarms  Scr 1.33 -> 1.5 -> 1.7 -> 2.26 Hgb 7.6 Platelets 42 LDH 261 -> 442 -> 698 (clot in leg) -> 970 (circuit changed prior day).    Objective:   Weight Range:  Vital Signs:   Temp:  [97.7 F (36.5 C)-98.4 F (36.9 C)] 98.4 F (36.9 C) (09/27 0700) Pulse Rate:  [25-99] 25 (09/27 0100) Resp:  [0-21] 0 (09/27 0700) SpO2:  [100 %] 100 % (09/27 0400) Arterial Line BP: (74-125)/(59-99) 95/72 (09/27 0700) FiO2 (%):  [40 %-100 %] 40 % (09/27 0348) Weight:  [91 kg] 91 kg (09/27 0500) Last BM Date: 10/10/2020  Weight change: Filed Weights   10/26/2020 0630 10/22/2020 0500 11/08/20 0500  Weight: 84.1 kg 90.1 kg 91 kg    Intake/Output:   Intake/Output Summary (Last 24 hours) at 08-Nov-2020 0749 Last data filed at 2020/11/08 0700 Gross per 24 hour  Intake 7930.29 ml  Output 2985  ml  Net 4945.29 ml     Physical Exam: General: NAD, sedated.  Neck: No JVD, no thyromegaly or thyroid nodule.  Lungs: Clear to auscultation bilaterally with normal respiratory effort. CV: Chest wall open. 1+ edema arms and legs.   Abdomen: Soft, nontender, no hepatosplenomegaly, mild distention.  Skin: Intact without lesions or rashes.  Neurologic: Sedated Extremities: Cool feet HEENT: Normal.   Telemetry: AF 80s Personally reviewed   Labs: Basic Metabolic Panel: Recent Labs  Lab 10/28/20 2331 10/28/20 2333 10/29/20 0427 10/29/20 0429 11/04/2020 0403 10/29/2020 0436 10/12/2020 1830 11/02/2020 2006 10/11/2020 0420 10/18/2020 0423 10/08/2020 2132 10/18/2020 2133 2020/11/08 0024 2020-11-08 0154 11/08/20 0234  NA 140   < > 140   < > 142   < > 143   < > 141   < > 142 143 144 143 141  K 3.6   < > 3.3*   < > 4.0   < > 4.5   < > 4.8   < > 4.6 4.5 4.8 5.0 5.1  CL 109  --  109   < > 113*  --  113*  --  112*  --  114*  --   --   --  113*  CO2 21*  --  22   < >  23  --  22  --  21*  --  20*  --   --   --  20*  GLUCOSE 189*  --  182*   < > 175*  --  184*  --  167*  --  132*  --   --   --  135*  BUN 20  --  20   < > 25*  --  31*  --  34*  --  41*  --   --   --  44*  CREATININE 1.60*  --  1.53*   < > 1.74*  --  1.97*  --  2.26*  --  2.67*  --   --   --  2.80*  CALCIUM 7.1*  --  7.1*   < > 6.8*  --  6.2*  --  6.3*  --  6.1*  --   --   --  6.4*  MG 2.0  --  2.0  --  1.8  --   --   --  1.9  --   --   --   --   --  2.1  PHOS  --   --   --   --   --   --   --   --  3.3  --   --   --   --   --   --    < > = values in this interval not displayed.    Liver Function Tests: Recent Labs  Lab 10/26/2020 0419 10/28/20 0212 10/29/20 0427 10/09/2020 0403 10/23/2020 0420  AST 37 43* 58* 113* 177*  ALT _0 33 40  ALKPHOS 21* 24* 28* 50 61  BILITOT 1.7* 3.0* 4.5* 3.6* 2.5*  PROT 3.8* 3.9* 4.2* 4.0* 3.3*  ALBUMIN 2.4* 2.3* 2.6* 2.0* 1.8*   No results for input(s): LIPASE, AMYLASE in the last 168 hours. No  results for input(s): AMMONIA in the last 168 hours.  CBC: Recent Labs  Lab 10/26/2020 2328 10/23/2020 2330 10/09/2020 1830 10/08/2020 2006 10/13/2020 0127 10/12/2020 0128 10/08/2020 0420 10/29/2020 0423 10/19/2020 2132 10/13/2020 2133 2020-11-22 0024 22-Nov-2020 0154 2020-11-22 0234  WBC 2.7*   < > 5.5  --  7.6  --  7.9  --  9.7  --   --   --  13.3*  NEUTROABS 2.1  --   --   --  6.5  --   --   --   --   --   --   --   --   HGB 7.3*   < > 9.4*   < > 8.6*   < > 7.6*   < > 8.7* 7.1* 8.2* 7.5* 9.2*  HCT 22.4*   < > 27.7*   < > 25.5*   < > 21.8*   < > 25.5* 21.0* 24.0* 22.0* 26.3*  MCV 82.7   < > 89.1  --  87.6  --  87.9  --  87.9  --   --   --  86.2  PLT 19*   < > 72*  --  50*  --  42*  --  52*  --   --   --  41*   < > = values in this interval not displayed.    Cardiac Enzymes: No results for input(s): CKTOTAL, CKMB, CKMBINDEX, TROPONINI in the last 168 hours.  BNP: BNP (last 3 results) Recent Labs    10/22/20 0954  BNP 1,154.2*    ProBNP (  last 3 results) No results for input(s): PROBNP in the last 8760 hours.    Other results:  Imaging: DG CHEST PORT 1 VIEW  Result Date: 10/15/2020 CLINICAL DATA:  Pulmonary edema EXAM: PORTABLE CHEST 1 VIEW COMPARISON:  Chest radiograph obtained earlier the same day FINDINGS: The endotracheal tube tip is in the midthoracic trachea. A right IJ Swan-Ganz catheter is in place terminating in the right pulmonary artery. The enteric catheter tip is off the field of view. A right upper extremity balloon pump projects over the left ventricle. ECMO cannulae are in stable position. A presumed mediastinal drain and left chest tube are in place. There is worsened aeration of the right lung which is now essentially completely opacified. There is confluent opacity in the left base. The left upper lung is relatively well aerated. There is no significant left effusion. There is no definite pneumothorax. The bones are stable. IMPRESSION: 1. Worsened aeration of the right lung  which is now essentially completely opacified. 2. Hazy opacity in the left base with relative sparing of the left upper lobe. Electronically Signed   By: Valetta Mole M.D.   On: 10/28/2020 20:57   DG CHEST PORT 1 VIEW  Result Date: 11/04/2020 CLINICAL DATA:  75 year old female on ECMO status post myocardial infarction, heart failure, cardiogenic shock. Postoperative day zero left lower extremity revascularization from acute thrombus superimposed on peripheral vascular disease. Postoperative day 5 status post CABG. EXAM: PORTABLE CHEST 1 VIEW COMPARISON:  Portable chest 10/26/2020 and earlier. FINDINGS: Portable AP semi upright view at 0524 hours. Intubated, endotracheal tube tip at the clavicles. Enteric tube courses to the abdomen, tip not included. Right chest ECMO catheter, right upper extremity approach cardiac balloon pump and 2 additional left chest probably ECMO related cannulas in place. Right IJ approach Swan-Ganz catheter, tip now convincingly at the level of the right heart. Bilateral chest tubes in place. Indistinct mediastinal contours, and increased paratracheal and lateral mediastinal density on the right which might reflect some recent operative mediastinal hematoma. But overall stable mediastinal size since 10/29/2020. Improved left lung ventilation from yesterday but patchy bilateral pulmonary opacity has increased since 10/29/2020, confluent in the right apex also. No pneumothorax. Indistinct pulmonary vasculature. Paucity of bowel gas. No acute osseous abnormality identified. IMPRESSION: 1. Extensive line and tube placement stable since yesterday. 2. Low lung volumes with improved left lung ventilation since yesterday but worsening bilateral ventilation compared to 10/29/2020 which may represent a combination of pulmonary edema and atelectasis. No pneumothorax identified. Electronically Signed   By: Genevie Ann M.D.   On: 10/26/2020 06:14   VAS [place] LOWER EXTREMITY ARTERIAL DUPLEX  Result Date:  10/09/2020 LOWER EXTREMITY ARTERIAL DUPLEX STUDY Patient Name:  Veronica Swanson  Date of Exam:   10/17/2020 Medical Rec #: [id number]         Accession #:    [id number] Date of Birth: 1945/09/07         Patient Gender: F Patient Age:   52 years Exam Location:  [facility] Procedure:      VAS [place] LOWER EXTREMITY ARTERIAL DUPLEX Referring Phys: Servando Snare --------------------------------------------------------------------------------  Indications: Peripheral artery disease, and Cold left extremity, no              Dopplerable/palpable pulses. High Risk Factors: Hypertension, hyperlipidemia, prior MI. 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  Current ABI: n/a Limitations: lines, ECMO, vent, Comparison Study: No prior left LEA Performing Technologist: Sharion Dove RVS  Examination Guidelines: A complete evaluation includes B-mode imaging, spectral Doppler, color Doppler, and power Doppler as needed of all accessible portions of each vessel. Bilateral testing is considered an integral part of a complete examination. Limited examinations for reoccurring indications may be performed as noted.   +----------+--------+-----+--------+-------------------+-----------------------+ LEFT      PSV cm/sRatioStenosisWaveform           Comments                +----------+--------+-----+--------+-------------------+-----------------------+ CFA Prox                       dampened monophasic                        +----------+--------+-----+--------+-------------------+-----------------------+ DFA                            dampened monophasic                        +----------+--------+-----+--------+-------------------+-----------------------+ SFA Prox                       dampened monophasic                        +----------+--------+-----+--------+-------------------+-----------------------+ SFA Mid                                           not visualized                                                             secondary to line       +----------+--------+-----+--------+-------------------+-----------------------+ SFA Distal                     dampened monophasic                        +----------+--------+-----+--------+-------------------+-----------------------+ POP Prox                       dampened monophasic                        +----------+--------+-----+--------+-------------------+-----------------------+ POP Distal                     dampened monophasic                        +----------+--------+-----+--------+-------------------+-----------------------+ ATA Distal                     absent                                     +----------+--------+-----+--------+-------------------+-----------------------+ PTA Prox                       absent                                     +----------+--------+-----+--------+-------------------+-----------------------+  PTA Mid                        absent                                     +----------+--------+-----+--------+-------------------+-----------------------+ PTA Distal                     absent                                     +----------+--------+-----+--------+-------------------+-----------------------+  Summary: Left: Severely dampened monophasic flow noted in the common femoral, femoral, profunda, and popliteal arteries. The posterior tibial and anterior tibial arterial flow is absent.  See table(s) above for measurements and observations. Electronically signed by Orlie Pollen on 10/13/2020 at 5:47:37 PM.    Final    HYBRID OR IMAGING ([place])  Result Date: 10/14/2020 There is no interpretation for this exam.  This order is for images obtained during a surgical procedure.  Please See "Surgeries" Tab for more information regarding the procedure.     Medications:     Scheduled Medications:  sodium chloride   Intravenous  Once   aspirin EC  325 mg Oral Daily   Or   aspirin  324 mg Per Tube Daily   bisacodyl  10 mg Oral Daily   Or   bisacodyl  10 mg Rectal Daily   chlorhexidine gluconate (MEDLINE KIT)  15 mL Mouth Rinse BID   Chlorhexidine Gluconate Cloth  6 each Topical Daily   docusate  100 mg Per Tube BID   feeding supplement (PROSource TF)  45 mL Per Tube QID   insulin aspart  0-24 Units Subcutaneous Q4H   mouth rinse  15 mL Mouth Rinse 10 times per day   pantoprazole (PROTONIX) IV  40 mg Intravenous Q12H   polyethylene glycol  17 g Per Tube BID   rosuvastatin  10 mg Oral Daily   sennosides  10 mL Oral BID   sodium chloride flush  10-40 mL Intracatheter Q12H   sodium chloride flush  3 mL Intravenous Q12H    Infusions:  sodium chloride     sodium chloride Stopped (10/29/20 0611)   sodium chloride 10 mL/hr at 2020-11-28 0700   amiodarone 30 mg/hr (Nov 28, 2020 0700)   dexmedetomidine (PRECEDEX) IV infusion Stopped (10/28/20 0748)   epinephrine 10 mcg/min (11-28-2020 0700)   feeding supplement (PIVOT 1.5 CAL) 1,000 mL (November 28, 2020 0215)   fluconazole (DIFLUCAN) IV Stopped (10/19/2020 1940)   heparin 50 units/mL (Impella PURGE) in dextrose 5 % 1000 mL bag     heparin 300 Units/hr (11/28/20 0700)   HYDROmorphone 4 mg/hr (11-28-2020 0700)   lactated ringers 10 mL/hr at 2020-11-28 0700   meropenem (MERREM) IV Stopped (10/16/2020 2253)   midazolam 6.5 mg/hr (Nov 28, 2020 0700)   milrinone Stopped (10/20/2020 1318)   norepinephrine (LEVOPHED) Adult infusion 10 mcg/min (28-Nov-2020 0700)   vancomycin Stopped (10/07/2020 1151)   vasopressin 0.03 Units/min (11-28-2020 0700)    PRN Medications: sodium chloride, sodium chloride, sodium chloride, artificial tears, HYDROmorphone, HYDROmorphone (DILAUDID) injection, HYDROmorphone (DILAUDID) injection, midazolam, ondansetron (ZOFRAN) IV   Assessment/Plan:   1. Shock: Cardiogenic/vasoplegic/hemorrhagic post-CABG with acute lung injury. Central cannulation for VA ECMO, ascending aorta  and RA.  Impella 5.5 for venting (ascending aorta). Unable to wean  from ECMO to Impella 5.5 in OR on 9/26, small ventricle with LVH and unable to increase beyond P4 on Impella with marked BP drop.  Currently on Impella at P1 for venting and full ECMO support.  Circuit changed in [place] on 9/26, functioning well.  She is on epinephrine 10, NE 10, VP 0.03, NO 30 ppm. Poor UOP with AKI, creatinine 2.8.  - Can wean pressors slowly/carefully.  - Did not tolerate volume removal with lasix, weight continues to rise.  Will ask renal to see today to initiate cautious CVVH.   - ECMO circuit stable but LDH still rising despite circuit change, may be due to ischemic leg, now on low dose heparin gtt with LCFA thrombus/ischemic left leg on 9/25.   - Impella at P1, flow 0.8, no alarms. Position ok by TEE on 9/26.  2. Acute blood loss Anemia/coagulopathy: Has had chest washout x 4.  Received 4 units PRBCs overnight.  - Transfuse to keep Hgb > 8 3. Acute hypoxemic respiratory failure: ALI with pulmonary edema and pulmonary hemorrhage.  CXR today with whiteout of right lung, looks like collapse.  - Lung protective ventilation per CCM.  - Bronchoscopy today.   4. Acute on chronic systolic CHF: Ischemic cardiomyopathy.  Pre-op echo with EF 35-40%.  Intra-op echo with LV EF 25% range but good RV function.  TEE 9/22 with RV small, moderate dysfunction and LV small, EF 20-25%.   TEE 9/27 looked similar.  - see above 5. AKI: Creatinine up to 2.8, poor UOP and did not do well with Lasix gtt.  - Will place HD catheter today and ask renal to see for CVVH.  6. Thrombocytopenia: Due to coagulopathy. Watch closely. Platelets 41K currently, stable.  7. FEN: Cortrak in place. Continue TFs 8. Neuro: Per RN report patient had purposeful movement on 9/24 9. PAF: Started 9/23. Rate controlled on amio gtt. Continue 10. PAD: Patient with acute left leg ischemia on 9/25, had left CFA endarterectomy.  Unable to doppler pulses in feet but has  severe chronic PAD as well.  - VVS following, needs aortobifemoral bypass but not candidate.     CRITICAL CARE Performed by: Loralie Champagne  Total critical care time: 45 minutes  Critical care time was exclusive of separately billable procedures and treating other patients.  Critical care was necessary to treat or prevent imminent or life-threatening deterioration.  Critical care was time spent personally by me (independent of midlevel providers or residents) on the following activities: development of treatment plan with patient and/or surrogate as well as nursing, discussions with consultants, evaluation of patient's response to treatment, examination of patient, obtaining history from patient or surrogate, ordering and performing treatments and interventions, ordering and review of laboratory studies, ordering and review of radiographic studies, pulse oximetry and re-evaluation of patient's condition.   Length of Stay: [place] 11-02-20, 7:49 AM  Advanced Heart Failure Team Pager [phone] (M-F; 7a - 4p)  Please contact [facility] for night-coverage after hours (4p -7a ) and weekends on [email or web address]

## 2020-11-05 NOTE — Op Note (Signed)
NAME: Veronica Swanson, Veronica Swanson MEDICAL RECORD NO: [id number] ACCOUNT NO: [id number] DATE OF BIRTH: Jun 19, 1945 FACILITY: [facility] LOCATION: [facility] PHYSICIAN: Salvatore Decent. Dorris Fetch, MD  Operative Report   DATE OF PROCEDURE: 10/26/2020  PREOPERATIVE DIAGNOSIS:  Cardiorespiratory failure following cardiac arrest and coronary bypass grafting, requiring ECMO and Impella support.  POSTOPERATIVE DIAGNOSIS:  Cardiorespiratory failure following cardiac arrest and coronary bypass grafting, requiring ECMO and Impella support.  PROCEDURE:  Reexploration of mediastinum, change out of ECMO circuit.  SURGEON:  Charlett Lango, MD  ASSISTANT:  Gershon Crane, PA  INTRAOPERATIVE CONSULTANTS: Marca Ancona, MD and Lynnell Catalan, MD  ANESTHESIA:  General.  FINDINGS:  Unable to wean successfully from ECMO due to under filling of left ventricle.  Left ventricular systolic function, ejection fraction estimated approximately 40%.  RV function appeared reasonable by ECMO, but unable to support circulation off  ECMO.   CLINICAL NOTE:  The patient is a 75 year old woman who had undergone salvage coronary artery bypass grafting, about a week prior.  She had a cardiac arrest on induction.  Coronary artery bypass grafting was completed, but the patient was unable to  wean from bypass.  She ultimately was brought out of the operating room on full ECMO support with an Impella pump in place in the left ventricle.  She now needs a washout of the mediastinum as well as change out of her ECMO circuit and the plan will be  to attempt to wean from ECMO and if unable, a new circuit will be connected.  The indications, risks, benefits, and alternatives were discussed in detail with the patient's family.  They accepted the risks and agreed to proceed.  DESCRIPTION OF PROCEDURE:  The patient was brought to the operating room on 10/17/2020.  She was sedated and intubated and on full support with ECMO and Impella. The Ioban dressing  overlying the sternal wound was removed.  The chest was prepped and  draped in the usual sterile fashion.  Dr. Renold Don of anesthesia performed transesophageal echocardiography.  Subsequently, Dr. Shirlee Latch also performed TEE as well. The initial evaluation with TEE showed the left ventricle was empty.  The Impella pump was  in good position.  The Esmarch dressing was removed.  There was blood-tinged serous fluid in the mediastinum, but no bleeding.  The sponges that had been placed at her last washout were removed and there was no bleeding from the back of the heart.  Sucker was advanced into both pleural spaces and serous pleural fluid was evacuated as well.  The ECMO flow was gradually decreased and the patient initially tolerated this well, maintaining a mean pressure.  Decision was made to attempt to wean  completely to Impella support, after bringing the flow down to 1.5 liters on ECMO, the lines were clamped. The new circuit was already prepared should reinstitution of ECMO be necessary.  The Impella power was turned up and flow rates were around 3  liters per minute.  The left ventricle remained completely empty on echocardiogram. The right ventricle appeared to have good motion, but did become slightly more distended.  Ultimately, the mean pressure dropped despite increasing epinephrine and  norepinephrine, so the decision was made to return to full ECMO support.  The new circuit was attached.  Care was taken to ensure that there was no air in the lines and ECMO was reinstituted.  The chest was copiously irrigated with saline.  Inspection  revealed no sites of bleeding within the chest cavity.  The patient stabilized hemodynamically and did  have pulsatile flow in the PA and the systemic arteries after reinstitution of ECMO.  The wound then was closed with an Esmarch dressing and an Ioban  dressing was placed over that.  The patient was transported from the operating room back to the surgical intensive  care unit in good condition.  All sponge, needle and instrument counts were correct at the end of the procedure.   SHW D: 10/15/2020 7:08:42 pm T: 10/28/2020 12:51:00 am  JOB: [id number]/ [id number]

## 2020-11-05 NOTE — Progress Notes (Signed)
At 1740 Cardiohelp was put into global override and clamped off by ECMO Specialist.  Impella was turned off by specialist.  Family at bedside; support given to family by RN and ECMO specialists.

## 2020-11-05 NOTE — Progress Notes (Signed)
1900-wasted 32ml of Versed. Verified with Earleen Newport, RN

## 2020-11-05 NOTE — Progress Notes (Addendum)
ECMO PROGRESS NOTE  NAME:  Veronica Swanson, MRN:  [id number], DOB:  January 09, 1946, LOS: 11 ADMISSION DATE:  10/20/2020, CONSULTATION DATE:  10/24/2020 REFERRING MD:  Roxan Hockey, CHIEF COMPLAINT:  VA-ECMO   HPI/course in hospital  75 year old woman who was placed on VA-ECMO support following CABGx4.  She presented 4 days ago with CCS class 4 angina and ruled in for NSTEMI. Echo showed EF of 35% with mild mitral regurgitation and an anterior wall regional wall motion abnormalities.  Coronary catheterization revealed triple-vessel disease.  LVEDP was normal at that time.  She was scheduled for urgent CABG.  This morning she began experiencing retrosternal chest pain around the time of arrival in the operating room.  She received a standard cardiac conduction with no hypotension.  However, she became progressively hypotensive and suffered a cardiac arrest likely due to a further ischemic event.  She was urgently cannulated for cardiopulmonary bypass.  She developed severe pulmonary edema and was very difficult to oxygenate.  She underwent SL bypass with reasonable targets.  She could not separate from bypass due to pulmonary edema and poor LV function.  She was converted to [facility] ECMO and a 5 5 Impella was placed and she was transferred to the ICU.  Interim history/subjective:   Underwent washout yesterday.  Improved LV and RV function but unable to successfully wean from ECMO.  Severe LVH with small LV cavity limits LV decompression with Impella.  Developed slow gastrointestinal bleeding.  Continues to have bloody gastric effluent.  She received 2 units of blood overnight.   Objective   Blood pressure (!) 120/93, pulse (!) 25, temperature 98.4 F (36.9 C), resp. rate (!) 0, height _0  (1.702 m), weight 91 kg, SpO2 100 %. PAP: (10-28)/(6-21) 12/6 CVP:  [7 mmHg] 7 mmHg  Vent Mode: PRVC FiO2 (%):  [40 %-100 %] 40 % Set Rate:  [20 bmp] 20 bmp Vt Set:  [340 mL] 340 mL PEEP:  [5 cmH20-8  cmH20] 5 cmH20 Plateau Pressure:  [22 cmH20-28 cmH20] 28 cmH20   Intake/Output Summary (Last 24 hours) at 2020/11/21 0808 Last data filed at 11-21-20 0700 Gross per 24 hour  Intake 7826.03 ml  Output 2885 ml  Net 4941.03 ml    Filed Weights   11/02/2020 0630 11/02/2020 0500 11/21/20 0500  Weight: 84.1 kg 90.1 kg 91 kg    Examination: Physical Exam Constitutional:      Interventions: She is sedated and intubated.  HENT:     Head:     Comments: OGT and ETT in place.  Eyes:     Conjunctiva/sclera: Conjunctivae normal.     Right eye: Chemosis present.     Left eye: Chemosis present.  Cardiovascular:     Rate and Rhythm: Regular rhythm.     Chest Wall: PMI is displaced (Apex beat now palpable).     Pulses: Intact distal pulses.     Heart sounds: Heart sounds are distant.    No friction rub.     Comments: Generalized edema.  Increased pulsatility. Pulmonary:     Effort: She is intubated.     Breath sounds: Examination of the right-upper field reveals decreased breath sounds. Examination of the right-middle field reveals decreased breath sounds. Examination of the right-lower field reveals decreased breath sounds. Decreased breath sounds present.     Comments: Tolerating PRVC tidal volume 340 mL with plateau pressure 30. Chest:     Comments: Open chest. Sero-sanguinous  drainage.  Less dressing saturation. Abdominal:     General: Abdomen is flat and scaphoid. Bowel sounds are decreased.     Palpations: Abdomen is soft.     Tenderness: There is no abdominal tenderness.     Comments: OG tube draining dark bloody effluent.  Genitourinary:    Comments: Foley in place with clear urine.  Skin:    General: Skin is warm.     Comments: Line sites are intact.  Neurological:     Mental Status: She is unresponsive.    POC ultrasound right chest shows complete collapse with no Assessment & Plan:  Critically ill due to cardiogenic and vasoplegic shock following CABG complicated by  perioperative cardiac arrest requiring inotropic, vasopressor mechanical cardiac support and VA ECMO  Ischemic cardiomyopathy due to recent acute non-ST elevation coronary syndrome Critically ill due to acute hypoxic respiratory failure requiring mechanical ventilation and ECMO support Acute kidney injury - worsening with oliguria Acute blood loss anemia Coagulopathy secondary to cardiopulmonary bypass  -Bronchoscopy today for right lung collapse - Continue vent support, VAP prevention bundle, sedation titrated to RASS -4 to -5 -Continue full support with addition of nitric oxide for RV support -We will convert introducer to triple-lumen dialysis catheter -New on full ventilatory support to facilitate rapid weaning from ECMO if necessary - Pre-post oxygenator ABG - may require circuit change when she goes to the OR. - Impella, inotrope and ECMO pump support to continue at current settings, s some signs of LV recovery on maximal support - Levophed titrating to MAP 65 for concurrent vasoplegia from ECMO circuit and arrest - significantly improved.  -Continue to follow leg perfusion which has improved with generally improved systemic perfusion - Nephrology to see for CRRT.   Increasing physiologic hits and already compromised elderly patient.  Window for successful recovery narrowing.  At multidisciplinary rounds have agreed to continue support through till Friday.  If no substantial improvement we will plan to withdraw ECMO and transition to comfort care at that time.  Daily Goals Checklist  Pain/Anxiety/Delirium protocol (if indicated): Dilaudid and Versed infusions - decrease while keeping RASS -4 VAP protocol (if indicated): Bundle in place Respiratory support goals: ECMO rest setting, keep plateau pressure less than 25, PC 15/8 Blood pressure target: MAP 70-80 DVT prophylaxis: Plan to start systemic [facility] tomorrow ? Nutritional status and feeding goals: Full dose tube feeds  GI prophylaxis:  PPI Fluid status goals: allow autoregulation.  Urinary catheter: Guide hemodynamic management Central lines: Right IJ MAC introducer, left radial art line Glucose control: Insulin [place]  Mobility/therapy needs: Bedrest Antibiotic de-escalation: Vancomycin and cefuroxime for open chest Home medication reconciliation: On hold Daily labs: Per ECMO protocol Code Status: Full code Family Communication: Updated yesterday Disposition: ICU  CRITICAL CARE Performed by: Kipp Brood  Total critical care time: 45 minutes  Critical care time was exclusive of separately billable procedures and treating other patients.  Critical care was necessary to treat or prevent imminent or life-threatening deterioration.  Critical care was time spent personally by me on the following activities: development of treatment plan with patient and/or surrogate as well as nursing, discussions with consultants, evaluation of patient's response to treatment, examination of patient, obtaining history from patient or surrogate, ordering and performing treatments and interventions, ordering and review of laboratory studies, ordering and review of radiographic studies, pulse oximetry, re-evaluation of patient's condition and participation in multidisciplinary rounds.  Kipp Brood, MD [facility] ICU Physician [place]  Pager: [phone] Mobile: [phone] After  hours: [phone].

## 2020-11-05 NOTE — Anesthesia Postprocedure Evaluation (Signed)
Anesthesia Post Note  Patient: Veronica Swanson  Procedure(s) Performed: REXPLORATION OF MEDIASTINUM CIRCUIT CHANGE FOR ECMO (EXTRACORPOREAL MEMBRANE OXYGENATION) TRANSESOPHAGEAL ECHOCARDIOGRAM (TEE) APPLICATION OF CELL SAVER     Patient location during evaluation: ICU Anesthesia Type: General Level of consciousness: sedated and patient remains intubated per anesthesia plan Pain management: pain level controlled Vital Signs Assessment: post-procedure vital signs reviewed and stable Respiratory status: patient on ventilator - see flowsheet for VS and patient remains intubated per anesthesia plan Cardiovascular status: stable Anesthetic complications: no   No notable events documented.  Last Vitals:  Vitals:   2020-11-14 0645 2020-11-14 0700  BP:    Pulse:    Resp: (!) 0 (!) 0  Temp: 36.9 C 36.9 C  SpO2:      Last Pain:  Vitals:   Nov 14, 2020 0000  TempSrc: Bladder  PainSc:                  Veronica Swanson

## 2020-11-05 DEATH — deceased

## 2020-11-08 ENCOUNTER — Ambulatory Visit: Payer: Medicare Other | Admitting: Cardiology

## 2020-12-06 NOTE — Death Summary Note (Signed)
      [address]       [place] [address]             [phone]      Death summary  Patient name: Veronica Swanson, Veronica Swanson Medical record number: [id number] CSN Number: [id number] Date of admission: 11-06-2020 Date of death: 2020-11-17  Admission diagnosis: Non-ST elevation MI  Hypertension  Hyperlipidemia  Carotid artery occlusion  Discharge diagnosis: Non-ST elevation MI due to left main coronary artery disease Patient Active Problem List   Diagnosis Date Noted   Cardiogenic shock (HCC)    HFrEF (heart failure with reduced ejection fraction) (HCC)    Non-ST elevation (NSTEMI) myocardial infarction (HCC) 11-06-2020   NSTEMI (non-ST elevated myocardial infarction) (HCC) 06-Nov-2020   Hyperlipidemia LDL goal <70 05/19/2018   Atherosclerosis of native coronary artery of native heart without angina pectoris 05/19/2018   Essential hypertension 12/09/2017   Type 2 diabetes mellitus without complication, without long-term current use of insulin (HCC) 12/09/2017   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  She presented with a 24-month history of chest pain.  She ruled in for non-ST elevation MI.  She underwent cardiac catheterization which revealed left main disease and was referred for coronary artery bypass grafting.  She was scheduled for elective bypass grafting on 10-Nov-2020.  On induction of general anesthesia she developed refractory hypotension and degenerated into pulseless electrical activity requiring initiation of CPR with both closed and open massage.  Her chest was emergently opened and she was placed on cardiopulmonary bypass she did have V. fib arrest.  Coronary bypass grafting x4 was performed emergently.  She failed to wean from bypass and required extracorporeal membrane oxygenation.  An  Impella device was placed to vent the left ventricle.  She remained dependent on the support devices and remained ventilated.  She required multiple procedures to washout clot from her chest which could not be closed.  She showed some signs of progress but an attempt to wean from ECMO on 9/262022 was unsuccessful.  She developed renal failure and decision was made not to initiate dialysis.  Her condition deteriorated with multiple organ system failure and after discussion with the family support was withdrawn on 17-Nov-2020.  She expired on 17-Nov-2020 at 1750.  Condition at discharge: Expired  Viviann Spare C. Dorris Fetch, MD [facility] [phone]
